# Patient Record
Sex: Male | Born: 1945 | Race: White | Hispanic: No | Marital: Married | Smoking: Never smoker
Health system: Southern US, Community
[De-identification: ages and names within clinical notes are randomized; demographics above are authoritative.]

## PROBLEM LIST (undated history)

## (undated) DIAGNOSIS — H409 Unspecified glaucoma: Secondary | ICD-10-CM

## (undated) DIAGNOSIS — C4490 Unspecified malignant neoplasm of skin, unspecified: Secondary | ICD-10-CM

## (undated) DIAGNOSIS — G4733 Obstructive sleep apnea (adult) (pediatric): Secondary | ICD-10-CM

## (undated) HISTORY — PX: VASECTOMY: SHX75

---

## 2013-12-07 ENCOUNTER — Emergency Department (HOSPITAL_COMMUNITY)
Admission: EM | Admit: 2013-12-07 | Discharge: 2013-12-07 | Disposition: A | Discharge status: Home / Self Care | Payer: Medicare HMO | Attending: Emergency Medicine | Admitting: Emergency Medicine

## 2013-12-07 ENCOUNTER — Encounter (HOSPITAL_COMMUNITY): Payer: Self-pay | Admitting: Emergency Medicine

## 2013-12-07 ENCOUNTER — Emergency Department (HOSPITAL_COMMUNITY): Payer: Medicare HMO

## 2013-12-07 ENCOUNTER — Emergency Department (HOSPITAL_COMMUNITY)
Admission: EM | Admit: 2013-12-07 | Discharge: 2013-12-07 | Disposition: A | Discharge status: Other Acute Inpatient Hospital | Payer: Medicare HMO | Source: Home / Self Care | Attending: Family Medicine | Admitting: Family Medicine

## 2013-12-07 DIAGNOSIS — S060X0A Concussion without loss of consciousness, initial encounter: Secondary | ICD-10-CM | POA: Insufficient documentation

## 2013-12-07 DIAGNOSIS — F29 Unspecified psychosis not due to a substance or known physiological condition: Secondary | ICD-10-CM | POA: Insufficient documentation

## 2013-12-07 DIAGNOSIS — S060X9A Concussion with loss of consciousness of unspecified duration, initial encounter: Secondary | ICD-10-CM

## 2013-12-07 DIAGNOSIS — Y929 Unspecified place or not applicable: Secondary | ICD-10-CM | POA: Insufficient documentation

## 2013-12-07 DIAGNOSIS — S0990XA Unspecified injury of head, initial encounter: Secondary | ICD-10-CM

## 2013-12-07 DIAGNOSIS — S060XAA Concussion with loss of consciousness status unknown, initial encounter: Secondary | ICD-10-CM

## 2013-12-07 DIAGNOSIS — Z7982 Long term (current) use of aspirin: Secondary | ICD-10-CM | POA: Insufficient documentation

## 2013-12-07 DIAGNOSIS — Z79899 Other long term (current) drug therapy: Secondary | ICD-10-CM | POA: Insufficient documentation

## 2013-12-07 DIAGNOSIS — Y939 Activity, unspecified: Secondary | ICD-10-CM | POA: Insufficient documentation

## 2013-12-07 DIAGNOSIS — W010XXA Fall on same level from slipping, tripping and stumbling without subsequent striking against object, initial encounter: Secondary | ICD-10-CM | POA: Insufficient documentation

## 2013-12-07 NOTE — ED Provider Notes (Signed)
CSN: [id number]     Arrival date & time 12/07/13  1132 History   First MD Initiated Contact with Patient 12/07/13 1154     Chief Complaint  Patient presents with  . Head Injury     (Consider location/radiation/quality/duration/timing/severity/associated sxs/prior Treatment) Patient is a 68 y.o. male presenting with head injury.  Head Injury Location:  Occipital Time since incident:  4 hours Mechanism of injury: fall   Mechanism of injury comment:  Slipped on ice Pain details:    Quality:  Dull   Severity:  Mild Chronicity:  New Relieved by:  Nothing Worsened by:  Nothing tried Associated symptoms comment:  Confusion, disorientation   History reviewed. No pertinent past medical history. History reviewed. No pertinent past surgical history. No family history on file. History  Substance Use Topics  . Smoking status: Never Smoker   . Smokeless tobacco: Not on file  . Alcohol Use: No    Review of Systems  All other systems reviewed and are negative.      Allergies  Review of patient's allergies indicates no known allergies.  Home Medications   Current Outpatient Rx  Name  Route  Sig  Dispense  Refill  . aspirin EC 81 MG tablet   Oral   Take 81 mg by mouth daily.         Marland Kitchen FIBER PO   Oral   Take 2 capsules by mouth daily.         . Glucosamine HCl (GLUCOSAMINE PO)   Oral   Take 2 capsules by mouth daily.         . Omega-3 Fatty Acids (FISH OIL PO)   Oral   Take 1 capsule by mouth daily.          BP 123/100  Pulse 62  Temp(Src) 98.3 F (36.8 C) (Oral)  Resp 18  Wt 277 lb 9.6 oz (125.919 kg)  SpO2 98% Physical Exam  Nursing note and vitals reviewed. Constitutional: He is oriented to person, place, and time. He appears well-developed and well-nourished. No distress.  HENT:  Head: Normocephalic and atraumatic. Head is without raccoon's eyes and without Battle's sign.  Nose: Nose normal.  Eyes: Conjunctivae and EOM are normal. Pupils are  equal, round, and reactive to light. No scleral icterus.  Neck: Normal range of motion and full passive range of motion without pain. No spinous process tenderness and no muscular tenderness present.  Cardiovascular: Normal rate, regular rhythm, normal heart sounds and intact distal pulses.   No murmur heard. Pulmonary/Chest: Effort normal and breath sounds normal. He has no rales. He exhibits no tenderness.  Abdominal: Soft. There is no tenderness. There is no rebound and no guarding.  Musculoskeletal: Normal range of motion. He exhibits no edema and no tenderness.       Thoracic back: He exhibits no tenderness and no bony tenderness.       Lumbar back: He exhibits no tenderness and no bony tenderness.  No evidence of trauma to extremities, except as noted.  2+ distal pulses.    Neurological: He is alert and oriented to person, place, and time. He has normal strength. GCS eye subscore is 4. GCS verbal subscore is 5. GCS motor subscore is 6.  Skin: Skin is warm and dry. No rash noted.  Psychiatric: He has a normal mood and affect.    ED Course  Procedures (including critical care time) Labs Review Labs Reviewed - No data to display Imaging Review Ct Cervical Spine Wo  Contrast  12/07/2013   CLINICAL DATA:  [place] and hit head  EXAM: CT HEAD WITHOUT CONTRAST  CT CERVICAL SPINE WITHOUT CONTRAST  TECHNIQUE: Multidetector CT imaging of the head and cervical spine was performed following the standard protocol without intravenous contrast. Multiplanar CT image reconstructions of the cervical spine were also generated.  COMPARISON:  None.  FINDINGS: CT HEAD FINDINGS  Mild atrophy. Negative for hydrocephalus. Negative for infarct, hemorrhage, or mass lesion.  Negative for skull fracture. Mucosal edema in the paranasal sinuses, Mild  CT CERVICAL SPINE FINDINGS  2 mm anterior slip C3-4 with disc and facet degeneration. Disc degeneration and spondylosis C5-6 and C6-7.  Negative for fracture.  IMPRESSION: No  acute intracranial abnormality.  Negative for cervical spine fracture.   Electronically Signed   By: Franchot Gallo M.D.   On: 12/07/2013 12:35  All radiology studies independently viewed by me.     EKG Interpretation  None  MDM   Final diagnoses:  Closed head injury  Concussion    68 yo male who slipped on ice and hit the back of his head.  Doesn't think he lost consciousness.  Was confused for a few hours after episode, but was able to walk around and drive home.  Back to normal normal.  Head and C spine CT negative.  Symptoms consistent with concussion.  Gave concussion precautions and return precautions.      Houston Siren III, MD 12/07/13 [phone]

## 2013-12-07 NOTE — ED Notes (Signed)
Reportedly slipped on ice and struck occipital area of head ~9:30. Arrived home tearful, asking wife "were do we live", could not remember her name (remembered ' pet' name for his wife), had been driving around for a while trying to find house. Abrasion to occipital area, no blood or fluid from ears or nose, EOM equal, grips equal, no soiling of garments

## 2013-12-07 NOTE — ED Provider Notes (Signed)
CSN: [id number]     Arrival date & time 12/07/13  1046 History   First MD Initiated Contact with Patient 12/07/13 1106     Chief Complaint  Patient presents with  . Head Injury   (Consider location/radiation/quality/duration/timing/severity/associated sxs/prior Treatment) HPI Comments: Patient brought to [facility] by his wife who provides some of the history as patient still mildly confused about details following his injury. Patient recalls going to their storage unit this morning and while he was there he slipped on patch of ice and "landed on my head." States he then remembers "driving around a while" because he could not find his house. States he was nauseated and upset. Wife reports he arrived home crying and could not recall her name or the name of his street, asking her "where do we live?" Has abrasion to occiput and some moderate neck discomfort. Last tetanus booster 2 years ago. PCP: Dr. Hulan Fess Denies any additional injury. Does take a daily aspirin, but no additional medications.  Patient is a 68 y.o. male presenting with head injury. The history is provided by the patient and the spouse.  Head Injury Location:  Occipital Associated symptoms: headache, nausea and neck pain     History reviewed. No pertinent past medical history. History reviewed. No pertinent past surgical history. History reviewed. No pertinent family history. History  Substance Use Topics  . Smoking status: Never Smoker   . Smokeless tobacco: Not on file  . Alcohol Use: No    Review of Systems  Constitutional: Negative.   Eyes: Negative for visual disturbance.  Respiratory: Negative.   Cardiovascular: Negative.   Gastrointestinal: Positive for nausea.  Endocrine: Negative.   Genitourinary: Negative.   Musculoskeletal: Positive for neck pain.  Skin:       +scalp abrasion  Neurological: Positive for headaches.  Psychiatric/Behavioral: Positive for confusion.    Allergies  Review of patient's  allergies indicates no known allergies.  Home Medications  No current outpatient prescriptions on file. BP 125/75  Pulse 56  Temp(Src) 97.4 F (36.3 C) (Oral)  Resp 20  SpO2 98% Physical Exam  Nursing note and vitals reviewed. Constitutional: He appears well-developed and well-nourished. No distress.  HENT:  Head: Normocephalic. Head is with abrasion.    Right Ear: External ear normal.  Left Ear: External ear normal.  Nose: Nose normal.  Mouth/Throat: Uvula is midline, oropharynx is clear and moist and mucous membranes are normal.  Eyes: Conjunctivae and EOM are normal. Pupils are equal, round, and reactive to light.  Neck: Trachea normal and phonation normal. Neck supple. Spinous process tenderness and muscular tenderness present.    Cardiovascular: Regular rhythm.  Bradycardia present.   Pulmonary/Chest: Effort normal and breath sounds normal.  Musculoskeletal: Normal range of motion.  Neurological: He is alert. He has normal strength. No cranial nerve deficit or sensory deficit. Coordination and gait normal. GCS eye subscore is 4. GCS verbal subscore is 5. GCS motor subscore is 6.  Skin: Skin is warm and dry.  Psychiatric: He has a normal mood and affect. His behavior is normal.    ED Course  Procedures (including critical care time) Labs Review Labs Reviewed - No data to display Imaging Review No results found.   MDM   1. Concussion   Transferred to [facility] ER in cervical collar for probable imaging of head and c-spine.    [place], [place] 12/07/13 1134

## 2013-12-07 NOTE — Discharge Instructions (Signed)
Concussion, Adult °A concussion, or closed-head injury, is a brain injury caused by a direct blow to the head or by a quick and sudden movement (jolt) of the head or neck. Concussions are usually not life-threatening. Even so, the effects of a concussion can be serious. If you have had a concussion before, you are more likely to experience concussion-like symptoms after a direct blow to the head.  °CAUSES  °· Direct blow to the head, such as from running into another player during a soccer game, being hit in a fight, or hitting your head on a hard surface. °· A jolt of the head or neck that causes the brain to move back and forth inside the skull, such as in a car crash. °SIGNS AND SYMPTOMS  °The signs of a concussion can be hard to notice. Early on, they may be missed by you, family members, and health care providers. You may look fine but act or feel differently. °Symptoms are usually temporary, but they may last for days, weeks, or even longer. Some symptoms may appear right away while others may not show up for hours or days. Every head injury is different. Symptoms include:  °· Mild to moderate headaches that will not go away. °· A feeling of pressure inside your head.  °· Having more trouble than usual:   °· Learning or remembering things you have heard. °· Answering questions.  °· Paying attention or concentrating.   °· Organizing daily tasks.   °· Making decisions and solving problems.   °· Slowness in thinking, acting or reacting, speaking, or reading.   °· Getting lost or being easily confused.   °· Feeling tired all the time or lacking energy (fatigued).   °· Feeling drowsy.   °· Sleep disturbances.   °· Sleeping more than usual.   °· Sleeping less than usual.   °· Trouble falling asleep.   °· Trouble sleeping (insomnia).   °· Loss of balance or feeling lightheaded or dizzy.   °· Nausea or vomiting.   °· Numbness or tingling.   °· Increased sensitivity to:   °· Sounds.   °· Lights.   °· Distractions.    °· Vision problems or eyes that tire easily.   °· Diminished sense of taste or smell.   °· Ringing in the ears.   °· Mood changes such as feeling sad or anxious.   °· Becoming easily irritated or angry for little or no reason.   °· Lack of motivation. °· Seeing or hearing things other people do not see or hear (hallucinations). °DIAGNOSIS  °Your health care provider can usually diagnose a concussion based on a description of your injury and symptoms. He or she will ask whether you passed out (lost consciousness) and whether you are having trouble remembering events that happened right before and during your injury.  °Your evaluation might include:  °· A brain scan to look for signs of injury to the brain. Even if the test shows no injury, you may still have a concussion.   °· Blood tests to be sure other problems are not present. °TREATMENT  °· Concussions are usually treated in an emergency department, in urgent care, or at a clinic. You may need to stay in the hospital overnight for further treatment.   °· Tell your health care provider if you are taking any medicines, including prescription medicines, over-the-counter medicines, and natural remedies. Some medicines, such as blood thinners (anticoagulants) and aspirin, may increase the chance of complications. Also tell your health care provider whether you have had alcohol or are taking illegal drugs. This information may affect treatment. °· Your health care provider will send you   home with important instructions to follow.  How fast you will recover from a concussion depends on many factors. These factors include how severe your concussion is, what part of your brain was injured, your age, and how healthy you were before the concussion.  Most people with mild injuries recover fully. Recovery can take time. In general, recovery is slower in older persons. Also, persons who have had a concussion in the past or have other medical problems may find that it  takes longer to recover from their current injury. HOME CARE INSTRUCTIONS  General Instructions  Carefully follow the directions your health care provider gave you.  Only take over-the-counter or prescription medicines for pain, discomfort, or fever as directed by your health care provider.  Take only those medicines that your health care provider has approved.  Do not drink alcohol until your health care provider says you are well enough to do so. Alcohol and certain other drugs may slow your recovery and can put you at risk of further injury.  If it is harder than usual to remember things, write them down.  If you are easily distracted, try to do one thing at a time. For example, do not try to watch TV while fixing dinner.  Talk with family members or close friends when making important decisions.  Keep all follow-up appointments. Repeated evaluation of your symptoms is recommended for your recovery.  Watch your symptoms and tell others to do the same. Complications sometimes occur after a concussion. Older adults with a brain injury may have a higher risk of serious complications such as of a blood clot on the brain.  Tell your teachers, school nurse, school counselor, coach, athletic trainer, or work Freight forwarder about your injury, symptoms, and restrictions. Tell them about what you can or cannot do. They should watch for:   Increased problems with attention or concentration.   Increased difficulty remembering or learning new information.   Increased time needed to complete tasks or assignments.   Increased irritability or decreased ability to cope with stress.   Increased symptoms.   Rest. Rest helps the brain to heal. Make sure you:  Get plenty of sleep at night. Avoid staying up late at night.  Keep the same bedtime hours on weekends and weekdays.  Rest during the day. Take daytime naps or rest breaks when you feel tired.  Limit activities that require a lot of  thought or concentration. These includes   Doing homework or job-related work.   Watching TV.   Working on the computer.  Avoid any situation where there is potential for another head injury (football, hockey, soccer, basketball, martial arts, downhill snow sports and horseback riding). Your condition will get worse every time you experience a concussion. You should avoid these activities until you are evaluated by the appropriate follow-up caregivers. Returning To Your Regular Activities You will need to return to your normal activities slowly, not all at once. You must give your body and brain enough time for recovery.  Do not return to sports or other athletic activities until your health care provider tells you it is safe to do so.  Ask your health care provider when you can drive, ride a bicycle, or operate heavy machinery. Your ability to react may be slower after a brain injury. Never do these activities if you are dizzy.  Ask your health care provider about when you can return to work or school. Preventing Another Concussion It is very important to avoid another  brain injury, especially before you have recovered. In rare cases, another injury can lead to permanent brain damage, brain swelling, or death. The risk of this is greatest during the first 7 10 days after a head injury. Avoid injuries by:   Wearing a seat belt when riding in a car.   Drinking alcohol only in moderation.   Wearing a helmet when biking, skiing, skateboarding, skating, or doing similar activities.  Avoiding activities that could lead to a second concussion, such as contact or recreational sports, until your health care provider says it is OK.  Taking safety measures in your home.   Remove clutter and tripping hazards from floors and stairways.   Use grab bars in bathrooms and handrails by stairs.   Place non-slip mats on floors and in bathtubs.   Improve lighting in dim areas. SEEK MEDICAL  CARE IF:   You have increased problems paying attention or concentrating.   You have increased difficulty remembering or learning new information.   You need more time to complete tasks or assignments than before.   You have increased irritability or decreased ability to cope with stress.  You have more symptoms than before. Seek medical care if you have any of the following symptoms for more than 2 weeks after your injury:   Lasting (chronic) headaches.   Dizziness or balance problems.   Nausea.  Vision problems.   Increased sensitivity to noise or light.   Depression or mood swings.   Anxiety or irritability.   Memory problems.   Difficulty concentrating or paying attention.   Sleep problems.   Feeling tired all the time. SEEK IMMEDIATE MEDICAL CARE IF:   You have severe or worsening headaches. These may be a sign of a blood clot in the brain.  You have weakness (even if only in one hand, leg, or part of the face).  You have numbness.  You have decreased coordination.   You vomit repeatedly.  You have increased sleepiness.  One pupil is larger than the other.   You have convulsions.   You have slurred speech.   You have increased confusion. This may be a sign of a blood clot in the brain.  You have increased restlessness, agitation, or irritability.   You are unable to recognize people or places.   You have neck pain.   It is difficult to wake you up.   You have unusual behavior changes.   You lose consciousness. MAKE SURE YOU:   Understand these instructions.  Will watch your condition.  Will get help right away if you are not doing well or get worse. Document Released: 12/18/2003 Document Revised: 05/30/2013 Document Reviewed: 04/19/2013 [facility] Patient Information 2014 [place], [place].  Head Injury, Adult You have received a head injury. It does not appear serious at this time. Headaches and vomiting are common  following head injury. It should be easy to awaken from sleeping. Sometimes it is necessary for you to stay in the emergency department for a while for observation. Sometimes admission to the hospital may be needed. After injuries such as yours, most problems occur within the first 24 hours, but side effects may occur up to 7 10 days after the injury. It is important for you to carefully monitor your condition and contact your health care provider or seek immediate medical care if there is a change in your condition. WHAT ARE THE TYPES OF HEAD INJURIES? Head injuries can be as minor as a bump. Some head injuries can  be more severe. More severe head injuries include:  A jarring injury to the brain (concussion).  A bruise of the brain (contusion). This mean there is bleeding in the brain that can cause swelling.  A cracked skull (skull fracture).  Bleeding in the brain that collects, clots, and forms a bump (hematoma). WHAT CAUSES A HEAD INJURY? A serious head injury is most likely to happen to someone who is in a car wreck and is not wearing a seat belt. Other causes of major head injuries include bicycle or motorcycle accidents, sports injuries, and falls. HOW ARE HEAD INJURIES DIAGNOSED? A complete history of the event leading to the injury and your current symptoms will be helpful in diagnosing head injuries. Many times, pictures of the brain, such as CT or MRI are needed to see the extent of the injury. Often, an overnight hospital stay is necessary for observation.  WHEN SHOULD I SEEK IMMEDIATE MEDICAL CARE?  You should get help right away if:  You have confusion or drowsiness.  You feel sick to your stomach (nauseous) or have continued, forceful vomiting.  You have dizziness or unsteadiness that is getting worse.  You have severe, continued headaches not relieved by medicine. Only take over-the-counter or prescription medicines for pain, fever, or discomfort as directed by your health  care provider.  You do not have normal function of the arms or legs or are unable to walk.  You notice changes in the black spots in the center of the colored part of your eye (pupil).  You have a clear or bloody fluid coming from your nose or ears.  You have a loss of vision. During the next 24 hours after the injury, you must stay with someone who can watch you for the warning signs. This person should contact local emergency services (911 in the [place] if you have seizures, you become unconscious, or you are unable to wake up. HOW CAN I PREVENT A HEAD INJURY IN THE FUTURE? The most important factor for preventing major head injuries is avoiding motor vehicle accidents. To minimize the potential for damage to your head, it is crucial to wear seat belts while riding in motor vehicles. Wearing helmets while bike riding and playing collision sports (like football) is also helpful. Also, avoiding dangerous activities around the house will further help reduce your risk of head injury.  WHEN CAN I RETURN TO NORMAL ACTIVITIES AND ATHLETICS? You should be reevaluated by your health care provider before returning to these activities. If you have any of the following symptoms, you should not return to activities or contact sports until 1 week after the symptoms have stopped:  Persistent headache.  Dizziness or vertigo.  Poor attention and concentration.  Confusion.  Memory problems.  Nausea or vomiting.  Fatigue or tire easily.  Irritability.  Intolerant of bright lights or loud noises.  Anxiety or depression.  Disturbed sleep. MAKE SURE YOU:   Understand these instructions.  Will watch your condition.  Will get help right away if you are not doing well or get worse. Document Released: 09/27/2005 Document Revised: 07/18/2013 Document Reviewed: 06/04/2013 [facility] Patient Information 2014 [place].

## 2013-12-07 NOTE — ED Notes (Signed)
Pt sent here for altered mental status after slipping on ice and hitting back of head.  Pt some how was able to drive back home even though he asked wife what street they lived on and could not remember wife's names.  PT now able to state wife's name correctly.  Pt c/o neck pain only at this time.

## 2013-12-07 NOTE — ED Notes (Signed)
Patient transported to CT 

## 2013-12-09 NOTE — ED Provider Notes (Signed)
Medical screening examination/treatment/procedure(s) were performed by a resident physician or non-physician practitioner and as the supervising physician I was immediately available for consultation/collaboration.  Tiannah Greenly, MD    Maliah Pyles S Crystle Carelli, MD 12/09/13 0838 

## 2016-02-09 DIAGNOSIS — M150 Primary generalized (osteo)arthritis: Secondary | ICD-10-CM | POA: Diagnosis not present

## 2016-02-09 DIAGNOSIS — Z0000 Encounter for general adult medical examination without abnormal findings: Secondary | ICD-10-CM | POA: Diagnosis not present

## 2016-02-09 DIAGNOSIS — Z23 Encounter for immunization: Secondary | ICD-10-CM | POA: Diagnosis not present

## 2016-02-09 DIAGNOSIS — E7800 Pure hypercholesterolemia, unspecified: Secondary | ICD-10-CM | POA: Diagnosis not present

## 2016-02-09 DIAGNOSIS — N529 Male erectile dysfunction, unspecified: Secondary | ICD-10-CM | POA: Diagnosis not present

## 2016-02-25 DIAGNOSIS — H903 Sensorineural hearing loss, bilateral: Secondary | ICD-10-CM | POA: Diagnosis not present

## 2016-03-29 DIAGNOSIS — H2513 Age-related nuclear cataract, bilateral: Secondary | ICD-10-CM | POA: Diagnosis not present

## 2016-03-29 DIAGNOSIS — H5213 Myopia, bilateral: Secondary | ICD-10-CM | POA: Diagnosis not present

## 2016-03-29 DIAGNOSIS — H524 Presbyopia: Secondary | ICD-10-CM | POA: Diagnosis not present

## 2016-03-29 DIAGNOSIS — H43813 Vitreous degeneration, bilateral: Secondary | ICD-10-CM | POA: Diagnosis not present

## 2016-04-07 DIAGNOSIS — H903 Sensorineural hearing loss, bilateral: Secondary | ICD-10-CM | POA: Diagnosis not present

## 2016-04-20 DIAGNOSIS — D1801 Hemangioma of skin and subcutaneous tissue: Secondary | ICD-10-CM | POA: Diagnosis not present

## 2016-04-20 DIAGNOSIS — L570 Actinic keratosis: Secondary | ICD-10-CM | POA: Diagnosis not present

## 2016-04-20 DIAGNOSIS — C44319 Basal cell carcinoma of skin of other parts of face: Secondary | ICD-10-CM | POA: Diagnosis not present

## 2016-04-20 DIAGNOSIS — D485 Neoplasm of uncertain behavior of skin: Secondary | ICD-10-CM | POA: Diagnosis not present

## 2016-04-20 DIAGNOSIS — L821 Other seborrheic keratosis: Secondary | ICD-10-CM | POA: Diagnosis not present

## 2016-06-17 DIAGNOSIS — M545 Low back pain: Secondary | ICD-10-CM | POA: Diagnosis not present

## 2016-06-17 DIAGNOSIS — M9903 Segmental and somatic dysfunction of lumbar region: Secondary | ICD-10-CM | POA: Diagnosis not present

## 2016-07-23 DIAGNOSIS — M545 Low back pain: Secondary | ICD-10-CM | POA: Diagnosis not present

## 2016-07-23 DIAGNOSIS — M9903 Segmental and somatic dysfunction of lumbar region: Secondary | ICD-10-CM | POA: Diagnosis not present

## 2016-08-30 DIAGNOSIS — M545 Low back pain: Secondary | ICD-10-CM | POA: Diagnosis not present

## 2016-08-30 DIAGNOSIS — M9903 Segmental and somatic dysfunction of lumbar region: Secondary | ICD-10-CM | POA: Diagnosis not present

## 2016-09-21 DIAGNOSIS — M545 Low back pain: Secondary | ICD-10-CM | POA: Diagnosis not present

## 2016-09-21 DIAGNOSIS — M9903 Segmental and somatic dysfunction of lumbar region: Secondary | ICD-10-CM | POA: Diagnosis not present

## 2016-10-18 DIAGNOSIS — M9903 Segmental and somatic dysfunction of lumbar region: Secondary | ICD-10-CM | POA: Diagnosis not present

## 2016-10-18 DIAGNOSIS — M545 Low back pain: Secondary | ICD-10-CM | POA: Diagnosis not present

## 2016-11-18 DIAGNOSIS — M160 Bilateral primary osteoarthritis of hip: Secondary | ICD-10-CM | POA: Diagnosis not present

## 2016-11-18 DIAGNOSIS — M7062 Trochanteric bursitis, left hip: Secondary | ICD-10-CM | POA: Diagnosis not present

## 2016-11-18 DIAGNOSIS — M25552 Pain in left hip: Secondary | ICD-10-CM | POA: Diagnosis not present

## 2016-12-17 DIAGNOSIS — M9903 Segmental and somatic dysfunction of lumbar region: Secondary | ICD-10-CM | POA: Diagnosis not present

## 2016-12-17 DIAGNOSIS — M545 Low back pain: Secondary | ICD-10-CM | POA: Diagnosis not present

## 2017-01-24 DIAGNOSIS — M9903 Segmental and somatic dysfunction of lumbar region: Secondary | ICD-10-CM | POA: Diagnosis not present

## 2017-01-24 DIAGNOSIS — M545 Low back pain: Secondary | ICD-10-CM | POA: Diagnosis not present

## 2017-02-14 DIAGNOSIS — E669 Obesity, unspecified: Secondary | ICD-10-CM | POA: Diagnosis not present

## 2017-02-14 DIAGNOSIS — Z1211 Encounter for screening for malignant neoplasm of colon: Secondary | ICD-10-CM | POA: Diagnosis not present

## 2017-02-14 DIAGNOSIS — Z0000 Encounter for general adult medical examination without abnormal findings: Secondary | ICD-10-CM | POA: Diagnosis not present

## 2017-02-14 DIAGNOSIS — Z23 Encounter for immunization: Secondary | ICD-10-CM | POA: Diagnosis not present

## 2017-02-14 DIAGNOSIS — E7800 Pure hypercholesterolemia, unspecified: Secondary | ICD-10-CM | POA: Diagnosis not present

## 2017-02-14 DIAGNOSIS — G4719 Other hypersomnia: Secondary | ICD-10-CM | POA: Diagnosis not present

## 2017-02-14 DIAGNOSIS — M25552 Pain in left hip: Secondary | ICD-10-CM | POA: Diagnosis not present

## 2017-02-14 DIAGNOSIS — Z125 Encounter for screening for malignant neoplasm of prostate: Secondary | ICD-10-CM | POA: Diagnosis not present

## 2017-02-28 DIAGNOSIS — M545 Low back pain: Secondary | ICD-10-CM | POA: Diagnosis not present

## 2017-02-28 DIAGNOSIS — M9903 Segmental and somatic dysfunction of lumbar region: Secondary | ICD-10-CM | POA: Diagnosis not present

## 2017-03-01 ENCOUNTER — Encounter (HOSPITAL_BASED_OUTPATIENT_CLINIC_OR_DEPARTMENT_OTHER): Payer: Self-pay

## 2017-03-01 DIAGNOSIS — R0683 Snoring: Secondary | ICD-10-CM

## 2017-03-01 DIAGNOSIS — G4710 Hypersomnia, unspecified: Secondary | ICD-10-CM

## 2017-04-01 DIAGNOSIS — H524 Presbyopia: Secondary | ICD-10-CM | POA: Diagnosis not present

## 2017-04-01 DIAGNOSIS — H40013 Open angle with borderline findings, low risk, bilateral: Secondary | ICD-10-CM | POA: Diagnosis not present

## 2017-04-01 DIAGNOSIS — H2513 Age-related nuclear cataract, bilateral: Secondary | ICD-10-CM | POA: Diagnosis not present

## 2017-04-01 DIAGNOSIS — H43813 Vitreous degeneration, bilateral: Secondary | ICD-10-CM | POA: Diagnosis not present

## 2017-04-06 ENCOUNTER — Ambulatory Visit (HOSPITAL_BASED_OUTPATIENT_CLINIC_OR_DEPARTMENT_OTHER): Payer: PPO | Attending: Family Medicine | Admitting: Internal Medicine

## 2017-04-06 VITALS — Ht 74.0 in | Wt 279.0 lb

## 2017-04-06 DIAGNOSIS — G4733 Obstructive sleep apnea (adult) (pediatric): Secondary | ICD-10-CM

## 2017-04-06 DIAGNOSIS — G4710 Hypersomnia, unspecified: Secondary | ICD-10-CM

## 2017-04-06 DIAGNOSIS — R0683 Snoring: Secondary | ICD-10-CM

## 2017-04-13 DIAGNOSIS — R0683 Snoring: Secondary | ICD-10-CM | POA: Diagnosis not present

## 2017-04-13 NOTE — Procedures (Signed)
   Patient Name: Javier Peterson, Javier Peterson Date: 04/06/2017 Gender: Male D.O.B: 1946-08-20 Age (years): 70 Referring Provider: Chesley Noon Height (inches): 74 Interpreting Physician: Baird Lyons MD, ABSM Weight (lbs): 279 RPSGT: Laren Everts BMI: 36 MRN: [id number] Neck Size: 19.00 CLINICAL INFORMATION Sleep Study Type: NPSG  Indication for sleep study: Excessive Daytime Sleepiness, Obesity, Snoring, Witnessed Apneas  Epworth Sleepiness Score: 13  SLEEP STUDY TECHNIQUE As per the AASM Manual for the Scoring of Sleep and Associated Events v2.3 (April 2016) with a hypopnea requiring 4% desaturations.  The channels recorded and monitored were frontal, central and occipital EEG, electrooculogram (EOG), submentalis EMG (chin), nasal and oral airflow, thoracic and abdominal wall motion, anterior tibialis EMG, snore microphone, electrocardiogram, and pulse oximetry.  MEDICATIONS Medications self-administered by patient taken the night of the study : GLUCOSAMINE, ASPIRIN, [place] The study was initiated at 10:03:39 PM and ended at 4:34:39 AM.  Sleep onset time was 55.1 minutes and the sleep efficiency was 72.3%. The total sleep time was 282.5 minutes.  Stage REM latency was 132.5 minutes.  The patient spent 22.83% of the night in stage N1 sleep, 58.23% in stage N2 sleep, 0.00% in stage N3 and 18.94% in REM.  Alpha intrusion was absent.  Supine sleep was 26.34%.  RESPIRATORY PARAMETERS The overall apnea/hypopnea index (AHI) was 25.5 per hour. There were 37 total apneas, including 16 obstructive, 14 central and 7 mixed apneas. There were 83 hypopneas and 56 RERAs.  The AHI during Stage REM sleep was 31.4 per hour.  AHI while supine was 62.9 per hour.  The mean oxygen saturation was 92.54%. The minimum SpO2 during sleep was 81.00%.  Loud snoring was noted during this study.  CARDIAC DATA The 2 lead EKG demonstrated sinus rhythm. The mean  heart rate was 51.69 beats per minute. Other EKG findings include: PVCs.  LEG MOVEMENT DATA The total PLMS were 0 with a resulting PLMS index of 0.00. Associated arousal with leg movement index was 0.0 .  IMPRESSIONS - Moderate obstructive sleep apnea occurred during this study (AHI = 25.5/h). - No significant central sleep apnea occurred during this study (CAI = 3.0/h). - Moderate oxygen desaturation was noted during this study (Min O2 = 81.00%). - The patient snored with Loud snoring volume. - EKG findings include PVCs. - Clinically significant periodic limb movements did not occur during sleep. No significant associated arousals.  DIAGNOSIS - Obstructive Sleep Apnea (327.23 [G47.33 ICD-10]  RECOMMENDATIONS - Therapeutic CPAP titration to determine optimal pressure required to alleviate sleep disordered breathing. Other options based on clinical judgment. - Positional therapy avoiding supine position during sleep. - Be careful with alcohol, sedatives and other CNS depressants that may worsen sleep apnea and disrupt normal sleep architecture. - Sleep hygiene should be reviewed to assess factors that may improve sleep quality. - Weight management and regular exercise should be initiated or continued if appropriate.  [Electronically signed] 04/13/2017 10:26 AM  Baird Lyons MD, ABSM Diplomate, American Board of Sleep Medicine   NPI: [id number] [place], American Board of Sleep Medicine  ELECTRONICALLY SIGNED ON:  04/13/2017, 10:24 AM [place] PH: [phone]   FX: [phone] [place]

## 2017-04-25 DIAGNOSIS — L821 Other seborrheic keratosis: Secondary | ICD-10-CM | POA: Diagnosis not present

## 2017-04-25 DIAGNOSIS — Z85828 Personal history of other malignant neoplasm of skin: Secondary | ICD-10-CM | POA: Diagnosis not present

## 2017-04-25 DIAGNOSIS — L570 Actinic keratosis: Secondary | ICD-10-CM | POA: Diagnosis not present

## 2017-05-04 DIAGNOSIS — H2513 Age-related nuclear cataract, bilateral: Secondary | ICD-10-CM | POA: Diagnosis not present

## 2017-05-04 DIAGNOSIS — H40013 Open angle with borderline findings, low risk, bilateral: Secondary | ICD-10-CM | POA: Diagnosis not present

## 2017-05-20 ENCOUNTER — Other Ambulatory Visit (HOSPITAL_BASED_OUTPATIENT_CLINIC_OR_DEPARTMENT_OTHER): Payer: Self-pay

## 2017-05-20 DIAGNOSIS — R0683 Snoring: Secondary | ICD-10-CM

## 2017-05-20 DIAGNOSIS — R5383 Other fatigue: Secondary | ICD-10-CM

## 2017-05-20 DIAGNOSIS — G4710 Hypersomnia, unspecified: Secondary | ICD-10-CM

## 2017-05-20 DIAGNOSIS — G4730 Sleep apnea, unspecified: Secondary | ICD-10-CM

## 2017-05-22 ENCOUNTER — Ambulatory Visit (HOSPITAL_BASED_OUTPATIENT_CLINIC_OR_DEPARTMENT_OTHER): Payer: PPO | Attending: Family Medicine | Admitting: Internal Medicine

## 2017-05-22 VITALS — Ht 74.0 in | Wt 274.0 lb

## 2017-05-22 DIAGNOSIS — R400 Somnolence: Secondary | ICD-10-CM | POA: Diagnosis not present

## 2017-05-22 DIAGNOSIS — G4730 Sleep apnea, unspecified: Secondary | ICD-10-CM

## 2017-05-22 DIAGNOSIS — G4733 Obstructive sleep apnea (adult) (pediatric): Secondary | ICD-10-CM

## 2017-05-22 DIAGNOSIS — R5383 Other fatigue: Secondary | ICD-10-CM | POA: Diagnosis not present

## 2017-05-22 DIAGNOSIS — R0683 Snoring: Secondary | ICD-10-CM | POA: Diagnosis not present

## 2017-05-22 DIAGNOSIS — G4710 Hypersomnia, unspecified: Secondary | ICD-10-CM

## 2017-05-29 DIAGNOSIS — R0683 Snoring: Secondary | ICD-10-CM

## 2017-05-29 NOTE — Procedures (Signed)
  Patient Name: Javier Peterson, Javier Peterson Date: 05/22/2017 Gender: Male D.O.B: October 14, 1945 Age (years): 69 Referring Provider: Chesley Noon Height (inches): 74 Interpreting Physician: Baird Lyons MD, ABSM Weight (lbs): 279 RPSGT: Madelon Lips BMI: 36 MRN: [id number] Neck Size: 19.00 CLINICAL INFORMATION The patient is referred for a CPAP titration to treat sleep apnea. Date of NPSG, Split Night or HST: Diagnostic NPSG 04/06/17- AHI 25.5/ hr, desaturation to  81%, body weight 279 lbs  SLEEP STUDY TECHNIQUE As per the AASM Manual for the Scoring of Sleep and Associated Events v2.3 (April 2016) with a hypopnea requiring 4% desaturations.  The channels recorded and monitored were frontal, central and occipital EEG, electrooculogram (EOG), submentalis EMG (chin), nasal and oral airflow, thoracic and abdominal wall motion, anterior tibialis EMG, snore microphone, electrocardiogram, and pulse oximetry. Continuous positive airway pressure (CPAP) was initiated at the beginning of the study and titrated to treat sleep-disordered breathing.  MEDICATIONS Medications self-administered by patient taken the night of the study : GLUCOSAMINE, ASPIRIN, BENADRYL, KLONOPIN  TECHNICIAN COMMENTS Comments added by technician: NO BATHROOM BREAKS TAKEN. PT TOLERATED CPAP WITHOUT DIFFICULTY  Comments added by scorer: N/A RESPIRATORY PARAMETERS Optimal PAP Pressure (cm): 11 AHI at Optimal Pressure (/hr): 2.3 Overall Minimal O2 (%): 86.00 Supine % at Optimal Pressure (%): 50 Minimal O2 at Optimal Pressure (%): 88.0    SLEEP ARCHITECTURE The study was initiated at 10:23:38 PM and ended at 4:56:38 AM.  Sleep onset time was 1.3 minutes and the sleep efficiency was 90.1%. The total sleep time was 354.0 minutes.  The patient spent 7.34% of the night in stage N1 sleep, 59.75% in stage N2 sleep, 0.00% in stage N3 and 32.91% in REM.Stage REM latency was 81.0 minutes  Wake after sleep onset was 37.7. Alpha  intrusion was absent. Supine sleep was 45.67%.  CARDIAC DATA The 2 lead EKG demonstrated sinus rhythm. The mean heart rate was 49.37 beats per minute. Other EKG findings include: None.  LEG MOVEMENT DATA The total Periodic Limb Movements of Sleep (PLMS) were 0. The PLMS index was 0.00. A PLMS index of <15 is considered normal in adults.  IMPRESSIONS - The optimal PAP pressure was 11 cm of water. - Central sleep apnea was not noted during this titration (CAI = 1.0/h). - Moderate oxygen desaturations were observed during this titration (min O2 = 86.00%, Mean 92%). - The patient snored with Loud snoring volume during this titration study. - Sinus bradycardia - mean HR 49/ minute. - Clinically significant periodic limb movements were not noted during this study. Arousals associated with PLMs were rare.  DIAGNOSIS - Obstructive Sleep Apnea (327.23 [G47.33 ICD-10])  RECOMMENDATIONS - Trial of CPAP therapy on 11 cm H2O with a Medium size Fisher&Paykel Full Face Mask Simplus mask and heated humidification. - Sleep hygiene should be reviewed to assess factors that may improve sleep quality. - Weight management and regular exercise should be initiated or continued.  [Electronically signed] 05/29/2017 10:25 AM  Baird Lyons MD, ABSM Diplomate, American Board of Sleep Medicine   NPI: [id number]  [place], American Board of Sleep Medicine  ELECTRONICALLY SIGNED ON:  05/29/2017, 10:19 AM [facility] PH: [phone]   FX: [phone] [place]

## 2017-06-16 DIAGNOSIS — H25811 Combined forms of age-related cataract, right eye: Secondary | ICD-10-CM | POA: Diagnosis not present

## 2017-06-16 DIAGNOSIS — H2511 Age-related nuclear cataract, right eye: Secondary | ICD-10-CM | POA: Diagnosis not present

## 2017-06-16 DIAGNOSIS — H25041 Posterior subcapsular polar age-related cataract, right eye: Secondary | ICD-10-CM | POA: Diagnosis not present

## 2017-06-21 ENCOUNTER — Telehealth: Payer: Self-pay | Admitting: Internal Medicine

## 2017-06-21 NOTE — Telephone Encounter (Signed)
Called Javier Peterson from [place] and advised her that the ordering provider should order CPAP and I explained that we only read the sleep studies. She understood and nothing further is needed.

## 2017-06-30 DIAGNOSIS — H2512 Age-related nuclear cataract, left eye: Secondary | ICD-10-CM | POA: Diagnosis not present

## 2017-06-30 DIAGNOSIS — H25012 Cortical age-related cataract, left eye: Secondary | ICD-10-CM | POA: Diagnosis not present

## 2017-06-30 DIAGNOSIS — H25812 Combined forms of age-related cataract, left eye: Secondary | ICD-10-CM | POA: Diagnosis not present

## 2017-07-20 DIAGNOSIS — G4733 Obstructive sleep apnea (adult) (pediatric): Secondary | ICD-10-CM | POA: Diagnosis not present

## 2017-07-20 DIAGNOSIS — H409 Unspecified glaucoma: Secondary | ICD-10-CM | POA: Diagnosis not present

## 2017-07-20 DIAGNOSIS — Z6835 Body mass index (BMI) 35.0-35.9, adult: Secondary | ICD-10-CM | POA: Diagnosis not present

## 2017-07-20 DIAGNOSIS — Z125 Encounter for screening for malignant neoplasm of prostate: Secondary | ICD-10-CM | POA: Diagnosis not present

## 2017-07-25 DIAGNOSIS — Z86010 Personal history of colonic polyps: Secondary | ICD-10-CM | POA: Diagnosis not present

## 2017-07-25 DIAGNOSIS — K5730 Diverticulosis of large intestine without perforation or abscess without bleeding: Secondary | ICD-10-CM | POA: Diagnosis not present

## 2017-07-25 DIAGNOSIS — K5900 Constipation, unspecified: Secondary | ICD-10-CM | POA: Diagnosis not present

## 2017-08-02 DIAGNOSIS — G4733 Obstructive sleep apnea (adult) (pediatric): Secondary | ICD-10-CM | POA: Diagnosis not present

## 2017-08-04 DIAGNOSIS — K635 Polyp of colon: Secondary | ICD-10-CM | POA: Diagnosis not present

## 2017-08-04 DIAGNOSIS — K5730 Diverticulosis of large intestine without perforation or abscess without bleeding: Secondary | ICD-10-CM | POA: Diagnosis not present

## 2017-08-04 DIAGNOSIS — Z86010 Personal history of colonic polyps: Secondary | ICD-10-CM | POA: Diagnosis not present

## 2017-08-04 DIAGNOSIS — D124 Benign neoplasm of descending colon: Secondary | ICD-10-CM | POA: Diagnosis not present

## 2017-08-04 DIAGNOSIS — D123 Benign neoplasm of transverse colon: Secondary | ICD-10-CM | POA: Diagnosis not present

## 2017-09-02 DIAGNOSIS — G4733 Obstructive sleep apnea (adult) (pediatric): Secondary | ICD-10-CM | POA: Diagnosis not present

## 2017-09-06 DIAGNOSIS — G4733 Obstructive sleep apnea (adult) (pediatric): Secondary | ICD-10-CM | POA: Diagnosis not present

## 2017-09-12 DIAGNOSIS — M545 Low back pain: Secondary | ICD-10-CM | POA: Diagnosis not present

## 2017-09-12 DIAGNOSIS — M9903 Segmental and somatic dysfunction of lumbar region: Secondary | ICD-10-CM | POA: Diagnosis not present

## 2017-09-22 DIAGNOSIS — N529 Male erectile dysfunction, unspecified: Secondary | ICD-10-CM | POA: Diagnosis not present

## 2017-09-22 DIAGNOSIS — Z6836 Body mass index (BMI) 36.0-36.9, adult: Secondary | ICD-10-CM | POA: Diagnosis not present

## 2017-09-22 DIAGNOSIS — G4733 Obstructive sleep apnea (adult) (pediatric): Secondary | ICD-10-CM | POA: Diagnosis not present

## 2017-09-22 DIAGNOSIS — H409 Unspecified glaucoma: Secondary | ICD-10-CM | POA: Diagnosis not present

## 2017-10-02 DIAGNOSIS — G4733 Obstructive sleep apnea (adult) (pediatric): Secondary | ICD-10-CM | POA: Diagnosis not present

## 2017-10-13 DIAGNOSIS — M9903 Segmental and somatic dysfunction of lumbar region: Secondary | ICD-10-CM | POA: Diagnosis not present

## 2017-10-13 DIAGNOSIS — M545 Low back pain: Secondary | ICD-10-CM | POA: Diagnosis not present

## 2017-11-02 DIAGNOSIS — G4733 Obstructive sleep apnea (adult) (pediatric): Secondary | ICD-10-CM | POA: Diagnosis not present

## 2017-11-17 DIAGNOSIS — M9903 Segmental and somatic dysfunction of lumbar region: Secondary | ICD-10-CM | POA: Diagnosis not present

## 2017-11-17 DIAGNOSIS — M545 Low back pain: Secondary | ICD-10-CM | POA: Diagnosis not present

## 2017-12-03 DIAGNOSIS — G4733 Obstructive sleep apnea (adult) (pediatric): Secondary | ICD-10-CM | POA: Diagnosis not present

## 2017-12-15 DIAGNOSIS — M545 Low back pain: Secondary | ICD-10-CM | POA: Diagnosis not present

## 2017-12-15 DIAGNOSIS — M9903 Segmental and somatic dysfunction of lumbar region: Secondary | ICD-10-CM | POA: Diagnosis not present

## 2017-12-31 DIAGNOSIS — G4733 Obstructive sleep apnea (adult) (pediatric): Secondary | ICD-10-CM | POA: Diagnosis not present

## 2018-01-05 DIAGNOSIS — G4733 Obstructive sleep apnea (adult) (pediatric): Secondary | ICD-10-CM | POA: Diagnosis not present

## 2018-01-09 DIAGNOSIS — M9903 Segmental and somatic dysfunction of lumbar region: Secondary | ICD-10-CM | POA: Diagnosis not present

## 2018-01-09 DIAGNOSIS — M545 Low back pain: Secondary | ICD-10-CM | POA: Diagnosis not present

## 2018-01-23 DIAGNOSIS — G4733 Obstructive sleep apnea (adult) (pediatric): Secondary | ICD-10-CM | POA: Diagnosis not present

## 2018-01-23 DIAGNOSIS — N529 Male erectile dysfunction, unspecified: Secondary | ICD-10-CM | POA: Diagnosis not present

## 2018-01-23 DIAGNOSIS — Z6836 Body mass index (BMI) 36.0-36.9, adult: Secondary | ICD-10-CM | POA: Diagnosis not present

## 2018-01-23 DIAGNOSIS — Z1389 Encounter for screening for other disorder: Secondary | ICD-10-CM | POA: Diagnosis not present

## 2018-01-23 DIAGNOSIS — D126 Benign neoplasm of colon, unspecified: Secondary | ICD-10-CM | POA: Diagnosis not present

## 2018-01-23 DIAGNOSIS — H409 Unspecified glaucoma: Secondary | ICD-10-CM | POA: Diagnosis not present

## 2018-01-23 DIAGNOSIS — Z0000 Encounter for general adult medical examination without abnormal findings: Secondary | ICD-10-CM | POA: Diagnosis not present

## 2018-01-23 DIAGNOSIS — E7800 Pure hypercholesterolemia, unspecified: Secondary | ICD-10-CM | POA: Diagnosis not present

## 2018-01-23 DIAGNOSIS — L309 Dermatitis, unspecified: Secondary | ICD-10-CM | POA: Diagnosis not present

## 2018-01-23 DIAGNOSIS — Z125 Encounter for screening for malignant neoplasm of prostate: Secondary | ICD-10-CM | POA: Diagnosis not present

## 2018-01-23 DIAGNOSIS — Z79899 Other long term (current) drug therapy: Secondary | ICD-10-CM | POA: Diagnosis not present

## 2018-01-31 DIAGNOSIS — G4733 Obstructive sleep apnea (adult) (pediatric): Secondary | ICD-10-CM | POA: Diagnosis not present

## 2018-02-07 DIAGNOSIS — M9903 Segmental and somatic dysfunction of lumbar region: Secondary | ICD-10-CM | POA: Diagnosis not present

## 2018-02-07 DIAGNOSIS — M545 Low back pain: Secondary | ICD-10-CM | POA: Diagnosis not present

## 2018-02-10 DIAGNOSIS — H40013 Open angle with borderline findings, low risk, bilateral: Secondary | ICD-10-CM | POA: Diagnosis not present

## 2018-03-02 DIAGNOSIS — G4733 Obstructive sleep apnea (adult) (pediatric): Secondary | ICD-10-CM | POA: Diagnosis not present

## 2018-03-17 DIAGNOSIS — M545 Low back pain: Secondary | ICD-10-CM | POA: Diagnosis not present

## 2018-03-17 DIAGNOSIS — M9903 Segmental and somatic dysfunction of lumbar region: Secondary | ICD-10-CM | POA: Diagnosis not present

## 2018-04-02 DIAGNOSIS — G4733 Obstructive sleep apnea (adult) (pediatric): Secondary | ICD-10-CM | POA: Diagnosis not present

## 2018-04-10 DIAGNOSIS — M545 Low back pain: Secondary | ICD-10-CM | POA: Diagnosis not present

## 2018-04-10 DIAGNOSIS — M9903 Segmental and somatic dysfunction of lumbar region: Secondary | ICD-10-CM | POA: Diagnosis not present

## 2018-04-24 DIAGNOSIS — M545 Low back pain: Secondary | ICD-10-CM | POA: Diagnosis not present

## 2018-04-24 DIAGNOSIS — M9903 Segmental and somatic dysfunction of lumbar region: Secondary | ICD-10-CM | POA: Diagnosis not present

## 2018-04-25 DIAGNOSIS — L281 Prurigo nodularis: Secondary | ICD-10-CM | POA: Diagnosis not present

## 2018-04-25 DIAGNOSIS — L821 Other seborrheic keratosis: Secondary | ICD-10-CM | POA: Diagnosis not present

## 2018-04-25 DIAGNOSIS — D1801 Hemangioma of skin and subcutaneous tissue: Secondary | ICD-10-CM | POA: Diagnosis not present

## 2018-04-25 DIAGNOSIS — M9903 Segmental and somatic dysfunction of lumbar region: Secondary | ICD-10-CM | POA: Diagnosis not present

## 2018-04-25 DIAGNOSIS — M545 Low back pain: Secondary | ICD-10-CM | POA: Diagnosis not present

## 2018-04-25 DIAGNOSIS — Z85828 Personal history of other malignant neoplasm of skin: Secondary | ICD-10-CM | POA: Diagnosis not present

## 2018-04-25 DIAGNOSIS — L814 Other melanin hyperpigmentation: Secondary | ICD-10-CM | POA: Diagnosis not present

## 2018-05-02 DIAGNOSIS — G4733 Obstructive sleep apnea (adult) (pediatric): Secondary | ICD-10-CM | POA: Diagnosis not present

## 2018-06-02 DIAGNOSIS — G4733 Obstructive sleep apnea (adult) (pediatric): Secondary | ICD-10-CM | POA: Diagnosis not present

## 2018-06-06 DIAGNOSIS — M545 Low back pain: Secondary | ICD-10-CM | POA: Diagnosis not present

## 2018-06-06 DIAGNOSIS — M9903 Segmental and somatic dysfunction of lumbar region: Secondary | ICD-10-CM | POA: Diagnosis not present

## 2018-07-03 DIAGNOSIS — G4733 Obstructive sleep apnea (adult) (pediatric): Secondary | ICD-10-CM | POA: Diagnosis not present

## 2018-08-02 DIAGNOSIS — G4733 Obstructive sleep apnea (adult) (pediatric): Secondary | ICD-10-CM | POA: Diagnosis not present

## 2018-08-11 DIAGNOSIS — M9903 Segmental and somatic dysfunction of lumbar region: Secondary | ICD-10-CM | POA: Diagnosis not present

## 2018-08-11 DIAGNOSIS — M545 Low back pain: Secondary | ICD-10-CM | POA: Diagnosis not present

## 2018-08-22 DIAGNOSIS — G4733 Obstructive sleep apnea (adult) (pediatric): Secondary | ICD-10-CM | POA: Diagnosis not present

## 2018-08-25 DIAGNOSIS — H26493 Other secondary cataract, bilateral: Secondary | ICD-10-CM | POA: Diagnosis not present

## 2018-08-25 DIAGNOSIS — H52203 Unspecified astigmatism, bilateral: Secondary | ICD-10-CM | POA: Diagnosis not present

## 2018-08-25 DIAGNOSIS — H40013 Open angle with borderline findings, low risk, bilateral: Secondary | ICD-10-CM | POA: Diagnosis not present

## 2018-08-25 DIAGNOSIS — H43813 Vitreous degeneration, bilateral: Secondary | ICD-10-CM | POA: Diagnosis not present

## 2018-09-11 DIAGNOSIS — M545 Low back pain: Secondary | ICD-10-CM | POA: Diagnosis not present

## 2018-09-11 DIAGNOSIS — M9903 Segmental and somatic dysfunction of lumbar region: Secondary | ICD-10-CM | POA: Diagnosis not present

## 2018-10-27 DIAGNOSIS — M9903 Segmental and somatic dysfunction of lumbar region: Secondary | ICD-10-CM | POA: Diagnosis not present

## 2018-10-27 DIAGNOSIS — M545 Low back pain: Secondary | ICD-10-CM | POA: Diagnosis not present

## 2018-11-13 DIAGNOSIS — M9903 Segmental and somatic dysfunction of lumbar region: Secondary | ICD-10-CM | POA: Diagnosis not present

## 2018-11-13 DIAGNOSIS — M545 Low back pain: Secondary | ICD-10-CM | POA: Diagnosis not present

## 2018-12-07 DIAGNOSIS — R69 Illness, unspecified: Secondary | ICD-10-CM | POA: Diagnosis not present

## 2019-01-16 DIAGNOSIS — G4733 Obstructive sleep apnea (adult) (pediatric): Secondary | ICD-10-CM | POA: Diagnosis not present

## 2019-03-09 DIAGNOSIS — H43813 Vitreous degeneration, bilateral: Secondary | ICD-10-CM | POA: Diagnosis not present

## 2019-03-09 DIAGNOSIS — H40013 Open angle with borderline findings, low risk, bilateral: Secondary | ICD-10-CM | POA: Diagnosis not present

## 2019-03-09 DIAGNOSIS — H26493 Other secondary cataract, bilateral: Secondary | ICD-10-CM | POA: Diagnosis not present

## 2019-03-22 DIAGNOSIS — H26492 Other secondary cataract, left eye: Secondary | ICD-10-CM | POA: Diagnosis not present

## 2019-05-03 DIAGNOSIS — H26491 Other secondary cataract, right eye: Secondary | ICD-10-CM | POA: Diagnosis not present

## 2019-05-08 DIAGNOSIS — D126 Benign neoplasm of colon, unspecified: Secondary | ICD-10-CM | POA: Diagnosis not present

## 2019-05-08 DIAGNOSIS — Z125 Encounter for screening for malignant neoplasm of prostate: Secondary | ICD-10-CM | POA: Diagnosis not present

## 2019-05-08 DIAGNOSIS — H409 Unspecified glaucoma: Secondary | ICD-10-CM | POA: Diagnosis not present

## 2019-05-08 DIAGNOSIS — G4733 Obstructive sleep apnea (adult) (pediatric): Secondary | ICD-10-CM | POA: Diagnosis not present

## 2019-05-08 DIAGNOSIS — L309 Dermatitis, unspecified: Secondary | ICD-10-CM | POA: Diagnosis not present

## 2019-05-08 DIAGNOSIS — Z1389 Encounter for screening for other disorder: Secondary | ICD-10-CM | POA: Diagnosis not present

## 2019-05-08 DIAGNOSIS — N529 Male erectile dysfunction, unspecified: Secondary | ICD-10-CM | POA: Diagnosis not present

## 2019-05-08 DIAGNOSIS — Z79899 Other long term (current) drug therapy: Secondary | ICD-10-CM | POA: Diagnosis not present

## 2019-05-08 DIAGNOSIS — E7800 Pure hypercholesterolemia, unspecified: Secondary | ICD-10-CM | POA: Diagnosis not present

## 2019-05-08 DIAGNOSIS — Z0001 Encounter for general adult medical examination with abnormal findings: Secondary | ICD-10-CM | POA: Diagnosis not present

## 2019-05-18 DIAGNOSIS — M9903 Segmental and somatic dysfunction of lumbar region: Secondary | ICD-10-CM | POA: Diagnosis not present

## 2019-05-18 DIAGNOSIS — M545 Low back pain: Secondary | ICD-10-CM | POA: Diagnosis not present

## 2019-05-21 DIAGNOSIS — Z0100 Encounter for examination of eyes and vision without abnormal findings: Secondary | ICD-10-CM | POA: Diagnosis not present

## 2019-06-13 DIAGNOSIS — L570 Actinic keratosis: Secondary | ICD-10-CM | POA: Diagnosis not present

## 2019-06-13 DIAGNOSIS — Z85828 Personal history of other malignant neoplasm of skin: Secondary | ICD-10-CM | POA: Diagnosis not present

## 2019-06-13 DIAGNOSIS — L814 Other melanin hyperpigmentation: Secondary | ICD-10-CM | POA: Diagnosis not present

## 2019-06-26 DIAGNOSIS — R69 Illness, unspecified: Secondary | ICD-10-CM | POA: Diagnosis not present

## 2019-07-02 DIAGNOSIS — M545 Low back pain: Secondary | ICD-10-CM | POA: Diagnosis not present

## 2019-07-02 DIAGNOSIS — M9903 Segmental and somatic dysfunction of lumbar region: Secondary | ICD-10-CM | POA: Diagnosis not present

## 2019-07-25 DIAGNOSIS — R69 Illness, unspecified: Secondary | ICD-10-CM | POA: Diagnosis not present

## 2019-09-10 DIAGNOSIS — G4733 Obstructive sleep apnea (adult) (pediatric): Secondary | ICD-10-CM | POA: Diagnosis not present

## 2019-10-10 DIAGNOSIS — G4733 Obstructive sleep apnea (adult) (pediatric): Secondary | ICD-10-CM | POA: Diagnosis not present

## 2019-11-10 DIAGNOSIS — G4733 Obstructive sleep apnea (adult) (pediatric): Secondary | ICD-10-CM | POA: Diagnosis not present

## 2019-11-14 DIAGNOSIS — Z961 Presence of intraocular lens: Secondary | ICD-10-CM | POA: Diagnosis not present

## 2019-11-14 DIAGNOSIS — H43813 Vitreous degeneration, bilateral: Secondary | ICD-10-CM | POA: Diagnosis not present

## 2019-11-14 DIAGNOSIS — H40013 Open angle with borderline findings, low risk, bilateral: Secondary | ICD-10-CM | POA: Diagnosis not present

## 2020-01-23 DIAGNOSIS — G4733 Obstructive sleep apnea (adult) (pediatric): Secondary | ICD-10-CM | POA: Diagnosis not present

## 2020-02-12 DIAGNOSIS — H9210 Otorrhea, unspecified ear: Secondary | ICD-10-CM | POA: Diagnosis not present

## 2020-04-21 DIAGNOSIS — M9903 Segmental and somatic dysfunction of lumbar region: Secondary | ICD-10-CM | POA: Diagnosis not present

## 2020-04-21 DIAGNOSIS — M545 Low back pain: Secondary | ICD-10-CM | POA: Diagnosis not present

## 2020-05-19 DIAGNOSIS — E7800 Pure hypercholesterolemia, unspecified: Secondary | ICD-10-CM | POA: Diagnosis not present

## 2020-05-19 DIAGNOSIS — E559 Vitamin D deficiency, unspecified: Secondary | ICD-10-CM | POA: Diagnosis not present

## 2020-05-19 DIAGNOSIS — G4733 Obstructive sleep apnea (adult) (pediatric): Secondary | ICD-10-CM | POA: Diagnosis not present

## 2020-05-19 DIAGNOSIS — H409 Unspecified glaucoma: Secondary | ICD-10-CM | POA: Diagnosis not present

## 2020-05-19 DIAGNOSIS — L309 Dermatitis, unspecified: Secondary | ICD-10-CM | POA: Diagnosis not present

## 2020-05-19 DIAGNOSIS — Z79899 Other long term (current) drug therapy: Secondary | ICD-10-CM | POA: Diagnosis not present

## 2020-05-19 DIAGNOSIS — N529 Male erectile dysfunction, unspecified: Secondary | ICD-10-CM | POA: Diagnosis not present

## 2020-05-19 DIAGNOSIS — Z0001 Encounter for general adult medical examination with abnormal findings: Secondary | ICD-10-CM | POA: Diagnosis not present

## 2020-05-19 DIAGNOSIS — D126 Benign neoplasm of colon, unspecified: Secondary | ICD-10-CM | POA: Diagnosis not present

## 2020-06-17 DIAGNOSIS — H40013 Open angle with borderline findings, low risk, bilateral: Secondary | ICD-10-CM | POA: Diagnosis not present

## 2020-06-25 DIAGNOSIS — L814 Other melanin hyperpigmentation: Secondary | ICD-10-CM | POA: Diagnosis not present

## 2020-06-25 DIAGNOSIS — L570 Actinic keratosis: Secondary | ICD-10-CM | POA: Diagnosis not present

## 2020-06-25 DIAGNOSIS — L820 Inflamed seborrheic keratosis: Secondary | ICD-10-CM | POA: Diagnosis not present

## 2020-06-25 DIAGNOSIS — D1801 Hemangioma of skin and subcutaneous tissue: Secondary | ICD-10-CM | POA: Diagnosis not present

## 2020-06-25 DIAGNOSIS — L821 Other seborrheic keratosis: Secondary | ICD-10-CM | POA: Diagnosis not present

## 2020-06-25 DIAGNOSIS — Z85828 Personal history of other malignant neoplasm of skin: Secondary | ICD-10-CM | POA: Diagnosis not present

## 2020-07-17 DIAGNOSIS — R69 Illness, unspecified: Secondary | ICD-10-CM | POA: Diagnosis not present

## 2020-07-18 DIAGNOSIS — G4733 Obstructive sleep apnea (adult) (pediatric): Secondary | ICD-10-CM | POA: Diagnosis not present

## 2020-08-11 DIAGNOSIS — Z86010 Personal history of colonic polyps: Secondary | ICD-10-CM | POA: Diagnosis not present

## 2020-08-11 DIAGNOSIS — K5900 Constipation, unspecified: Secondary | ICD-10-CM | POA: Diagnosis not present

## 2020-08-11 DIAGNOSIS — H409 Unspecified glaucoma: Secondary | ICD-10-CM | POA: Diagnosis not present

## 2020-08-18 DIAGNOSIS — G4733 Obstructive sleep apnea (adult) (pediatric): Secondary | ICD-10-CM | POA: Diagnosis not present

## 2020-09-12 DIAGNOSIS — K5900 Constipation, unspecified: Secondary | ICD-10-CM | POA: Diagnosis not present

## 2020-09-12 DIAGNOSIS — N419 Inflammatory disease of prostate, unspecified: Secondary | ICD-10-CM | POA: Diagnosis not present

## 2020-09-12 DIAGNOSIS — R34 Anuria and oliguria: Secondary | ICD-10-CM | POA: Diagnosis not present

## 2020-09-16 ENCOUNTER — Other Ambulatory Visit: Payer: Self-pay

## 2020-09-16 ENCOUNTER — Inpatient Hospital Stay (HOSPITAL_BASED_OUTPATIENT_CLINIC_OR_DEPARTMENT_OTHER)
Admission: EM | Admit: 2020-09-16 | Discharge: 2020-10-11 | DRG: 207 | Disposition: E | Payer: Medicare HMO | Attending: Emergency Medicine | Admitting: Emergency Medicine

## 2020-09-16 ENCOUNTER — Emergency Department (HOSPITAL_BASED_OUTPATIENT_CLINIC_OR_DEPARTMENT_OTHER): Payer: Medicare HMO

## 2020-09-16 ENCOUNTER — Encounter (HOSPITAL_BASED_OUTPATIENT_CLINIC_OR_DEPARTMENT_OTHER): Payer: Self-pay | Admitting: Emergency Medicine

## 2020-09-16 DIAGNOSIS — D689 Coagulation defect, unspecified: Secondary | ICD-10-CM

## 2020-09-16 DIAGNOSIS — A4189 Other specified sepsis: Secondary | ICD-10-CM | POA: Diagnosis not present

## 2020-09-16 DIAGNOSIS — Z6833 Body mass index (BMI) 33.0-33.9, adult: Secondary | ICD-10-CM

## 2020-09-16 DIAGNOSIS — Z85828 Personal history of other malignant neoplasm of skin: Secondary | ICD-10-CM

## 2020-09-16 DIAGNOSIS — Z66 Do not resuscitate: Secondary | ICD-10-CM | POA: Diagnosis not present

## 2020-09-16 DIAGNOSIS — R58 Hemorrhage, not elsewhere classified: Secondary | ICD-10-CM | POA: Diagnosis not present

## 2020-09-16 DIAGNOSIS — D696 Thrombocytopenia, unspecified: Secondary | ICD-10-CM | POA: Diagnosis present

## 2020-09-16 DIAGNOSIS — L89626 Pressure-induced deep tissue damage of left heel: Secondary | ICD-10-CM | POA: Diagnosis not present

## 2020-09-16 DIAGNOSIS — G4733 Obstructive sleep apnea (adult) (pediatric): Secondary | ICD-10-CM | POA: Diagnosis present

## 2020-09-16 DIAGNOSIS — H409 Unspecified glaucoma: Secondary | ICD-10-CM | POA: Diagnosis present

## 2020-09-16 DIAGNOSIS — U071 COVID-19: Principal | ICD-10-CM | POA: Diagnosis present

## 2020-09-16 DIAGNOSIS — L89812 Pressure ulcer of head, stage 2: Secondary | ICD-10-CM | POA: Diagnosis not present

## 2020-09-16 DIAGNOSIS — R0602 Shortness of breath: Secondary | ICD-10-CM

## 2020-09-16 DIAGNOSIS — L8990 Pressure ulcer of unspecified site, unspecified stage: Secondary | ICD-10-CM | POA: Diagnosis present

## 2020-09-16 DIAGNOSIS — T887XXA Unspecified adverse effect of drug or medicament, initial encounter: Secondary | ICD-10-CM | POA: Diagnosis not present

## 2020-09-16 DIAGNOSIS — J159 Unspecified bacterial pneumonia: Secondary | ICD-10-CM | POA: Diagnosis present

## 2020-09-16 DIAGNOSIS — R55 Syncope and collapse: Secondary | ICD-10-CM | POA: Diagnosis not present

## 2020-09-16 DIAGNOSIS — A419 Sepsis, unspecified organism: Secondary | ICD-10-CM | POA: Diagnosis present

## 2020-09-16 DIAGNOSIS — R0902 Hypoxemia: Secondary | ICD-10-CM

## 2020-09-16 DIAGNOSIS — L89616 Pressure-induced deep tissue damage of right heel: Secondary | ICD-10-CM | POA: Diagnosis not present

## 2020-09-16 DIAGNOSIS — J1282 Pneumonia due to coronavirus disease 2019: Secondary | ICD-10-CM | POA: Diagnosis present

## 2020-09-16 DIAGNOSIS — N410 Acute prostatitis: Secondary | ICD-10-CM | POA: Diagnosis not present

## 2020-09-16 DIAGNOSIS — I491 Atrial premature depolarization: Secondary | ICD-10-CM | POA: Diagnosis not present

## 2020-09-16 DIAGNOSIS — Z79899 Other long term (current) drug therapy: Secondary | ICD-10-CM

## 2020-09-16 DIAGNOSIS — Z0184 Encounter for antibody response examination: Secondary | ICD-10-CM

## 2020-09-16 DIAGNOSIS — Z9852 Vasectomy status: Secondary | ICD-10-CM

## 2020-09-16 DIAGNOSIS — J80 Acute respiratory distress syndrome: Secondary | ICD-10-CM | POA: Diagnosis present

## 2020-09-16 DIAGNOSIS — T82838A Hemorrhage of vascular prosthetic devices, implants and grafts, initial encounter: Secondary | ICD-10-CM | POA: Diagnosis not present

## 2020-09-16 DIAGNOSIS — Z7982 Long term (current) use of aspirin: Secondary | ICD-10-CM

## 2020-09-16 DIAGNOSIS — J189 Pneumonia, unspecified organism: Secondary | ICD-10-CM | POA: Diagnosis not present

## 2020-09-16 DIAGNOSIS — Z515 Encounter for palliative care: Secondary | ICD-10-CM

## 2020-09-16 DIAGNOSIS — N419 Inflammatory disease of prostate, unspecified: Secondary | ICD-10-CM | POA: Diagnosis present

## 2020-09-16 DIAGNOSIS — N170 Acute kidney failure with tubular necrosis: Secondary | ICD-10-CM | POA: Diagnosis not present

## 2020-09-16 DIAGNOSIS — I82451 Acute embolism and thrombosis of right peroneal vein: Secondary | ICD-10-CM | POA: Diagnosis not present

## 2020-09-16 DIAGNOSIS — J9621 Acute and chronic respiratory failure with hypoxia: Secondary | ICD-10-CM

## 2020-09-16 DIAGNOSIS — K7689 Other specified diseases of liver: Secondary | ICD-10-CM | POA: Diagnosis present

## 2020-09-16 DIAGNOSIS — I82441 Acute embolism and thrombosis of right tibial vein: Secondary | ICD-10-CM | POA: Diagnosis not present

## 2020-09-16 DIAGNOSIS — R6521 Severe sepsis with septic shock: Secondary | ICD-10-CM | POA: Diagnosis not present

## 2020-09-16 DIAGNOSIS — E870 Hyperosmolality and hypernatremia: Secondary | ICD-10-CM | POA: Diagnosis not present

## 2020-09-16 DIAGNOSIS — R509 Fever, unspecified: Secondary | ICD-10-CM | POA: Diagnosis not present

## 2020-09-16 DIAGNOSIS — E875 Hyperkalemia: Secondary | ICD-10-CM | POA: Diagnosis not present

## 2020-09-16 DIAGNOSIS — J9601 Acute respiratory failure with hypoxia: Secondary | ICD-10-CM | POA: Diagnosis present

## 2020-09-16 DIAGNOSIS — G9340 Encephalopathy, unspecified: Secondary | ICD-10-CM | POA: Diagnosis not present

## 2020-09-16 DIAGNOSIS — D65 Disseminated intravascular coagulation [defibrination syndrome]: Secondary | ICD-10-CM | POA: Diagnosis not present

## 2020-09-16 DIAGNOSIS — Z978 Presence of other specified devices: Secondary | ICD-10-CM

## 2020-09-16 DIAGNOSIS — Y838 Other surgical procedures as the cause of abnormal reaction of the patient, or of later complication, without mention of misadventure at the time of the procedure: Secondary | ICD-10-CM | POA: Diagnosis not present

## 2020-09-16 DIAGNOSIS — J9600 Acute respiratory failure, unspecified whether with hypoxia or hypercapnia: Secondary | ICD-10-CM | POA: Diagnosis present

## 2020-09-16 DIAGNOSIS — E669 Obesity, unspecified: Secondary | ICD-10-CM | POA: Diagnosis present

## 2020-09-16 DIAGNOSIS — N179 Acute kidney failure, unspecified: Secondary | ICD-10-CM | POA: Diagnosis not present

## 2020-09-16 DIAGNOSIS — R0603 Acute respiratory distress: Secondary | ICD-10-CM

## 2020-09-16 HISTORY — DX: Unspecified malignant neoplasm of skin, unspecified: C44.90

## 2020-09-16 HISTORY — DX: Obstructive sleep apnea (adult) (pediatric): G47.33

## 2020-09-16 HISTORY — DX: Unspecified glaucoma: H40.9

## 2020-09-16 LAB — LACTIC ACID, PLASMA
Lactic Acid, Venous: 1.3 mmol/L (ref 0.5–1.9)
Lactic Acid, Venous: 1.5 mmol/L (ref 0.5–1.9)

## 2020-09-16 LAB — COMPREHENSIVE METABOLIC PANEL
ALT: 34 U/L (ref 0–44)
AST: 49 U/L — ABNORMAL HIGH (ref 15–41)
Albumin: 3.7 g/dL (ref 3.5–5.0)
Alkaline Phosphatase: 51 U/L (ref 38–126)
Anion gap: 11 (ref 5–15)
BUN: 21 mg/dL (ref 8–23)
CO2: 22 mmol/L (ref 22–32)
Calcium: 8.3 mg/dL — ABNORMAL LOW (ref 8.9–10.3)
Chloride: 99 mmol/L (ref 98–111)
Creatinine, Ser: 0.98 mg/dL (ref 0.61–1.24)
GFR, Estimated: >60 mL/min (ref >60)
Glucose, Bld: 130 mg/dL — ABNORMAL HIGH (ref 70–99)
Potassium: 3.4 mmol/L — ABNORMAL LOW (ref 3.5–5.1)
Sodium: 132 mmol/L — ABNORMAL LOW (ref 135–145)
Total Bilirubin: 0.8 mg/dL (ref 0.3–1.2)
Total Protein: 7.6 g/dL (ref 6.5–8.1)

## 2020-09-16 LAB — CBC WITH DIFFERENTIAL/PLATELET
Abs Immature Granulocytes: 0.01 10*3/uL (ref 0.00–0.07)
Basophils Absolute: 0 10*3/uL (ref 0.0–0.1)
Basophils Relative: 0 %
Eosinophils Absolute: 0 10*3/uL (ref 0.0–0.5)
Eosinophils Relative: 0 %
HCT: 40.4 % (ref 39.0–52.0)
Hemoglobin: 13.9 g/dL (ref 13.0–17.0)
Immature Granulocytes: 0 %
Lymphocytes Relative: 7 %
Lymphs Abs: 0.4 10*3/uL — ABNORMAL LOW (ref 0.7–4.0)
MCH: 32.6 pg (ref 26.0–34.0)
MCHC: 34.4 g/dL (ref 30.0–36.0)
MCV: 94.8 fL (ref 80.0–100.0)
Monocytes Absolute: 0.2 10*3/uL (ref 0.1–1.0)
Monocytes Relative: 3 %
Neutro Abs: 5 10*3/uL (ref 1.7–7.7)
Neutrophils Relative %: 90 %
Platelets: 122 10*3/uL — ABNORMAL LOW (ref 150–400)
RBC: 4.26 MIL/uL (ref 4.22–5.81)
RDW: 13.3 % (ref 11.5–15.5)
WBC: 5.5 10*3/uL (ref 4.0–10.5)
nRBC: 0 % (ref 0.0–0.2)

## 2020-09-16 LAB — D-DIMER, QUANTITATIVE: D-Dimer, Quant: 1.06 ug/mL-FEU — ABNORMAL HIGH (ref 0.00–0.50)

## 2020-09-16 LAB — PROTIME-INR
INR: 1.1 (ref 0.8–1.2)
Prothrombin Time: 14 seconds (ref 11.4–15.2)

## 2020-09-16 LAB — RESP PANEL BY RT-PCR (FLU A&B, COVID) ARPGX2
Influenza A by PCR: NEGATIVE
Influenza B by PCR: NEGATIVE
SARS Coronavirus 2 by RT PCR: POSITIVE — AB

## 2020-09-16 LAB — TROPONIN I (HIGH SENSITIVITY)
Troponin I (High Sensitivity): 13 ng/L (ref <18)
Troponin I (High Sensitivity): 13 ng/L (ref <18)

## 2020-09-16 MED ORDER — DEXAMETHASONE SODIUM PHOSPHATE 10 MG/ML IJ SOLN
6.0000 mg | Freq: Once | INTRAMUSCULAR | Status: AC
  Administered 2020-09-16: 6 mg via INTRAVENOUS
  Filled 2020-09-16: qty 1

## 2020-09-16 MED ORDER — GUAIFENESIN-DM 100-10 MG/5ML PO SYRP
10.0000 mL | ORAL_SOLUTION | ORAL | Status: DC | PRN
  Administered 2020-09-24 – 2020-09-28 (×3): 10 mL via ORAL
  Filled 2020-09-16 (×4): qty 10

## 2020-09-16 MED ORDER — ACETAMINOPHEN 325 MG PO TABS
650.0000 mg | ORAL_TABLET | ORAL | Status: DC | PRN
  Administered 2020-09-17 – 2020-09-25 (×2): 650 mg via ORAL
  Filled 2020-09-16 (×3): qty 2

## 2020-09-16 MED ORDER — SODIUM CHLORIDE 0.9 % IV BOLUS
500.0000 mL | Freq: Once | INTRAVENOUS | Status: AC
  Administered 2020-09-16: 500 mL via INTRAVENOUS

## 2020-09-16 MED ORDER — ZINC SULFATE 220 (50 ZN) MG PO CAPS
220.0000 mg | ORAL_CAPSULE | Freq: Every day | ORAL | Status: DC
  Administered 2020-09-18 – 2020-09-29 (×12): 220 mg via ORAL
  Filled 2020-09-16 (×13): qty 1

## 2020-09-16 MED ORDER — SODIUM CHLORIDE 0.9 % IV SOLN
100.0000 mg | Freq: Every day | INTRAVENOUS | Status: DC
  Administered 2020-09-17: 100 mg via INTRAVENOUS
  Filled 2020-09-16 (×2): qty 20

## 2020-09-16 MED ORDER — SODIUM CHLORIDE 0.9 % IV SOLN
200.0000 mg | Freq: Once | INTRAVENOUS | Status: DC
  Filled 2020-09-16: qty 40

## 2020-09-16 MED ORDER — DEXAMETHASONE SODIUM PHOSPHATE 10 MG/ML IJ SOLN
6.0000 mg | INTRAMUSCULAR | Status: DC
  Administered 2020-09-17: 6 mg via INTRAVENOUS
  Filled 2020-09-16: qty 1

## 2020-09-16 MED ORDER — ALBUTEROL SULFATE HFA 108 (90 BASE) MCG/ACT IN AERS
2.0000 | INHALATION_SPRAY | Freq: Four times a day (QID) | RESPIRATORY_TRACT | Status: DC
  Administered 2020-09-17 (×3): 2 via RESPIRATORY_TRACT
  Filled 2020-09-16: qty 6.7

## 2020-09-16 MED ORDER — ASCORBIC ACID 500 MG PO TABS
500.0000 mg | ORAL_TABLET | Freq: Every day | ORAL | Status: DC
  Administered 2020-09-18 – 2020-09-29 (×12): 500 mg via ORAL
  Filled 2020-09-16 (×13): qty 1

## 2020-09-16 MED ORDER — LACTATED RINGERS IV SOLN: INTRAVENOUS | Status: AC

## 2020-09-16 MED ORDER — ONDANSETRON HCL 4 MG/2ML IJ SOLN
4.0000 mg | Freq: Four times a day (QID) | INTRAMUSCULAR | Status: DC | PRN
  Administered 2020-09-17: 4 mg via INTRAVENOUS
  Filled 2020-09-16: qty 2

## 2020-09-16 NOTE — ED Notes (Signed)
PT ambulated with somewhat steady gait with standby assist. PT minimally exerted self next to bed and dropped to 90% pt remained 94% otherwise.

## 2020-09-16 NOTE — ED Notes (Signed)
Wife informs me pt had a syncopal episode last night and was not responsive for approximately 2 min. Fire dept came and helped pt up.

## 2020-09-16 NOTE — ED Triage Notes (Signed)
Pt fever at home 102.6 and had tylenol 1830 at home.

## 2020-09-16 NOTE — ED Provider Notes (Signed)
[facility] EMERGENCY DEPARTMENT Provider Note   CSN: [id number] Arrival date & time: 09/27/2020  2016     History Chief Complaint  Patient presents with  . Fever    Javier Peterson is a 74 y.o. male.  The history is provided by the patient and medical records.  Fever  Javier Peterson is a 74 y.o. male who presents to the Emergency Department complaining of fever, malaise.   Scheduled for colonoscopy on Thursday - sent home b/c prep was not sufficient.   Feeling unwell since Thursday. Difficulty urinating.  Dx with prostatitis on Friday and started cipro Friday night, BID. He had bilateral flank pain with urinating and unable to get urine out - now resolved.  Since then he has been sleeping excessively, poor appetite.  Confused at times.  Tonight fever to 102.6 tonight.  Has a mild cough for 2-3 days. Had nausea/vomiting -earlier today, now gone.     Has no known medical problems, takes no medications.  He passed out this morning at 4am.  He was getting up in the night to take ibuprofen for kidney pain when he passed out and fell to the ground.  He was unresponsive and on the floor for a few minutes last night.  He had some chest pain this morning related to the syncopal event - now gone.    Denies leg swelling/pain, abdominal pain.  Has not been vaccinated for covid 19.      History reviewed. No pertinent past medical history.  Patient Active Problem List   Diagnosis Date Noted  . COVID-19 virus infection 09/11/2020    History reviewed. No pertinent surgical history.     No family history on file.  Social History   Tobacco Use  . Smoking status: Never Smoker  Substance Use Topics  . Alcohol use: No  . Drug use: No    Home Medications Prior to Admission medications   Medication Sig Start Date End Date Taking? Authorizing Provider  ciprofloxacin (CIPRO) 500 MG tablet Take 500 mg by mouth 2 (two) times daily.   Yes [provider]  aspirin  EC 81 MG tablet Take 81 mg by mouth daily.    [provider]  FIBER PO Take 2 capsules by mouth daily.    [provider]  Glucosamine HCl (GLUCOSAMINE PO) Take 2 capsules by mouth daily.    [provider]  Omega-3 Fatty Acids (FISH OIL PO) Take 1 capsule by mouth daily.    [provider]    Allergies    Patient has no known allergies.  Review of Systems   Review of Systems  Constitutional: Positive for fever.  All other systems reviewed and are negative.   Physical Exam Updated Vital Signs BP 134/70 (BP Location: Right Arm)   Pulse (!) 56   Temp 98.6 F (37 C)   Resp 20   Ht 6\' 2"  (1.88 m)   Wt 122.5 kg   SpO2 95%   BMI 34.67 kg/m   Physical Exam Vitals and nursing note reviewed.  Constitutional:      Appearance: He is well-developed.  HENT:     Head: Normocephalic and atraumatic.  Cardiovascular:     Rate and Rhythm: Normal rate and regular rhythm.     Heart sounds: No murmur heard.   Pulmonary:     Effort: Pulmonary effort is normal. No respiratory distress.     Comments: Fine crackles bilaterally Abdominal:     Palpations: Abdomen  is soft.     Tenderness: There is no abdominal tenderness. There is no guarding or rebound.  Musculoskeletal:        General: No swelling or tenderness.  Skin:    General: Skin is warm and dry.  Neurological:     Mental Status: He is alert and oriented to person, place, and time.     Comments: Generalized weakness  Psychiatric:        Behavior: Behavior normal.     ED Results / Procedures / Treatments   Labs (all labs ordered are listed, but only abnormal results are displayed) Labs Reviewed  RESP PANEL BY RT-PCR (FLU A&B, COVID) ARPGX2 - Abnormal; Notable for the following components:      Result Value   SARS Coronavirus 2 by RT PCR POSITIVE (*)    All other components within normal limits  COMPREHENSIVE METABOLIC PANEL - Abnormal; Notable for the following components:   Sodium 132  (*)    Potassium 3.4 (*)    Glucose, Bld 130 (*)    Calcium 8.3 (*)    AST 49 (*)    All other components within normal limits  CBC WITH DIFFERENTIAL/PLATELET - Abnormal; Notable for the following components:   Platelets 122 (*)    Lymphs Abs 0.4 (*)    All other components within normal limits  CULTURE, BLOOD (ROUTINE X 2)  CULTURE, BLOOD (ROUTINE X 2)  LACTIC ACID, PLASMA  PROTIME-INR  LACTIC ACID, PLASMA  URINALYSIS, ROUTINE W REFLEX MICROSCOPIC  D-DIMER, QUANTITATIVE (NOT AT [facility])  PROCALCITONIN  VITAMIN B12  TSH  CBC WITH DIFFERENTIAL/PLATELET  COMPREHENSIVE METABOLIC PANEL  C-REACTIVE PROTEIN  D-DIMER, QUANTITATIVE (NOT AT [facility])  FERRITIN  MAGNESIUM  PHOSPHORUS  TROPONIN I (HIGH SENSITIVITY)  TROPONIN I (HIGH SENSITIVITY)    EKG None  Radiology DG Chest Portable 1 View  Result Date: 09/15/2020 CLINICAL DATA:  Fever EXAM: PORTABLE CHEST 1 VIEW COMPARISON:  None. FINDINGS: There are hazy bilateral airspace opacities. The heart size is mildly enlarged. There is no pneumothorax or large pleural effusion. No acute osseous abnormality. IMPRESSION: Hazy bilateral airspace opacities concerning for multifocal pneumonia (viral or bacterial). Electronically Signed   By: Constance Holster M.D.   On: 10/10/2020 21:13    Procedures Procedures (including critical care time)  Medications Ordered in ED Medications  remdesivir 200 mg in sodium chloride 0.9% 250 mL IVPB (has no administration in time range)    Followed by  remdesivir 100 mg in sodium chloride 0.9 % 100 mL IVPB (has no administration in time range)  dexamethasone (DECADRON) injection 6 mg (has no administration in time range)  guaiFENesin-dextromethorphan (ROBITUSSIN DM) 100-10 MG/5ML syrup 10 mL (has no administration in time range)  ascorbic acid (VITAMIN C) tablet 500 mg (has no administration in time range)  zinc sulfate capsule 220 mg (has no administration in time range)  albuterol (VENTOLIN HFA) 108 (90  Base) MCG/ACT inhaler 2 puff (has no administration in time range)  lactated ringers infusion (has no administration in time range)  acetaminophen (TYLENOL) tablet 650 mg (has no administration in time range)  ondansetron (ZOFRAN) injection 4 mg (has no administration in time range)  sodium chloride 0.9 % bolus 500 mL (0 mLs Intravenous Stopped 09/21/2020 2303)  dexamethasone (DECADRON) injection 6 mg (6 mg Intravenous Given 09/25/2020 2320)    ED Course  I have reviewed the triage vital signs and the nursing notes.  Pertinent labs & imaging results that were available during my care of  the patient were reviewed by me and considered in my medical decision making (see chart for details).    MDM Rules/Calculators/A&P                          Patient here for evaluation of fever, confusion with syncopal events earlier today. He does have a new oxygen requirement on ED presentation, decides to mid 80s on minimal ambulation in the room. Chest x-ray with multifocal pneumonia. His COVID-19 test is positive today. Discussed with patient findings of studies. No evidence of overwhelming serious bacterial infection at this time. Given his COVID infection and hypoxia discussed with patient recommendation for admission and he is in agreement treatment plan. Hospitalist, Dr. Cyd Silence consulted for admission. Patient will be admitted to [facility] bed.  Final Clinical Impression(s) / ED Diagnoses Final diagnoses:  Pneumonia due to COVID-19 virus  Syncope, unspecified syncope type    Rx / DC Orders ED Discharge Orders    None       Quintella Reichert, MD 09/14/2020 2345

## 2020-09-16 NOTE — ED Triage Notes (Signed)
[facility] last [facility] due to inability to urinate. Pt started on Cipro for prostatitis. Pt has been increased sleeping and not eating. Pt started with fever today.

## 2020-09-16 NOTE — ED Notes (Signed)
Ambulated in room on room air, pt's oxygen down to 87% shortly after starting walking. EDP made aware, plan for admission.

## 2020-09-17 DIAGNOSIS — G4733 Obstructive sleep apnea (adult) (pediatric): Secondary | ICD-10-CM | POA: Diagnosis not present

## 2020-09-17 LAB — CBC WITH DIFFERENTIAL/PLATELET
Abs Immature Granulocytes: 0.02 10*3/uL (ref 0.00–0.07)
Basophils Absolute: 0 10*3/uL (ref 0.0–0.1)
Basophils Relative: 0 %
Eosinophils Absolute: 0 10*3/uL (ref 0.0–0.5)
Eosinophils Relative: 0 %
HCT: 39.9 % (ref 39.0–52.0)
Hemoglobin: 13.7 g/dL (ref 13.0–17.0)
Immature Granulocytes: 0 %
Lymphocytes Relative: 8 %
Lymphs Abs: 0.4 10*3/uL — ABNORMAL LOW (ref 0.7–4.0)
MCH: 32.8 pg (ref 26.0–34.0)
MCHC: 34.3 g/dL (ref 30.0–36.0)
MCV: 95.5 fL (ref 80.0–100.0)
Monocytes Absolute: 0.1 10*3/uL (ref 0.1–1.0)
Monocytes Relative: 3 %
Neutro Abs: 4.9 10*3/uL (ref 1.7–7.7)
Neutrophils Relative %: 89 %
Platelets: 115 10*3/uL — ABNORMAL LOW (ref 150–400)
RBC: 4.18 MIL/uL — ABNORMAL LOW (ref 4.22–5.81)
RDW: 13.2 % (ref 11.5–15.5)
WBC Morphology: ABNORMAL
WBC: 5.5 10*3/uL (ref 4.0–10.5)
nRBC: 0 % (ref 0.0–0.2)

## 2020-09-17 LAB — URINALYSIS, MICROSCOPIC (REFLEX): Squamous Epithelial / HPF: NONE SEEN (ref 0–5)

## 2020-09-17 LAB — COMPREHENSIVE METABOLIC PANEL
ALT: 32 U/L (ref 0–44)
AST: 50 U/L — ABNORMAL HIGH (ref 15–41)
Albumin: 3.3 g/dL — ABNORMAL LOW (ref 3.5–5.0)
Alkaline Phosphatase: 50 U/L (ref 38–126)
Anion gap: 9 (ref 5–15)
BUN: 19 mg/dL (ref 8–23)
CO2: 26 mmol/L (ref 22–32)
Calcium: 8.1 mg/dL — ABNORMAL LOW (ref 8.9–10.3)
Chloride: 98 mmol/L (ref 98–111)
Creatinine, Ser: 0.9 mg/dL (ref 0.61–1.24)
GFR, Estimated: >60 mL/min (ref >60)
Glucose, Bld: 135 mg/dL — ABNORMAL HIGH (ref 70–99)
Potassium: 3.8 mmol/L (ref 3.5–5.1)
Sodium: 133 mmol/L — ABNORMAL LOW (ref 135–145)
Total Bilirubin: 0.6 mg/dL (ref 0.3–1.2)
Total Protein: 6.9 g/dL (ref 6.5–8.1)

## 2020-09-17 LAB — PHOSPHORUS: Phosphorus: 2.4 mg/dL — ABNORMAL LOW (ref 2.5–4.6)

## 2020-09-17 LAB — C-REACTIVE PROTEIN: CRP: 5.8 mg/dL — ABNORMAL HIGH (ref <1.0)

## 2020-09-17 LAB — URINALYSIS, ROUTINE W REFLEX MICROSCOPIC
Bilirubin Urine: NEGATIVE
Glucose, UA: NEGATIVE mg/dL
Ketones, ur: NEGATIVE mg/dL
Leukocytes,Ua: NEGATIVE
Nitrite: NEGATIVE
Protein, ur: 100 mg/dL — AB
Specific Gravity, Urine: >1.03 — ABNORMAL HIGH (ref 1.005–1.030)
pH: 5.5 (ref 5.0–8.0)

## 2020-09-17 LAB — FERRITIN: Ferritin: 95 ng/mL (ref 24–336)

## 2020-09-17 LAB — D-DIMER, QUANTITATIVE: D-Dimer, Quant: 0.71 ug/mL-FEU — ABNORMAL HIGH (ref 0.00–0.50)

## 2020-09-17 LAB — PROCALCITONIN: Procalcitonin: <0.1 ng/mL

## 2020-09-17 LAB — MAGNESIUM: Magnesium: 2 mg/dL (ref 1.7–2.4)

## 2020-09-17 LAB — TSH: TSH: 0.791 u[IU]/mL (ref 0.350–4.500)

## 2020-09-17 LAB — VITAMIN B12: Vitamin B-12: 399 pg/mL (ref 180–914)

## 2020-09-17 MED ORDER — SODIUM CHLORIDE 0.9 % IV SOLN
100.0000 mg | Freq: Every day | INTRAVENOUS | Status: AC
  Administered 2020-09-18 – 2020-09-20 (×3): 100 mg via INTRAVENOUS
  Filled 2020-09-17 (×4): qty 20

## 2020-09-17 MED ORDER — SODIUM CHLORIDE 0.9 % IV SOLN
100.0000 mg | INTRAVENOUS | Status: AC
  Administered 2020-09-17: 100 mg via INTRAVENOUS

## 2020-09-17 MED ORDER — SODIUM CHLORIDE 0.9 % IV SOLN: 100.0000 mg | Freq: Every day | INTRAVENOUS | Status: DC

## 2020-09-17 MED ORDER — ALBUTEROL SULFATE HFA 108 (90 BASE) MCG/ACT IN AERS
2.0000 | INHALATION_SPRAY | RESPIRATORY_TRACT | Status: DC | PRN
  Administered 2020-09-21: 2 via RESPIRATORY_TRACT
  Filled 2020-09-17: qty 6.7

## 2020-09-17 MED ORDER — ACETAMINOPHEN 325 MG PO TABS
650.0000 mg | ORAL_TABLET | Freq: Once | ORAL | Status: AC
  Administered 2020-09-17: 650 mg via ORAL

## 2020-09-17 MED ORDER — ALBUTEROL SULFATE HFA 108 (90 BASE) MCG/ACT IN AERS
2.0000 | INHALATION_SPRAY | Freq: Three times a day (TID) | RESPIRATORY_TRACT | Status: DC
  Administered 2020-09-17 – 2020-09-25 (×25): 2 via RESPIRATORY_TRACT
  Filled 2020-09-17 (×2): qty 6.7

## 2020-09-17 NOTE — ED Notes (Signed)
Pt is concerned about taking remdesivir and asking about antibodies. MD aware and is going to go talk to pt.

## 2020-09-17 NOTE — ED Notes (Signed)
Vit c and zinc is not stocked at [facility]  Per [place] these 2 meds can be given at 2200

## 2020-09-17 NOTE — ED Notes (Signed)
Pt proned at this time 

## 2020-09-17 NOTE — ED Notes (Signed)
Patient offered an Ensure chocolate drink. Patient was able to consume all of drink without any difficulty. Patient tolerated well.

## 2020-09-17 NOTE — ED Notes (Signed)
Placed patient on [place] due to increasing decrease in oxygen saturation. Patient was trending 86-87% with increase in RR. Patient oxygen saturations are 90-93% on [place]. Will continue to monitor and make changes as necessary. Patient tolerating well.

## 2020-09-17 NOTE — ED Notes (Signed)
Increased nasal cannula to 4L per decrease in oxygen saturation. Oxygen saturation increased to 92%. Will continue to monitor and make changes as necessary. Patient tolerated well.

## 2020-09-17 NOTE — ED Notes (Signed)
Patient using IS and flutter on his own. Stated he is getting 1743ml each time. RT to monitor

## 2020-09-18 ENCOUNTER — Encounter (HOSPITAL_COMMUNITY): Payer: Self-pay | Admitting: Internal Medicine

## 2020-09-18 DIAGNOSIS — L89812 Pressure ulcer of head, stage 2: Secondary | ICD-10-CM | POA: Diagnosis not present

## 2020-09-18 DIAGNOSIS — T82838A Hemorrhage of vascular prosthetic devices, implants and grafts, initial encounter: Secondary | ICD-10-CM | POA: Diagnosis not present

## 2020-09-18 DIAGNOSIS — R579 Shock, unspecified: Secondary | ICD-10-CM | POA: Diagnosis not present

## 2020-09-18 DIAGNOSIS — I82451 Acute embolism and thrombosis of right peroneal vein: Secondary | ICD-10-CM | POA: Diagnosis not present

## 2020-09-18 DIAGNOSIS — Z4682 Encounter for fitting and adjustment of non-vascular catheter: Secondary | ICD-10-CM | POA: Diagnosis not present

## 2020-09-18 DIAGNOSIS — J8489 Other specified interstitial pulmonary diseases: Secondary | ICD-10-CM | POA: Diagnosis not present

## 2020-09-18 DIAGNOSIS — R58 Hemorrhage, not elsewhere classified: Secondary | ICD-10-CM | POA: Diagnosis not present

## 2020-09-18 DIAGNOSIS — J9690 Respiratory failure, unspecified, unspecified whether with hypoxia or hypercapnia: Secondary | ICD-10-CM | POA: Diagnosis not present

## 2020-09-18 DIAGNOSIS — I82441 Acute embolism and thrombosis of right tibial vein: Secondary | ICD-10-CM | POA: Diagnosis not present

## 2020-09-18 DIAGNOSIS — K7689 Other specified diseases of liver: Secondary | ICD-10-CM | POA: Diagnosis present

## 2020-09-18 DIAGNOSIS — L89626 Pressure-induced deep tissue damage of left heel: Secondary | ICD-10-CM | POA: Diagnosis not present

## 2020-09-18 DIAGNOSIS — Z452 Encounter for adjustment and management of vascular access device: Secondary | ICD-10-CM | POA: Diagnosis not present

## 2020-09-18 DIAGNOSIS — J1282 Pneumonia due to coronavirus disease 2019: Secondary | ICD-10-CM | POA: Diagnosis not present

## 2020-09-18 DIAGNOSIS — J189 Pneumonia, unspecified organism: Secondary | ICD-10-CM | POA: Diagnosis not present

## 2020-09-18 DIAGNOSIS — Y838 Other surgical procedures as the cause of abnormal reaction of the patient, or of later complication, without mention of misadventure at the time of the procedure: Secondary | ICD-10-CM | POA: Diagnosis not present

## 2020-09-18 DIAGNOSIS — D689 Coagulation defect, unspecified: Secondary | ICD-10-CM | POA: Diagnosis not present

## 2020-09-18 DIAGNOSIS — U071 COVID-19: Secondary | ICD-10-CM | POA: Diagnosis not present

## 2020-09-18 DIAGNOSIS — E870 Hyperosmolality and hypernatremia: Secondary | ICD-10-CM | POA: Diagnosis not present

## 2020-09-18 DIAGNOSIS — G9340 Encephalopathy, unspecified: Secondary | ICD-10-CM | POA: Diagnosis not present

## 2020-09-18 DIAGNOSIS — R918 Other nonspecific abnormal finding of lung field: Secondary | ICD-10-CM | POA: Diagnosis not present

## 2020-09-18 DIAGNOSIS — J159 Unspecified bacterial pneumonia: Secondary | ICD-10-CM | POA: Diagnosis present

## 2020-09-18 DIAGNOSIS — R7989 Other specified abnormal findings of blood chemistry: Secondary | ICD-10-CM | POA: Diagnosis not present

## 2020-09-18 DIAGNOSIS — G4733 Obstructive sleep apnea (adult) (pediatric): Secondary | ICD-10-CM | POA: Diagnosis present

## 2020-09-18 DIAGNOSIS — J9601 Acute respiratory failure with hypoxia: Secondary | ICD-10-CM | POA: Diagnosis not present

## 2020-09-18 DIAGNOSIS — E875 Hyperkalemia: Secondary | ICD-10-CM | POA: Diagnosis not present

## 2020-09-18 DIAGNOSIS — R55 Syncope and collapse: Secondary | ICD-10-CM | POA: Diagnosis not present

## 2020-09-18 DIAGNOSIS — J90 Pleural effusion, not elsewhere classified: Secondary | ICD-10-CM | POA: Diagnosis not present

## 2020-09-18 DIAGNOSIS — R0902 Hypoxemia: Secondary | ICD-10-CM | POA: Diagnosis not present

## 2020-09-18 DIAGNOSIS — Z515 Encounter for palliative care: Secondary | ICD-10-CM | POA: Diagnosis not present

## 2020-09-18 DIAGNOSIS — R0602 Shortness of breath: Secondary | ICD-10-CM | POA: Diagnosis not present

## 2020-09-18 DIAGNOSIS — J984 Other disorders of lung: Secondary | ICD-10-CM | POA: Diagnosis not present

## 2020-09-18 DIAGNOSIS — Z0184 Encounter for antibody response examination: Secondary | ICD-10-CM | POA: Diagnosis not present

## 2020-09-18 DIAGNOSIS — J9600 Acute respiratory failure, unspecified whether with hypoxia or hypercapnia: Secondary | ICD-10-CM | POA: Diagnosis not present

## 2020-09-18 DIAGNOSIS — I517 Cardiomegaly: Secondary | ICD-10-CM | POA: Diagnosis not present

## 2020-09-18 DIAGNOSIS — R0603 Acute respiratory distress: Secondary | ICD-10-CM | POA: Diagnosis not present

## 2020-09-18 DIAGNOSIS — N170 Acute kidney failure with tubular necrosis: Secondary | ICD-10-CM | POA: Diagnosis not present

## 2020-09-18 DIAGNOSIS — Z66 Do not resuscitate: Secondary | ICD-10-CM | POA: Diagnosis not present

## 2020-09-18 DIAGNOSIS — J9811 Atelectasis: Secondary | ICD-10-CM | POA: Diagnosis not present

## 2020-09-18 DIAGNOSIS — J80 Acute respiratory distress syndrome: Secondary | ICD-10-CM | POA: Diagnosis not present

## 2020-09-18 DIAGNOSIS — I491 Atrial premature depolarization: Secondary | ICD-10-CM | POA: Diagnosis not present

## 2020-09-18 DIAGNOSIS — A4189 Other specified sepsis: Secondary | ICD-10-CM | POA: Diagnosis not present

## 2020-09-18 DIAGNOSIS — D65 Disseminated intravascular coagulation [defibrination syndrome]: Secondary | ICD-10-CM | POA: Diagnosis not present

## 2020-09-18 DIAGNOSIS — L89616 Pressure-induced deep tissue damage of right heel: Secondary | ICD-10-CM | POA: Diagnosis not present

## 2020-09-18 DIAGNOSIS — R6521 Severe sepsis with septic shock: Secondary | ICD-10-CM | POA: Diagnosis not present

## 2020-09-18 LAB — BRAIN NATRIURETIC PEPTIDE: B Natriuretic Peptide: 103.1 pg/mL — ABNORMAL HIGH (ref 0.0–100.0)

## 2020-09-18 LAB — MRSA PCR SCREENING: MRSA by PCR: NEGATIVE

## 2020-09-18 MED ORDER — METHYLPREDNISOLONE SODIUM SUCC 125 MG IJ SOLR
60.0000 mg | Freq: Two times a day (BID) | INTRAMUSCULAR | Status: DC
  Administered 2020-09-18 – 2020-09-25 (×15): 60 mg via INTRAVENOUS
  Filled 2020-09-18 (×15): qty 2

## 2020-09-18 MED ORDER — LATANOPROST 0.005 % OP SOLN
1.0000 [drp] | Freq: Every day | OPHTHALMIC | Status: DC
  Administered 2020-09-18 – 2020-10-08 (×21): 1 [drp] via OPHTHALMIC
  Filled 2020-09-18 (×2): qty 2.5

## 2020-09-18 MED ORDER — ENOXAPARIN SODIUM 60 MG/0.6ML ~~LOC~~ SOLN
60.0000 mg | Freq: Every day | SUBCUTANEOUS | Status: DC
  Administered 2020-09-18 – 2020-09-20 (×3): 60 mg via SUBCUTANEOUS
  Filled 2020-09-18 (×3): qty 0.6

## 2020-09-18 MED ORDER — BARICITINIB 2 MG PO TABS: 4.0000 mg | ORAL_TABLET | Freq: Every day | ORAL | Status: DC

## 2020-09-18 MED ORDER — DM-GUAIFENESIN ER 30-600 MG PO TB12
1.0000 | ORAL_TABLET | Freq: Two times a day (BID) | ORAL | Status: DC | PRN
  Administered 2020-09-24: 1 via ORAL
  Filled 2020-09-18 (×2): qty 1

## 2020-09-18 MED ORDER — TOCILIZUMAB 400 MG/20ML IV SOLN
800.0000 mg | Freq: Once | INTRAVENOUS | Status: AC
  Administered 2020-09-18: 800 mg via INTRAVENOUS
  Filled 2020-09-18: qty 40

## 2020-09-18 NOTE — Evaluation (Signed)
Occupational Therapy Evaluation Patient Details Name: Javier Peterson MRN: [id number] DOB: 18-Mar-1946 Today's Date: 09/18/2020    History of Present Illness 74 y.o. male with medical history significant of glaucoma.  Pt presents to ED [facility]/25/2020 at [facility] with c/o fever, SOB; diagnosed with prostatitis 12/3 and started cipro with dysuria resolved but continues to have fever up to 102.6, cough, N/V, and syncope episode at home. +COVID (12/7); not vaccinated   Clinical Impression   PTA pt living with wife in senior living community and functioning at independent community level. At time of eval, pt up in chair on 15L HFNC and able to complete sit <> stand transfers with supervision assist. He then completed 2 laps in room with cues and education for pursed lip breathing. Pt with increasing O2 demands this session (see below for details). Noted decreased awareness of safety/deficits and slow processing throughout session. Pt is somewhat aware of this stating he feels he has "COVID brain". Pt will continue to beenfit from skilled OT to progress knowledge of energy conservation with ADL progression. Anticipate he will not need post acute follow up. Will follow per POC listed below.  Vitals: Pt started session on 15L HFNC with SpO2 ~92%. With initial mobility, pt O2 sats as low as 85%. Additional 15L NRB added with sats 89-91%. After first lap, SpO2 up to 94%. 15L NRB removed and pt maintained remainder of mobility on 15L HFNC, which he was left on at end of session.     Follow Up Recommendations  No OT follow up;Supervision - Intermittent    Equipment Recommendations  Tub/shower seat    Recommendations for Other Services       Precautions / Restrictions Precautions Precautions: Fall Restrictions Weight Bearing Restrictions: No      Mobility Bed Mobility               General bed mobility comments: up in chair, returned to chair    Transfers Overall transfer level: Needs  assistance Equipment used: None Transfers: Sit to/from Stand Sit to Stand: Supervision         General transfer comment: supervision for line management    Balance Overall balance assessment: Mild deficits observed, not formally tested                                         ADL either performed or assessed with clinical judgement   ADL Overall ADL's : Modified independent                                       General ADL Comments: Pt demonstrates ability to complete ADL at mod I level (increased time, no device) but requires consistent education and cueing for energy conservation skill implementation. Pt has poor deficit awareness     Vision Baseline Vision/History: Wears glasses Wears Glasses: At all times Patient Visual Report: No change from baseline       Perception     Praxis      Pertinent Vitals/Pain Pain Assessment: No/denies pain     Hand Dominance     Extremity/Trunk Assessment Upper Extremity Assessment Upper Extremity Assessment: Overall WFL for tasks assessed   Lower Extremity Assessment Lower Extremity Assessment: Defer to PT evaluation       Communication Communication Communication: No difficulties   Cognition  Arousal/Alertness: Awake/alert Behavior During Therapy: WFL for tasks assessed/performed Overall Cognitive Status: Impaired/Different from baseline Area of Impairment: Safety/judgement;Problem solving                         Safety/Judgement: Decreased awareness of safety;Decreased awareness of deficits   Problem Solving: Slow processing General Comments: noted slow processing and decreased awareness of safety throughout sessions. ASking why he cannot keep walking further after OT/PT had just explained energy conservation and COVID symptoms. Was speaking of his friends that are MDs that are coming to "visit his room". At first not able to orient to date (one day off), but later able to  recall   General Comments       Exercises Other Exercises Other Exercises: IS and flutter training with min cues for good technique   Shoulder Instructions      Home Living Family/patient expects to be discharged to:: Private residence Living Arrangements: Spouse/significant other Available Help at Discharge: Family;Available 24 hours/day Type of Home: House Home Access: Level entry     Home Layout: One level     Bathroom Shower/Tub: Occupational psychologist: Handicapped height     Home Equipment: None   Additional Comments: lives in senior living community with wife- both are independent      Prior Functioning/Environment Level of Independence: Independent                 OT Problem List:        OT Treatment/Interventions: Self-care/ADL training;Therapeutic exercise;Patient/family education;Balance training;Energy conservation;Therapeutic activities;DME and/or AE instruction    OT Goals(Current goals can be found in the care plan section) Acute Rehab OT Goals Patient Stated Goal: return back to ministry OT Goal Formulation: With patient Time For Goal Achievement: 10/02/20 Potential to Achieve Goals: Good  OT Frequency: Min 3X/week   Barriers to D/C:            Co-evaluation              AM-PAC OT "6 Clicks" Daily Activity     Outcome Measure Help from another person eating meals?: None Help from another person taking care of personal grooming?: None Help from another person toileting, which includes using toliet, bedpan, or urinal?: None Help from another person bathing (including washing, rinsing, drying)?: None Help from another person to put on and taking off regular upper body clothing?: None Help from another person to put on and taking off regular lower body clothing?: None 6 Click Score: 24   End of Session Equipment Utilized During Treatment: Oxygen Nurse Communication: Mobility status  Activity Tolerance: Patient  tolerated treatment well Patient left: in chair;with call bell/phone within reach  OT Visit Diagnosis: Other abnormalities of gait and mobility (R26.89)                Time: 2025-4270 OT Time Calculation (min): 29 min Charges:  OT General Charges $OT Visit: 1 Visit OT Evaluation $OT Eval Moderate Complexity: [place], MSOT, OTR/L Acute Rehabilitation Services [facility] Office Number: [phone] Pager: [phone]  Zenovia Jarred 09/18/2020, 5:51 PM

## 2020-09-18 NOTE — Progress Notes (Signed)
PT Cancellation Note  Patient Details Name: GERAL COKER MRN: [id number] DOB: [birth year]   Cancelled Treatment:    Reason Eval/Treat Not Completed: Patient at procedure or test/unavailable  Patient doing meds with RN. Will return to see today   Arby Barrette, PT Pager [phone]   Rexanne Mano 09/18/2020, 9:39 AM

## 2020-09-18 NOTE — Evaluation (Addendum)
Physical Therapy Evaluation Patient Details Name: Javier Peterson MRN: [id number] DOB: 04-13-1946 Today's Date: 09/18/2020   History of Present Illness  74 y.o. male with medical history significant of glaucoma.  Pt presents to ED 09/27/2020 at [facility] with c/o fever, SOB; diagnosed with prostatitis 12/3 and started cipro with dysuria resolved but continues to have fever up to 102.6, cough, N/V, and syncope episode at home. +COVID (12/7); not vaccinated  Clinical Impression   Pt admitted with above diagnosis. Patient was completely independent and working in [employer] prior to admission. Currently he required mod cues for maintaining appropriate pace for walking and use of pursed lip breathing (while on 15L HFNC+NRB (15L). Patient with poor awareness of the severity of his illness and wanting to push himself to physically do more and required repeated education re: oxygen saturation and amount of oxygen he required to walk 50 ft.  Pt currently with functional limitations due to the deficits listed below (see PT Problem List). Pt will benefit from skilled PT to increase their independence and safety with mobility to allow discharge to the venue listed below.   Vitals: Pt started session on 15L HFNC with SpO2 ~92%. With initial mobility, pt O2 sats as low as 85%. Additional 15L NRB added with sats 89-91%. After first lap, SpO2 up to 94%. 15L NRB removed and pt maintained remainder of mobility on 15L HFNC, which he was left on at end of session.      Follow Up Recommendations Home health PT;Supervision/Assistance - 24 hour (24/7 supervision due to decr cognition)    Equipment Recommendations  None recommended by PT    Recommendations for Other Services       Precautions / Restrictions Precautions Precautions: Fall Restrictions Weight Bearing Restrictions: No      Mobility  Bed Mobility               General bed mobility comments: up in chair, returned to chair     Transfers Overall transfer level: Needs assistance Equipment used: None Transfers: Sit to/from Stand Sit to Stand: Supervision         General transfer comment: supervision for line management  Ambulation/Gait Ambulation/Gait assistance: Min guard Gait Distance (Feet): 50 Feet (seated rest; 50) Assistive device: None Gait Pattern/deviations: Step-through pattern;Decreased stride length;Wide base of support     General Gait Details: vc for slower gait to maintain sats  Stairs            Wheelchair Mobility    Modified Rankin (Stroke Patients Only)       Balance Overall balance assessment: Mild deficits observed, not formally tested                                           Pertinent Vitals/Pain Pain Assessment: No/denies pain    Home Living Family/patient expects to be discharged to:: Private residence Living Arrangements: Spouse/significant other Available Help at Discharge: Family;Available 24 hours/day Type of Home: House Home Access: Level entry     Home Layout: One level Home Equipment: None Additional Comments: lives in senior living community with wife- both are independent    Prior Function Level of Independence: Independent               Hand Dominance        Extremity/Trunk Assessment   Upper Extremity Assessment Upper Extremity Assessment: Defer to OT evaluation  Lower Extremity Assessment Lower Extremity Assessment: Generalized weakness    Cervical / Trunk Assessment Cervical / Trunk Assessment: Normal  Communication   Communication: No difficulties  Cognition Arousal/Alertness: Awake/alert Behavior During Therapy: WFL for tasks assessed/performed Overall Cognitive Status: Impaired/Different from baseline Area of Impairment: Safety/judgement;Problem solving                         Safety/Judgement: Decreased awareness of safety;Decreased awareness of deficits   Problem Solving: Slow  processing General Comments: noted slow processing and decreased awareness of safety throughout sessions. ASking why he cannot keep walking further after OT/PT had just explained energy conservation and COVID symptoms. Was speaking of his friends that are MDs that are coming to "visit his room". At first not able to orient to date (one day off), but later able to recall      General Comments      Exercises Other Exercises Other Exercises: IS and flutter training with min cues for good technique   Assessment/Plan    PT Assessment Patient needs continued PT services  PT Problem List Decreased strength;Decreased activity tolerance;Decreased balance;Decreased mobility;Decreased cognition;Decreased knowledge of use of DME;Decreased safety awareness;Cardiopulmonary status limiting activity       PT Treatment Interventions DME instruction;Gait training;Functional mobility training;Therapeutic activities;Therapeutic exercise;Balance training;Cognitive remediation;Patient/family education    PT Goals (Current goals can be found in the Care Plan section)  Acute Rehab PT Goals Patient Stated Goal: return back to ministry PT Goal Formulation: With patient Time For Goal Achievement: 10/02/20 Potential to Achieve Goals: Good Additional Goals Additional Goal #1: Demonstrate proper use of IS and flutter valve. States correct frequency of use.    Frequency Min 3X/week   Barriers to discharge        Co-evaluation PT/OT/SLP Co-Evaluation/Treatment: Yes Reason for Co-Treatment: Complexity of the patient's impairments (multi-system involvement);For patient/therapist safety;To address functional/ADL transfers PT goals addressed during session: Mobility/safety with mobility;Balance         AM-PAC PT "6 Clicks" Mobility  Outcome Measure Help needed turning from your back to your side while in a flat bed without using bedrails?: None Help needed moving from lying on your back to sitting on the  side of a flat bed without using bedrails?: A Little Help needed moving to and from a bed to a chair (including a wheelchair)?: A Little Help needed standing up from a chair using your arms (e.g., wheelchair or bedside chair)?: A Little Help needed to walk in hospital room?: A Little Help needed climbing 3-5 steps with a railing? : Total 6 Click Score: 17    End of Session Equipment Utilized During Treatment: Gait belt;Oxygen Activity Tolerance: Treatment limited secondary to medical complications (Comment) Patient left: in chair;with call bell/phone within reach Nurse Communication: Mobility status;Other (comment) (issued partial NRB for ambulation) PT Visit Diagnosis: Difficulty in walking, not elsewhere classified (R26.2);Muscle weakness (generalized) (M62.81)    Time: 1937-9024 PT Time Calculation (min) (ACUTE ONLY): 29 min   Charges:   PT Evaluation $PT Eval Moderate Complexity: 1 Mod           Arby Barrette, PT Pager [phone]   Rexanne Mano 09/18/2020, 7:10 PM

## 2020-09-18 NOTE — Progress Notes (Signed)
PROGRESS NOTE                                                                                                                                                                                                             Patient Demographics:    Javier Peterson, is a 74 y.o. male, DOB - 1946/05/18, RKY:[id number]  Outpatient Primary MD for the patient is Patient, No Pcp Per    LOS - 0  Admit date - 10/01/2020    Chief Complaint  Patient presents with  . Fever       Brief Narrative (HPI from H&P)  - Javier SHINAULT is a 74 y.o. male with medical history significant of Glaucoma.  Pt presents to ED at [facility] with c/o fever, SOB.  Feeling unwell since Thursday, diagnosed with prostatitis on Friday and started cipro on Friday.  Dysuria has since resolved but continues to have fever up to 102.6, cough for past 2-3 days, N/V, syncope episode at home.  No leg swelling/pain, no abd pain, not vaccinated to COVID, was admitted with Acute Hypoxic Resp. failure due to Covid Pneumonia.   Subjective:    Javier Peterson today has, No headache, No chest pain, No abdominal pain - No Nausea, No new weakness tingling or numbness, +ve SOB.   Assessment  & Plan :     1. Acute Hypoxic Resp. Failure due to Acute Covid 19 Viral Pneumonitis during the ongoing 2020 Covid 19 Pandemic - he is unfortunately not vaccinated and he has incurred severe parenchymal lung injury due to COVID-19 pneumonia.  He has been placed on IV steroids, Remdesivir and Actemra.  He has consented for Actemra use. Overall extremely tenuous.  Encouraged the patient to sit up in chair in the daytime use I-S and flutter valve for pulmonary toiletry and then prone in bed when at night.  Will advance activity and titrate down oxygen as possible.  Actemra/Baricitinib  off label use - patient was told that if COVID-19 pneumonitis gets worse we might potentially use Actemra off  label, patient denies any known history of active diverticulitis, tuberculosis or hepatitis, understands the risks and benefits and wants to proceed with Actemra treatment if required.   SpO2: (!) 86 % O2 Flow Rate (L/min): 12 L/min  Recent Labs  Lab 09/15/2020 2028 09/10/2020 2042 09/12/2020 2320 09/17/20 0510 09/18/20  0732  WBC  --  5.5  --  5.5  --   HGB  --  13.9  --  13.7  --   HCT  --  40.4  --  39.9  --   PLT  --  122*  --  115*  --   CRP  --   --   --  5.8*  --   BNP  --   --   --   --  103.1*  DDIMER  --  1.06*  --  0.71*  --   PROCALCITON  --   --   --  <0.10  --   AST  --  49*  --  50*  --   ALT  --  34  --  32  --   ALKPHOS  --  51  --  50  --   BILITOT  --  0.8  --  0.6  --   ALBUMIN  --  3.7  --  3.3*  --   INR  --  1.1  --   --   --   LATICACIDVEN  --  1.3 1.5  --   --   SARSCOV2NAA POSITIVE*  --   --   --   --            Condition - Extremely Guarded  Family Communication  :  Wife Judeen Hammans [phone] on 09/18/20  Code Status :  Full  Consults  :  None  Procedures  :  None  PUD Prophylaxis : PPI  Disposition Plan  :    Status is: Inpatient  Remains inpatient appropriate because:IV treatments appropriate due to intensity of illness or inability to take PO   Dispo: The patient is from: Home              Anticipated d/c is to: Home              Anticipated d/c date is: > 3 days              Patient currently is not medically stable to d/c.  DVT Prophylaxis  :  Lovenox   Lab Results  Component Value Date   PLT 115 (L) 09/17/2020    Diet :  Diet Order            Diet Heart Room service appropriate? Yes; Fluid consistency: Thin  Diet effective now                  Inpatient Medications  Scheduled Meds: . albuterol  2 puff Inhalation TID  . vitamin C  500 mg Oral Daily  . enoxaparin (LOVENOX) injection  60 mg Subcutaneous Q0600  . latanoprost  1 drop Both Eyes QHS  . methylPREDNISolone (SOLU-MEDROL) injection  60 mg Intravenous Q12H   . zinc sulfate  220 mg Oral Daily   Continuous Infusions: . remdesivir 100 mg in NS 100 mL    . tocilizumab (ACTEMRA) - non-COVID treatment 800 mg (09/18/20 0938)   PRN Meds:.acetaminophen, albuterol, dextromethorphan-guaiFENesin, guaiFENesin-dextromethorphan, ondansetron (ZOFRAN) IV  Antibiotics  :    Anti-infectives (From admission, onward)   Start     Dose/Rate Route Frequency Ordered Stop   09/18/20 1000  remdesivir 100 mg in sodium chloride 0.9 % 100 mL IVPB  Status:  Discontinued        100 mg 200 mL/hr over 30 Minutes Intravenous Daily 09/17/20 0010 09/17/20 0908   09/17/20 1000  remdesivir 100 mg in sodium  chloride 0.9 % 100 mL IVPB  Status:  Discontinued       "Followed by" Linked Group Details   100 mg 200 mL/hr over 30 Minutes Intravenous Daily 09/29/2020 2340 09/17/20 0009   09/17/20 0908  remdesivir 100 mg in sodium chloride 0.9 % 100 mL IVPB        100 mg 200 mL/hr over 30 Minutes Intravenous Daily 09/17/20 0908 09/21/20 0959   09/17/20 0015  remdesivir 100 mg in sodium chloride 0.9 % 100 mL IVPB        100 mg 200 mL/hr over 30 Minutes Intravenous Every 30 min 09/17/20 0010 09/17/20 0114   09/29/2020 2345  remdesivir 200 mg in sodium chloride 0.9% 250 mL IVPB  Status:  Discontinued       "Followed by" Linked Group Details   200 mg 580 mL/hr over 30 Minutes Intravenous Once 09/15/2020 2340 09/17/20 0009       Time Spent in minutes  30   Lala Lund M.D on 09/18/2020 at 10:34 AM  To page go to [email or web address]   [facility] -  Office  [phone]   See all Orders from today for further details    Objective:   Vitals:   09/18/20 0234 09/18/20 0434 09/18/20 0606 09/18/20 0720  BP:   120/73 109/65  Pulse:  73 62 62  Resp:  19 20 17   Temp:  98.3 F (36.8 C) 98.3 F (36.8 C) 98.3 F (36.8 C)  TempSrc:  Oral  Oral  SpO2:  91% 91% (!) 86%  Weight: 119.5 kg     Height: 6\' 2"  (1.88 m)       Wt Readings from Last 3 Encounters:  09/18/20 119.5 kg   05/22/17 124.3 kg  04/06/17 126.6 kg     Intake/Output Summary (Last 24 hours) at 09/18/2020 1034 Last data filed at 09/17/2020 2133 Gross per 24 hour  Intake 2195.29 ml  Output --  Net 2195.29 ml     Physical Exam  Awake Alert, No new F.N deficits, Normal affect [place].AT,PERRAL Supple Neck,No JVD, No cervical lymphadenopathy appriciated.  Symmetrical Chest wall movement, Good air movement bilaterally, CTAB RRR,No Gallops,Rubs or new Murmurs, No Parasternal Heave +ve B.Sounds, Abd Soft, No tenderness, No organomegaly appriciated, No rebound - guarding or rigidity. No Cyanosis, Clubbing or edema, No new Rash or bruise      Data Review:    CBC Recent Labs  Lab 10/08/2020 2042 09/17/20 0510  WBC 5.5 5.5  HGB 13.9 13.7  HCT 40.4 39.9  PLT 122* 115*  MCV 94.8 95.5  MCH 32.6 32.8  MCHC 34.4 34.3  RDW 13.3 13.2  LYMPHSABS 0.4* 0.4*  MONOABS 0.2 0.1  EOSABS 0.0 0.0  BASOSABS 0.0 0.0    Recent Labs  Lab 09/24/2020 2042 09/12/2020 2320 09/17/20 0510 09/18/20 0732  NA 132*  --  133*  --   K 3.4*  --  3.8  --   CL 99  --  98  --   CO2 22  --  26  --   GLUCOSE 130*  --  135*  --   BUN 21  --  19  --   CREATININE 0.98  --  0.90  --   CALCIUM 8.3*  --  8.1*  --   AST 49*  --  50*  --   ALT 34  --  32  --   ALKPHOS 51  --  50  --   BILITOT 0.8  --  0.6  --   ALBUMIN 3.7  --  3.3*  --   MG  --   --  2.0  --   CRP  --   --  5.8*  --   DDIMER 1.06*  --  0.71*  --   PROCALCITON  --   --  <0.10  --   LATICACIDVEN 1.3 1.5  --   --   INR 1.1  --   --   --   TSH  --   --  0.791  --   BNP  --   --   --  103.1*    ------------------------------------------------------------------------------------------------------------------ No results for input(s): CHOL, HDL, LDLCALC, TRIG, CHOLHDL, LDLDIRECT in the last 72 hours.  No results found for: HGBA1C ------------------------------------------------------------------------------------------------------------------ Recent Labs     09/17/20 0510  TSH 0.791    Cardiac Enzymes No results for input(s): CKMB, TROPONINI, MYOGLOBIN in the last 168 hours.  Invalid input(s): CK ------------------------------------------------------------------------------------------------------------------    Component Value Date/Time   BNP 103.1 (H) 09/18/2020 0732    Micro Results Recent Results (from the past 240 hour(s))  Resp Panel by RT-PCR (Flu A&B, Covid) Nasopharyngeal Swab     Status: Abnormal   Collection Time: 09/24/2020  8:28 PM   Specimen: Nasopharyngeal Swab; Nasopharyngeal(NP) swabs in vial transport medium  Result Value Ref Range Status   SARS Coronavirus 2 by RT PCR POSITIVE (A) NEGATIVE Final    Comment: RESULT CALLED TO, READ BACK BY AND VERIFIED WITH: ADKINS,L AT 2116 ON [id number] BY CHERESNOWSKY,T (NOTE) SARS-CoV-2 target nucleic acids are DETECTED.  The SARS-CoV-2 RNA is generally detectable in upper respiratory specimens during the acute phase of infection. Positive results are indicative of the presence of the identified virus, but do not rule out bacterial infection or co-infection with other pathogens not detected by the test. Clinical correlation with patient history and other diagnostic information is necessary to determine patient infection status. The expected result is Negative.  Fact Sheet for Patients: EntrepreneurPulse.com.au  Fact Sheet for Healthcare Providers: IncredibleEmployment.be  This test is not yet approved or cleared by the [place] FDA and  has been authorized for detection and/or diagnosis of SARS-CoV-2 by FDA under an Emergency Use Authorization (EUA).  This EUA will remain in effect (meaning this  test can be used) for the duration of  the COVID-19 declaration under Section 564(b)(1) of the Act, 21 U.S.C. section 360bbb-3(b)(1), unless the authorization is terminated or revoked sooner.     Influenza A by PCR NEGATIVE NEGATIVE  Final   Influenza B by PCR NEGATIVE NEGATIVE Final    Comment: (NOTE) The Xpert Xpress SARS-CoV-2/FLU/RSV plus assay is intended as an aid in the diagnosis of influenza from Nasopharyngeal swab specimens and should not be used as a sole basis for treatment. Nasal washings and aspirates are unacceptable for Xpert Xpress SARS-CoV-2/FLU/RSV testing.  Fact Sheet for Patients: EntrepreneurPulse.com.au  Fact Sheet for Healthcare Providers: IncredibleEmployment.be  This test is not yet approved or cleared by the [place] FDA and has been authorized for detection and/or diagnosis of SARS-CoV-2 by FDA under an Emergency Use Authorization (EUA). This EUA will remain in effect (meaning this test can be used) for the duration of the COVID-19 declaration under Section 564(b)(1) of the Act, 21 U.S.C. section 360bbb-3(b)(1), unless the authorization is terminated or revoked.  Performed at [facility], [address] [place], [place] [address]   Culture, blood (Routine x 2)     Status: None (Preliminary  result)   Collection Time: 09/28/2020  8:43 PM   Specimen: BLOOD  Result Value Ref Range Status   Specimen Description   Final    BLOOD BLOOD RIGHT FOREARM Performed at [facility], [address] [place], [place] [address]    Special Requests   Final    BOTTLES DRAWN AEROBIC AND ANAEROBIC Blood Culture adequate volume Performed at [facility], [address] [place], [place] [address]    Culture   Final    NO GROWTH 2 DAYS Performed at [facility], [place] [address], [place], [place] [address]    Report Status PENDING  Incomplete  Culture, blood (Routine x 2)     Status: None (Preliminary result)   Collection Time: 09/14/2020  9:09 PM   Specimen: BLOOD  Result Value Ref Range Status   Specimen Description   Final    BLOOD BLOOD LEFT HAND Performed at [facility], [address] [place], [place] [address]    Special Requests   Final    BOTTLES DRAWN AEROBIC AND ANAEROBIC Blood Culture adequate volume Performed at [facility], [address] [place], [place] [address]    Culture   Final    NO GROWTH 2 DAYS Performed at [facility], [place] [address], [place], [place] [address]    Report Status PENDING  Incomplete  MRSA PCR Screening     Status: None   Collection Time: 09/18/20  7:51 AM   Specimen: Nasopharyngeal  Result Value Ref Range Status   MRSA by PCR NEGATIVE NEGATIVE Final    Comment:        The GeneXpert MRSA Assay (FDA approved for NASAL specimens only), is one component of a comprehensive MRSA colonization surveillance program. It is not intended to diagnose MRSA infection nor to guide or monitor treatment for MRSA infections. Performed at [facility], [place] [address] [place], [place] [address]     Radiology Reports DG Chest Portable 1 View  Result Date: 09/11/2020 CLINICAL DATA:  Fever EXAM: PORTABLE CHEST 1 VIEW COMPARISON:  None. FINDINGS: There are hazy bilateral airspace opacities. The heart size is mildly enlarged. There is no pneumothorax or large pleural effusion. No acute osseous abnormality. IMPRESSION: Hazy bilateral airspace opacities concerning for multifocal pneumonia (viral or bacterial). Electronically Signed   By: Constance Holster M.D.   On: 09/19/2020 21:13

## 2020-09-18 NOTE — H&P (Addendum)
History and Physical    Javier Peterson LGX:[id number] DOB: Sep 24, 1946 DOA: 09/21/2020  PCP: Patient, No Pcp Per  Patient coming from: Home  I have personally briefly reviewed patient's old medical records in [place]  Chief Complaint: SOB  HPI: Javier Peterson is a 74 y.o. male with medical history significant of Glaucoma.  Pt presents to ED at [facility] with c/o fever, SOB.  Feeling unwell since Thursday, diagnosed with prostatitis on Friday and started cipro on Friday.  Dysuria has since resolved but continues to have fever up to 102.6, cough for past 2-3 days, N/V, syncope episode at home.  No leg swelling/pain, no abd pain, not vaccinated to COVID.   ED Course: Pt with COVID-19 PNA.  New O2 requirement of 8-9L to maintain sat of 90 currently.  WBC nl, procalcitonin negative, UA neg.  Got 200mg  load of remdesivir in ED and decadron.  Refused 2nd dose of remdesivir because he "heard bad things about it".   Review of Systems: As per HPI, otherwise all review of systems negative.  Past Medical History:  Diagnosis Date  . Glaucoma   . OSA (obstructive sleep apnea)   . Skin cancer     Past Surgical History:  Procedure Laterality Date  . VASECTOMY       reports that he has never smoked. He has never used smokeless tobacco. He reports that he does not drink alcohol and does not use drugs.  No Known Allergies  Family History  Problem Relation Age of Onset  . Diabetes Father    No reported sick contacts in family.  Prior to Admission medications   Medication Sig Start Date End Date Taking? Authorizing Provider  Ascorbic Acid (SUPER C COMPLEX PO) Take 1 tablet by mouth daily.   Yes [provider]  ciprofloxacin (CIPRO) 500 MG tablet Take 500 mg by mouth 2 (two) times daily.   Yes [provider]  dextromethorphan-guaiFENesin (MUCINEX DM) 30-600 MG 12hr tablet Take 1 tablet by mouth 2 (two) times daily as needed for cough.   Yes [provider]  Glucosamine HCl (GLUCOSAMINE PO) Take 1 tablet by mouth daily.   Yes [provider]  latanoprost (XALATAN) 0.005 % ophthalmic solution Place 1 drop into both eyes at bedtime. 08/26/20  Yes [provider]  Multiple Vitamins-Minerals (ZINC PO) Take 1 tablet by mouth daily.   Yes [provider]  VITAMIN D PO Take 1 capsule by mouth daily.   Yes [provider]    Physical Exam: Vitals:   09/17/20 2239 09/17/20 2300 09/18/20 0055 09/18/20 0234  BP:  119/60 (!) 149/77   Pulse: 77 74 80   Resp:  (!) 24 20   Temp: (!) 101.9 F (38.8 C)  98.2 F (36.8 C)   TempSrc: Oral  Oral   SpO2:  (!) 89% (!) 89%   Weight:    119.5 kg  Height:    6\' 2"  (1.88 m)    Constitutional: NAD, calm, comfortable Eyes: PERRL, lids and conjunctivae normal ENMT: Mucous membranes are moist. Posterior pharynx clear of any exudate or lesions.Normal dentition.  Neck: normal, supple, no masses, no thyromegaly Respiratory: clear to auscultation bilaterally, no wheezing, no crackles. Normal respiratory effort. No accessory muscle use.  Cardiovascular: Regular rate and rhythm, no murmurs / rubs / gallops. No extremity edema. 2+ pedal pulses. No carotid bruits.  Abdomen: no tenderness, no masses palpated. No hepatosplenomegaly. Bowel sounds positive.  Musculoskeletal: no clubbing / cyanosis.  No joint deformity upper and lower extremities. Good ROM, no contractures. Normal muscle tone.  Skin: no rashes, lesions, ulcers. No induration Neurologic: CN 2-12 grossly intact. Sensation intact, DTR normal. Strength 5/5 in all 4.  Psychiatric: Normal judgment and insight. Alert and oriented x 3. Normal mood.    Labs on Admission: I have personally reviewed following labs and imaging studies  CBC: Recent Labs  Lab 09/18/2020 2042 09/17/20 0510  WBC 5.5 5.5  NEUTROABS 5.0 4.9  HGB 13.9 13.7  HCT 40.4 39.9  MCV 94.8 95.5  PLT 122* 520*   Basic Metabolic Panel: Recent  Labs  Lab 09/26/2020 2042 09/17/20 0510  NA 132* 133*  K 3.4* 3.8  CL 99 98  CO2 22 26  GLUCOSE 130* 135*  BUN 21 19  CREATININE 0.98 0.90  CALCIUM 8.3* 8.1*  MG  --  2.0  PHOS  --  2.4*   GFR: Estimated Creatinine Clearance: 100.4 mL/min (by C-G formula based on SCr of 0.9 mg/dL). Liver Function Tests: Recent Labs  Lab 09/17/2020 2042 09/17/20 0510  AST 49* 50*  ALT 34 32  ALKPHOS 51 50  BILITOT 0.8 0.6  PROT 7.6 6.9  ALBUMIN 3.7 3.3*   No results for input(s): LIPASE, AMYLASE in the last 168 hours. No results for input(s): AMMONIA in the last 168 hours. Coagulation Profile: Recent Labs  Lab 10/08/2020 2042  INR 1.1   Cardiac Enzymes: No results for input(s): CKTOTAL, CKMB, CKMBINDEX, TROPONINI in the last 168 hours. BNP (last 3 results) No results for input(s): PROBNP in the last 8760 hours. HbA1C: No results for input(s): HGBA1C in the last 72 hours. CBG: No results for input(s): GLUCAP in the last 168 hours. Lipid Profile: No results for input(s): CHOL, HDL, LDLCALC, TRIG, CHOLHDL, LDLDIRECT in the last 72 hours. Thyroid Function Tests: Recent Labs    09/17/20 0510  TSH 0.791   Anemia Panel: Recent Labs    09/17/20 0510  VITAMINB12 399  FERRITIN 95   Urine analysis:    Component Value Date/Time   COLORURINE YELLOW 09/17/2020 0011   APPEARANCEUR CLEAR 09/17/2020 0011   LABSPEC >1.030 (H) 09/17/2020 0011   PHURINE 5.5 09/17/2020 0011   GLUCOSEU NEGATIVE 09/17/2020 0011   HGBUR TRACE (A) 09/17/2020 0011   BILIRUBINUR NEGATIVE 09/17/2020 0011   KETONESUR NEGATIVE 09/17/2020 0011   PROTEINUR 100 (A) 09/17/2020 0011   NITRITE NEGATIVE 09/17/2020 0011   LEUKOCYTESUR NEGATIVE 09/17/2020 0011    Radiological Exams on Admission: DG Chest Portable 1 View  Result Date: 09/11/2020 CLINICAL DATA:  Fever EXAM: PORTABLE CHEST 1 VIEW COMPARISON:  None. FINDINGS: There are hazy bilateral airspace opacities. The heart size is mildly enlarged. There is no  pneumothorax or large pleural effusion. No acute osseous abnormality. IMPRESSION: Hazy bilateral airspace opacities concerning for multifocal pneumonia (viral or bacterial). Electronically Signed   By: Constance Holster M.D.   On: 09/21/2020 21:13    EKG: Independently reviewed.  Assessment/Plan Principal Problem:   Acute hypoxemic respiratory failure due to COVID-19 ([place])    1. Acute hypoxic resp failure due to COVID-19 - 1. COVID pathway 2. Convinced pt to resume remdesivir 3. Cont Decadron 4. Starting barcitinib 5. Daily labs 6. Tele monitor 7. Cont pulse ox  DVT prophylaxis: Lovenox Code Status: Full Family Communication: No family in room Disposition Plan: Home after O2 requirement improved Consults called: None Admission status: Admit to inpatient  Severity of Illness: The appropriate patient status for this patient is INPATIENT. Inpatient  status is judged to be reasonable and necessary in order to provide the required intensity of service to ensure the patient's safety. The patient's presenting symptoms, physical exam findings, and initial radiographic and laboratory data in the context of their chronic comorbidities is felt to place them at high risk for further clinical deterioration. Furthermore, it is not anticipated that the patient will be medically stable for discharge from the hospital within 2 midnights of admission. The following factors support the patient status of inpatient.   IP status due to 9L O2 requirement from COVID-19 PNA.   * I certify that at the point of admission it is my clinical judgment that the patient will require inpatient hospital care spanning beyond 2 midnights from the point of admission due to high intensity of service, high risk for further deterioration and high frequency of surveillance required.*    Brett Soza M. DO [facility]  How to contact the [facility] Attending or Consulting provider [place] or covering provider during  after hours [place], for this patient?  1. Check the care team in [facility] and look for a) attending/consulting [facility] provider listed and b) the [facility] team listed 2. Log into [email or web address]  Amion Physician Scheduling and messaging for groups and whole hospitals  On call and physician scheduling software for group practices, residents, hospitalists and other medical providers for call, clinic, rotation and shift schedules. OnCall Enterprise is a hospital-wide system for scheduling doctors and paging doctors on call. EasyPlot is for scientific plotting and data analysis.  [email or web address]  and use [place]'s universal password to access. If you do not have the password, please contact the hospital operator.  3. Locate the [facility] provider you are looking for under [facility] and page to a number that you can be directly reached. 4. If you still have difficulty reaching the provider, please page the [facility] (Director on Call) for the Hospitalists listed on amion for assistance.  09/18/2020, 3:11 AM

## 2020-09-19 ENCOUNTER — Inpatient Hospital Stay (HOSPITAL_COMMUNITY): Payer: Medicare HMO

## 2020-09-19 LAB — CBC WITH DIFFERENTIAL/PLATELET
Abs Immature Granulocytes: 0.03 10*3/uL (ref 0.00–0.07)
Basophils Absolute: 0 10*3/uL (ref 0.0–0.1)
Basophils Relative: 0 %
Eosinophils Absolute: 0 10*3/uL (ref 0.0–0.5)
Eosinophils Relative: 0 %
HCT: 41.4 % (ref 39.0–52.0)
Hemoglobin: 14.2 g/dL (ref 13.0–17.0)
Immature Granulocytes: 0 %
Lymphocytes Relative: 5 %
Lymphs Abs: 0.4 10*3/uL — ABNORMAL LOW (ref 0.7–4.0)
MCH: 32.4 pg (ref 26.0–34.0)
MCHC: 34.3 g/dL (ref 30.0–36.0)
MCV: 94.5 fL (ref 80.0–100.0)
Monocytes Absolute: 0.3 10*3/uL (ref 0.1–1.0)
Monocytes Relative: 4 %
Neutro Abs: 6.7 10*3/uL (ref 1.7–7.7)
Neutrophils Relative %: 91 %
Platelets: 124 10*3/uL — ABNORMAL LOW (ref 150–400)
RBC: 4.38 MIL/uL (ref 4.22–5.81)
RDW: 13.2 % (ref 11.5–15.5)
WBC: 7.4 10*3/uL (ref 4.0–10.5)
nRBC: 0 % (ref 0.0–0.2)

## 2020-09-19 LAB — COMPREHENSIVE METABOLIC PANEL
ALT: 32 U/L (ref 0–44)
AST: 62 U/L — ABNORMAL HIGH (ref 15–41)
Albumin: 2.8 g/dL — ABNORMAL LOW (ref 3.5–5.0)
Alkaline Phosphatase: 48 U/L (ref 38–126)
Anion gap: 9 (ref 5–15)
BUN: 21 mg/dL (ref 8–23)
CO2: 26 mmol/L (ref 22–32)
Calcium: 8.3 mg/dL — ABNORMAL LOW (ref 8.9–10.3)
Chloride: 101 mmol/L (ref 98–111)
Creatinine, Ser: 0.81 mg/dL (ref 0.61–1.24)
GFR, Estimated: >60 mL/min (ref >60)
Glucose, Bld: 134 mg/dL — ABNORMAL HIGH (ref 70–99)
Potassium: 3.8 mmol/L (ref 3.5–5.1)
Sodium: 136 mmol/L (ref 135–145)
Total Bilirubin: 0.8 mg/dL (ref 0.3–1.2)
Total Protein: 6.5 g/dL (ref 6.5–8.1)

## 2020-09-19 LAB — C-REACTIVE PROTEIN: CRP: 4.5 mg/dL — ABNORMAL HIGH (ref <1.0)

## 2020-09-19 LAB — MAGNESIUM: Magnesium: 2.2 mg/dL (ref 1.7–2.4)

## 2020-09-19 LAB — PROCALCITONIN: Procalcitonin: <0.1 ng/mL

## 2020-09-19 LAB — BRAIN NATRIURETIC PEPTIDE: B Natriuretic Peptide: 236.6 pg/mL — ABNORMAL HIGH (ref 0.0–100.0)

## 2020-09-19 LAB — D-DIMER, QUANTITATIVE: D-Dimer, Quant: 0.84 ug/mL-FEU — ABNORMAL HIGH (ref 0.00–0.50)

## 2020-09-19 MED ORDER — TOCILIZUMAB 400 MG/20ML IV SOLN
800.0000 mg | Freq: Once | INTRAVENOUS | Status: AC
  Administered 2020-09-19: 800 mg via INTRAVENOUS
  Filled 2020-09-19: qty 40

## 2020-09-19 MED ORDER — FUROSEMIDE 10 MG/ML IJ SOLN
40.0000 mg | Freq: Once | INTRAMUSCULAR | Status: AC
  Administered 2020-09-19: 40 mg via INTRAVENOUS
  Filled 2020-09-19: qty 4

## 2020-09-19 NOTE — Progress Notes (Signed)
Pt is on 14L [place] and cannot be placed on the cpap (Dream station) due to increased O2 requirements. Pt is doing well at this time, resting comfortably.

## 2020-09-19 NOTE — Progress Notes (Signed)
PROGRESS NOTE                                                                                                                                                                                                             Patient Demographics:    Javier Peterson, is a 74 y.o. male, DOB - [birth year], QIO:[id number]  Outpatient Primary MD for the patient is Patient, No Pcp Per    LOS - 1  Admit date - 10/03/2020    Chief Complaint  Patient presents with  . Fever       Brief Narrative (HPI from H&P)  - Javier Peterson is a 74 y.o. male with medical history significant of Glaucoma.  Pt presents to ED at [facility] with c/o fever, SOB.  Feeling unwell since Thursday, diagnosed with prostatitis on Friday and started cipro on Friday.  Dysuria has since resolved but continues to have fever up to 102.6, cough for past 2-3 days, N/V, syncope episode at home.  No leg swelling/pain, no abd pain, not vaccinated to COVID, was admitted with Acute Hypoxic Resp. failure due to Covid Pneumonia.   Subjective:   Patient in bed, appears comfortable, denies any headache, no fever, no chest pain or pressure, improved shortness of breath , no abdominal pain. No focal weakness.   Assessment  & Plan :     1. Acute Hypoxic Resp. Failure due to Acute Covid 19 Viral Pneumonitis during the ongoing 2020 Covid 19 Pandemic - he is unfortunately not vaccinated and he has incurred severe parenchymal lung injury due to COVID-19 pneumonia.  He has been placed on IV steroids, Remdesivir and Actemra x 1 on 09/18/20.  His clinical condition seems to have stabilized although overall still quite tenuous, will continue to monitor closely.  Encouraged the patient to sit up in chair in the daytime use I-S and flutter valve for pulmonary toiletry and then prone in bed when at night.  Will advance activity and titrate down oxygen as possible.      SpO2: 90 % O2 Flow Rate  (L/min): 15 L/min  Recent Labs  Lab 09/11/2020 2028 10/06/2020 2042 09/11/2020 2320 09/17/20 0510 09/18/20 0732 09/19/20 0114  WBC  --  5.5  --  5.5  --  7.4  HGB  --  13.9  --  13.7  --  14.2  HCT  --  40.4  --  39.9  --  41.4  PLT  --  122*  --  115*  --  124*  CRP  --   --   --  5.8*  --  4.5*  BNP  --   --   --   --  103.1* 236.6*  DDIMER  --  1.06*  --  0.71*  --  0.84*  PROCALCITON  --   --   --  <0.10  --  <0.10  AST  --  49*  --  50*  --  62*  ALT  --  34  --  32  --  32  ALKPHOS  --  51  --  50  --  48  BILITOT  --  0.8  --  0.6  --  0.8  ALBUMIN  --  3.7  --  3.3*  --  2.8*  INR  --  1.1  --   --   --   --   LATICACIDVEN  --  1.3 1.5  --   --   --   SARSCOV2NAA POSITIVE*  --   --   --   --   --          Condition - Extremely Guarded  Family Communication  :  Wife Judeen Hammans [phone] on 09/18/20  Code Status :  Full  Consults  :  None  Procedures  :  None  PUD Prophylaxis : PPI  Disposition Plan  :    Status is: Inpatient  Remains inpatient appropriate because:IV treatments appropriate due to intensity of illness or inability to take PO   Dispo: The patient is from: Home              Anticipated d/c is to: Home              Anticipated d/c date is: > 3 days              Patient currently is not medically stable to d/c.  DVT Prophylaxis  :  Lovenox   Lab Results  Component Value Date   PLT 124 (L) 09/19/2020    Diet :  Diet Order            Diet Heart Room service appropriate? Yes; Fluid consistency: Thin  Diet effective now                  Inpatient Medications  Scheduled Meds: . albuterol  2 puff Inhalation TID  . vitamin C  500 mg Oral Daily  . enoxaparin (LOVENOX) injection  60 mg Subcutaneous Q0600  . latanoprost  1 drop Both Eyes QHS  . methylPREDNISolone (SOLU-MEDROL) injection  60 mg Intravenous Q12H  . zinc sulfate  220 mg Oral Daily   Continuous Infusions: . remdesivir 100 mg in NS 100 mL 100 mg (09/19/20 0911)   PRN  Meds:.acetaminophen, albuterol, dextromethorphan-guaiFENesin, guaiFENesin-dextromethorphan, ondansetron (ZOFRAN) IV  Antibiotics  :    Anti-infectives (From admission, onward)   Start     Dose/Rate Route Frequency Ordered Stop   09/18/20 1000  remdesivir 100 mg in sodium chloride 0.9 % 100 mL IVPB  Status:  Discontinued        100 mg 200 mL/hr over 30 Minutes Intravenous Daily 09/17/20 0010 09/17/20 0908   09/17/20 1000  remdesivir 100 mg in sodium chloride 0.9 % 100 mL IVPB  Status:  Discontinued       "Followed by" Linked  Group Details   100 mg 200 mL/hr over 30 Minutes Intravenous Daily 10/08/2020 2340 09/17/20 0009   09/17/20 0908  remdesivir 100 mg in sodium chloride 0.9 % 100 mL IVPB        100 mg 200 mL/hr over 30 Minutes Intravenous Daily 09/17/20 0908 09/21/20 0959   09/17/20 0015  remdesivir 100 mg in sodium chloride 0.9 % 100 mL IVPB        100 mg 200 mL/hr over 30 Minutes Intravenous Every 30 min 09/17/20 0010 09/17/20 0114   10/02/2020 2345  remdesivir 200 mg in sodium chloride 0.9% 250 mL IVPB  Status:  Discontinued       "Followed by" Linked Group Details   200 mg 580 mL/hr over 30 Minutes Intravenous Once 10/10/2020 2340 09/17/20 0009       Time Spent in minutes  30   Lala Lund M.D on 09/19/2020 at 9:53 AM  To page go to [email or web address]   [facility] -  Office  [phone]   See all Orders from today for further details    Objective:   Vitals:   09/18/20 2247 09/19/20 0006 09/19/20 0028 09/19/20 0422  BP: (!) 141/74  129/73 116/66  Pulse: 64  62 66  Resp: 20 18 20  (!) 24  Temp: 97.9 F (36.6 C)  (!) 97.4 F (36.3 C) 97.8 F (36.6 C)  TempSrc: Oral  Axillary Oral  SpO2: 93% 90% 93% 90%  Weight:      Height:        Wt Readings from Last 3 Encounters:  09/18/20 119.5 kg  05/22/17 124.3 kg  04/06/17 126.6 kg     Intake/Output Summary (Last 24 hours) at 09/19/2020 0953 Last data filed at 09/19/2020 4403 Gross per 24 hour  Intake 700 ml   Output 425 ml  Net 275 ml     Physical Exam  Awake Alert, No new F.N deficits, Normal affect [place].AT,PERRAL Supple Neck,No JVD, No cervical lymphadenopathy appriciated.  Symmetrical Chest wall movement, Good air movement bilaterally, few rales RRR,No Gallops, Rubs or new Murmurs, No Parasternal Heave +ve B.Sounds, Abd Soft, No tenderness, No organomegaly appriciated, No rebound - guarding or rigidity. No Cyanosis, Clubbing or edema, No new Rash or bruise     Data Review:    CBC Recent Labs  Lab 10/06/2020 2042 09/17/20 0510 09/19/20 0114  WBC 5.5 5.5 7.4  HGB 13.9 13.7 14.2  HCT 40.4 39.9 41.4  PLT 122* 115* 124*  MCV 94.8 95.5 94.5  MCH 32.6 32.8 32.4  MCHC 34.4 34.3 34.3  RDW 13.3 13.2 13.2  LYMPHSABS 0.4* 0.4* 0.4*  MONOABS 0.2 0.1 0.3  EOSABS 0.0 0.0 0.0  BASOSABS 0.0 0.0 0.0    Recent Labs  Lab 10/04/2020 2042 09/28/2020 2320 09/17/20 0510 09/18/20 0732 09/19/20 0114  NA 132*  --  133*  --  136  K 3.4*  --  3.8  --  3.8  CL 99  --  98  --  101  CO2 22  --  26  --  26  GLUCOSE 130*  --  135*  --  134*  BUN 21  --  19  --  21  CREATININE 0.98  --  0.90  --  0.81  CALCIUM 8.3*  --  8.1*  --  8.3*  AST 49*  --  50*  --  62*  ALT 34  --  32  --  32  ALKPHOS 51  --  50  --  48  BILITOT 0.8  --  0.6  --  0.8  ALBUMIN 3.7  --  3.3*  --  2.8*  MG  --   --  2.0  --  2.2  CRP  --   --  5.8*  --  4.5*  DDIMER 1.06*  --  0.71*  --  0.84*  PROCALCITON  --   --  <0.10  --  <0.10  LATICACIDVEN 1.3 1.5  --   --   --   INR 1.1  --   --   --   --   TSH  --   --  0.791  --   --   BNP  --   --   --  103.1* 236.6*    ------------------------------------------------------------------------------------------------------------------ No results for input(s): CHOL, HDL, LDLCALC, TRIG, CHOLHDL, LDLDIRECT in the last 72 hours.  No results found for:  HGBA1C ------------------------------------------------------------------------------------------------------------------ Recent Labs    09/17/20 0510  TSH 0.791    Cardiac Enzymes No results for input(s): CKMB, TROPONINI, MYOGLOBIN in the last 168 hours.  Invalid input(s): CK ------------------------------------------------------------------------------------------------------------------    Component Value Date/Time   BNP 236.6 (H) 09/19/2020 0114    Micro Results Recent Results (from the past 240 hour(s))  Resp Panel by RT-PCR (Flu A&B, Covid) Nasopharyngeal Swab     Status: Abnormal   Collection Time: 09/23/2020  8:28 PM   Specimen: Nasopharyngeal Swab; Nasopharyngeal(NP) swabs in vial transport medium  Result Value Ref Range Status   SARS Coronavirus 2 by RT PCR POSITIVE (A) NEGATIVE Final    Comment: RESULT CALLED TO, READ BACK BY AND VERIFIED WITH: ADKINS,L AT 2116 ON [id number] BY CHERESNOWSKY,T (NOTE) SARS-CoV-2 target nucleic acids are DETECTED.  The SARS-CoV-2 RNA is generally detectable in upper respiratory specimens during the acute phase of infection. Positive results are indicative of the presence of the identified virus, but do not rule out bacterial infection or co-infection with other pathogens not detected by the test. Clinical correlation with patient history and other diagnostic information is necessary to determine patient infection status. The expected result is Negative.  Fact Sheet for Patients: EntrepreneurPulse.com.au  Fact Sheet for Healthcare Providers: IncredibleEmployment.be  This test is not yet approved or cleared by the [place] FDA and  has been authorized for detection and/or diagnosis of SARS-CoV-2 by FDA under an Emergency Use Authorization (EUA).  This EUA will remain in effect (meaning this  test can be used) for the duration of  the COVID-19 declaration under Section 564(b)(1) of the Act,  21 U.S.C. section 360bbb-3(b)(1), unless the authorization is terminated or revoked sooner.     Influenza A by PCR NEGATIVE NEGATIVE Final   Influenza B by PCR NEGATIVE NEGATIVE Final    Comment: (NOTE) The Xpert Xpress SARS-CoV-2/FLU/RSV plus assay is intended as an aid in the diagnosis of influenza from Nasopharyngeal swab specimens and should not be used as a sole basis for treatment. Nasal washings and aspirates are unacceptable for Xpert Xpress SARS-CoV-2/FLU/RSV testing.  Fact Sheet for Patients: EntrepreneurPulse.com.au  Fact Sheet for Healthcare Providers: IncredibleEmployment.be  This test is not yet approved or cleared by the [place] FDA and has been authorized for detection and/or diagnosis of SARS-CoV-2 by FDA under an Emergency Use Authorization (EUA). This EUA will remain in effect (meaning this test can be used) for the duration of the COVID-19 declaration under Section 564(b)(1) of the Act, 21 U.S.C. section 360bbb-3(b)(1), unless the authorization is terminated or revoked.  Performed at [facility]  [address], [place] [place], [place] [address]   Culture, blood (Routine x 2)     Status: None (Preliminary result)   Collection Time: 09/28/2020  8:43 PM   Specimen: BLOOD  Result Value Ref Range Status   Specimen Description   Final    BLOOD BLOOD RIGHT FOREARM Performed at [facility], [address] [place], [place] [address]    Special Requests   Final    BOTTLES DRAWN AEROBIC AND ANAEROBIC Blood Culture adequate volume Performed at [facility], [address] [place], [place] [address]    Culture   Final    NO GROWTH 3 DAYS Performed at [facility], [place] [address] [place], [place] [address]    Report Status PENDING  Incomplete  Culture, blood (Routine x 2)     Status: None (Preliminary result)   Collection Time: 10/08/2020  9:09 PM   Specimen: BLOOD  Result Value Ref  Range Status   Specimen Description   Final    BLOOD BLOOD LEFT HAND Performed at [facility], [address] [place], [place] [address]    Special Requests   Final    BOTTLES DRAWN AEROBIC AND ANAEROBIC Blood Culture adequate volume Performed at [facility], [address] [place], [place] [address]    Culture   Final    NO GROWTH 3 DAYS Performed at [facility], [place] [address] [place],  [address]    Report Status PENDING  Incomplete  MRSA PCR Screening     Status: None   Collection Time: 09/18/20  7:51 AM   Specimen: Nasopharyngeal  Result Value Ref Range Status   MRSA by PCR NEGATIVE NEGATIVE Final    Comment:        The GeneXpert MRSA Assay (FDA approved for NASAL specimens only), is one component of a comprehensive MRSA colonization surveillance program. It is not intended to diagnose MRSA infection nor to guide or monitor treatment for MRSA infections. Performed at [facility], [place] [address], [place],  [address]     Radiology Reports DG Chest [place] 1 View  Result Date: 09/19/2020 CLINICAL DATA:  Shortness of breath.  COVID positive. EXAM: PORTABLE CHEST 1 VIEW COMPARISON:  September 16, 2020. FINDINGS: Increased conspicuity of hazy and interstitial opacities involving the left greater than right lung bases. No visible pleural effusions or pneumothorax. Biapical pleuroparenchymal scarring. Mild cardiac enlargement. IMPRESSION: Increased conspicuity of hazy and interstitial opacities involving the left greater than right lung bases, concerning for multifocal pneumonia in the setting of reported COVID positivity. Electronically Signed   By: Margaretha Sheffield MD   On: 09/19/2020 08:56   DG Chest Portable 1 View  Result Date: 09/13/2020 CLINICAL DATA:  Fever EXAM: PORTABLE CHEST 1 VIEW COMPARISON:  None. FINDINGS: There are hazy bilateral airspace opacities. The heart size is mildly enlarged. There is no pneumothorax or  large pleural effusion. No acute osseous abnormality. IMPRESSION: Hazy bilateral airspace opacities concerning for multifocal pneumonia (viral or bacterial). Electronically Signed   By: Constance Holster M.D.   On: 09/20/2020 21:13

## 2020-09-20 LAB — CBC WITH DIFFERENTIAL/PLATELET
Abs Immature Granulocytes: 0.07 10*3/uL (ref 0.00–0.07)
Basophils Absolute: 0 10*3/uL (ref 0.0–0.1)
Basophils Relative: 0 %
Eosinophils Absolute: 0 10*3/uL (ref 0.0–0.5)
Eosinophils Relative: 0 %
HCT: 39.4 % (ref 39.0–52.0)
Hemoglobin: 13.7 g/dL (ref 13.0–17.0)
Immature Granulocytes: 1 %
Lymphocytes Relative: 3 %
Lymphs Abs: 0.3 10*3/uL — ABNORMAL LOW (ref 0.7–4.0)
MCH: 32.8 pg (ref 26.0–34.0)
MCHC: 34.8 g/dL (ref 30.0–36.0)
MCV: 94.3 fL (ref 80.0–100.0)
Monocytes Absolute: 0.4 10*3/uL (ref 0.1–1.0)
Monocytes Relative: 3 %
Neutro Abs: 10.1 10*3/uL — ABNORMAL HIGH (ref 1.7–7.7)
Neutrophils Relative %: 93 %
Platelets: 151 10*3/uL (ref 150–400)
RBC: 4.18 MIL/uL — ABNORMAL LOW (ref 4.22–5.81)
RDW: 13.2 % (ref 11.5–15.5)
WBC: 10.9 10*3/uL — ABNORMAL HIGH (ref 4.0–10.5)
nRBC: 0 % (ref 0.0–0.2)

## 2020-09-20 LAB — COMPREHENSIVE METABOLIC PANEL
ALT: 127 U/L — ABNORMAL HIGH (ref 0–44)
AST: 175 U/L — ABNORMAL HIGH (ref 15–41)
Albumin: 2.9 g/dL — ABNORMAL LOW (ref 3.5–5.0)
Alkaline Phosphatase: 61 U/L (ref 38–126)
Anion gap: 10 (ref 5–15)
BUN: 28 mg/dL — ABNORMAL HIGH (ref 8–23)
CO2: 28 mmol/L (ref 22–32)
Calcium: 8.2 mg/dL — ABNORMAL LOW (ref 8.9–10.3)
Chloride: 99 mmol/L (ref 98–111)
Creatinine, Ser: 0.97 mg/dL (ref 0.61–1.24)
GFR, Estimated: >60 mL/min (ref >60)
Glucose, Bld: 141 mg/dL — ABNORMAL HIGH (ref 70–99)
Potassium: 3.8 mmol/L (ref 3.5–5.1)
Sodium: 137 mmol/L (ref 135–145)
Total Bilirubin: 0.8 mg/dL (ref 0.3–1.2)
Total Protein: 6.5 g/dL (ref 6.5–8.1)

## 2020-09-20 LAB — D-DIMER, QUANTITATIVE: D-Dimer, Quant: 1.05 ug/mL-FEU — ABNORMAL HIGH (ref 0.00–0.50)

## 2020-09-20 LAB — PROCALCITONIN: Procalcitonin: <0.1 ng/mL

## 2020-09-20 LAB — MAGNESIUM: Magnesium: 2.4 mg/dL (ref 1.7–2.4)

## 2020-09-20 LAB — C-REACTIVE PROTEIN: CRP: 1.5 mg/dL — ABNORMAL HIGH (ref <1.0)

## 2020-09-20 LAB — BRAIN NATRIURETIC PEPTIDE: B Natriuretic Peptide: 67.8 pg/mL (ref 0.0–100.0)

## 2020-09-20 MED ORDER — ENOXAPARIN SODIUM 60 MG/0.6ML ~~LOC~~ SOLN
60.0000 mg | Freq: Two times a day (BID) | SUBCUTANEOUS | Status: DC
  Administered 2020-09-20 – 2020-09-23 (×6): 60 mg via SUBCUTANEOUS
  Filled 2020-09-20 (×6): qty 0.6

## 2020-09-20 NOTE — Progress Notes (Signed)
PROGRESS NOTE                                                                                                                                                                                                             Patient Demographics:    Javier Peterson, is a 74 y.o. male, DOB - 05/29/1946, OTL:[id number]  Outpatient Primary MD for the patient is Patient, No Pcp Per    LOS - 2  Admit date - 09/18/2020    Chief Complaint  Patient presents with  . Fever       Brief Narrative (HPI from H&P)  - Javier Peterson is a 74 y.o. male with medical history significant of Glaucoma.  Pt presents to ED at [facility] with c/o fever, SOB.  Feeling unwell since Thursday, diagnosed with prostatitis on Friday and started cipro on Friday.  Dysuria has since resolved but continues to have fever up to 102.6, cough for past 2-3 days, N/V, syncope episode at home.  No leg swelling/pain, no abd pain, not vaccinated to COVID, was admitted with Acute Hypoxic Resp. failure due to Covid Pneumonia.   Subjective:   Patient in bed, appears comfortable, denies any headache, no fever, no chest pain or pressure, mild shortness of breath , no abdominal pain. No focal weakness.   Assessment  & Plan :     1. Acute Hypoxic Resp. Failure due to Acute Covid 19 Viral Pneumonitis during the ongoing 2020 Covid 19 Pandemic - he is unfortunately not vaccinated and he has incurred severe parenchymal lung injury due to COVID-19 pneumonia.  He has been placed on IV steroids, Remdesivir and Actemra x 1 on 09/18/20, 2nd dose 09/19/20.  Unfortunately he seems to have sustained severe parenchymal lung injury and still quite hypoxic, may need escalation to heated high flow, will continue to monitor closely.  Encouraged the patient to sit up in chair in the daytime use I-S and flutter valve for pulmonary toiletry and then prone in bed when at night.  Will advance activity and  titrate down oxygen as possible.      SpO2: 92 % O2 Flow Rate (L/min): 15 L/min  Recent Labs  Lab 09/30/2020 2028 09/13/2020 2042 10/06/2020 2320 09/17/20 0510 09/18/20 0732 09/19/20 0114 09/20/20 0256  WBC  --  5.5  --  5.5  --  7.4 10.9*  HGB  --  13.9  --  13.7  --  14.2 13.7  HCT  --  40.4  --  39.9  --  41.4 39.4  PLT  --  122*  --  115*  --  124* 151  CRP  --   --   --  5.8*  --  4.5* 1.5*  BNP  --   --   --   --  103.1* 236.6* 67.8  DDIMER  --  1.06*  --  0.71*  --  0.84* 1.05*  PROCALCITON  --   --   --  <0.10  --  <0.10 <0.10  AST  --  49*  --  50*  --  62* 175*  ALT  --  34  --  32  --  32 127*  ALKPHOS  --  51  --  50  --  48 61  BILITOT  --  0.8  --  0.6  --  0.8 0.8  ALBUMIN  --  3.7  --  3.3*  --  2.8* 2.9*  INR  --  1.1  --   --   --   --   --   LATICACIDVEN  --  1.3 1.5  --   --   --   --   SARSCOV2NAA POSITIVE*  --   --   --   --   --   --          Condition - Extremely Guarded  Family Communication  :  Wife Judeen Hammans [phone] on 09/18/20, 09/20/20  Code Status :  Full  Consults  :  None  Procedures  :  None  PUD Prophylaxis : PPI  Disposition Plan  :    Status is: Inpatient  Remains inpatient appropriate because:IV treatments appropriate due to intensity of illness or inability to take PO  Dispo: The patient is from: Home              Anticipated d/c is to: Home              Anticipated d/c date is: > 3 days              Patient currently is not medically stable to d/c.  DVT Prophylaxis  :  Lovenox   Lab Results  Component Value Date   PLT 151 09/20/2020    Diet :  Diet Order            Diet Heart Room service appropriate? Yes; Fluid consistency: Thin  Diet effective now                  Inpatient Medications  Scheduled Meds: . albuterol  2 puff Inhalation TID  . vitamin C  500 mg Oral Daily  . enoxaparin (LOVENOX) injection  60 mg Subcutaneous Q12H  . latanoprost  1 drop Both Eyes QHS  . methylPREDNISolone (SOLU-MEDROL)  injection  60 mg Intravenous Q12H  . zinc sulfate  220 mg Oral Daily   Continuous Infusions: . remdesivir 100 mg in NS 100 mL 100 mg (09/20/20 0929)   PRN Meds:.acetaminophen, albuterol, dextromethorphan-guaiFENesin, guaiFENesin-dextromethorphan, ondansetron (ZOFRAN) IV  Antibiotics  :    Anti-infectives (From admission, onward)   Start     Dose/Rate Route Frequency Ordered Stop   09/18/20 1000  remdesivir 100 mg in sodium chloride 0.9 % 100 mL IVPB  Status:  Discontinued        100 mg 200 mL/hr over 30 Minutes Intravenous  Daily 09/17/20 0010 09/17/20 0908   09/17/20 1000  remdesivir 100 mg in sodium chloride 0.9 % 100 mL IVPB  Status:  Discontinued       "Followed by" Linked Group Details   100 mg 200 mL/hr over 30 Minutes Intravenous Daily 09/28/2020 2340 09/17/20 0009   09/17/20 0908  remdesivir 100 mg in sodium chloride 0.9 % 100 mL IVPB        100 mg 200 mL/hr over 30 Minutes Intravenous Daily 09/17/20 0908 09/21/20 0959   09/17/20 0015  remdesivir 100 mg in sodium chloride 0.9 % 100 mL IVPB        100 mg 200 mL/hr over 30 Minutes Intravenous Every 30 min 09/17/20 0010 09/17/20 0114   09/18/2020 2345  remdesivir 200 mg in sodium chloride 0.9% 250 mL IVPB  Status:  Discontinued       "Followed by" Linked Group Details   200 mg 580 mL/hr over 30 Minutes Intravenous Once 09/21/2020 2340 09/17/20 0009       Time Spent in minutes  30   Lala Lund M.D on 09/20/2020 at 9:56 AM  To page go to [email or web address]   [facility] -  Office  [phone]   See all Orders from today for further details    Objective:   Vitals:   09/19/20 1430 09/19/20 1433 09/19/20 2122 09/20/20 0505  BP:  120/61 116/72 (!) 138/91  Pulse: 69  72 63  Resp: 20  20 16   Temp: (!) 97.1 F (36.2 C)  98.5 F (36.9 C) 97.7 F (36.5 C)  TempSrc: Oral  Oral Axillary  SpO2: 90%  92% 92%  Weight:      Height:        Wt Readings from Last 3 Encounters:  09/18/20 119.5 kg  05/22/17 124.3 kg   04/06/17 126.6 kg     Intake/Output Summary (Last 24 hours) at 09/20/2020 0956 Last data filed at 09/20/2020 0611 Gross per 24 hour  Intake 800 ml  Output 950 ml  Net -150 ml     Physical Exam  Awake Alert, No new F.N deficits, Normal affect [place].AT,PERRAL Supple Neck,No JVD, No cervical lymphadenopathy appriciated.  Symmetrical Chest wall movement, Good air movement bilaterally, CTAB RRR,No Gallops, Rubs or new Murmurs, No Parasternal Heave +ve B.Sounds, Abd Soft, No tenderness, No organomegaly appriciated, No rebound - guarding or rigidity. No Cyanosis, Clubbing or edema, No new Rash or bruise   Data Review:    CBC Recent Labs  Lab 09/11/2020 2042 09/17/20 0510 09/19/20 0114 09/20/20 0256  WBC 5.5 5.5 7.4 10.9*  HGB 13.9 13.7 14.2 13.7  HCT 40.4 39.9 41.4 39.4  PLT 122* 115* 124* 151  MCV 94.8 95.5 94.5 94.3  MCH 32.6 32.8 32.4 32.8  MCHC 34.4 34.3 34.3 34.8  RDW 13.3 13.2 13.2 13.2  LYMPHSABS 0.4* 0.4* 0.4* 0.3*  MONOABS 0.2 0.1 0.3 0.4  EOSABS 0.0 0.0 0.0 0.0  BASOSABS 0.0 0.0 0.0 0.0    Recent Labs  Lab 09/23/2020 2042 09/10/2020 2320 09/17/20 0510 09/18/20 0732 09/19/20 0114 09/20/20 0256  NA 132*  --  133*  --  136 137  K 3.4*  --  3.8  --  3.8 3.8  CL 99  --  98  --  101 99  CO2 22  --  26  --  26 28  GLUCOSE 130*  --  135*  --  134* 141*  BUN 21  --  19  --  21 28*  CREATININE 0.98  --  0.90  --  0.81 0.97  CALCIUM 8.3*  --  8.1*  --  8.3* 8.2*  AST 49*  --  50*  --  62* 175*  ALT 34  --  32  --  32 127*  ALKPHOS 51  --  50  --  48 61  BILITOT 0.8  --  0.6  --  0.8 0.8  ALBUMIN 3.7  --  3.3*  --  2.8* 2.9*  MG  --   --  2.0  --  2.2 2.4  CRP  --   --  5.8*  --  4.5* 1.5*  DDIMER 1.06*  --  0.71*  --  0.84* 1.05*  PROCALCITON  --   --  <0.10  --  <0.10 <0.10  LATICACIDVEN 1.3 1.5  --   --   --   --   INR 1.1  --   --   --   --   --   TSH  --   --  0.791  --   --   --   BNP  --   --   --  103.1* 236.6* 67.8     ------------------------------------------------------------------------------------------------------------------ No results for input(s): CHOL, HDL, LDLCALC, TRIG, CHOLHDL, LDLDIRECT in the last 72 hours.  No results found for: HGBA1C ------------------------------------------------------------------------------------------------------------------ No results for input(s): TSH, T4TOTAL, T3FREE, THYROIDAB in the last 72 hours.  Invalid input(s): FREET3  Cardiac Enzymes No results for input(s): CKMB, TROPONINI, MYOGLOBIN in the last 168 hours.  Invalid input(s): CK ------------------------------------------------------------------------------------------------------------------    Component Value Date/Time   BNP 67.8 09/20/2020 0256    Micro Results Recent Results (from the past 240 hour(s))  Resp Panel by RT-PCR (Flu A&B, Covid) Nasopharyngeal Swab     Status: Abnormal   Collection Time: 10/08/2020  8:28 PM   Specimen: Nasopharyngeal Swab; Nasopharyngeal(NP) swabs in vial transport medium  Result Value Ref Range Status   SARS Coronavirus 2 by RT PCR POSITIVE (A) NEGATIVE Final    Comment: RESULT CALLED TO, READ BACK BY AND VERIFIED WITH: ADKINS,L AT 2116 ON [id number] BY CHERESNOWSKY,T (NOTE) SARS-CoV-2 target nucleic acids are DETECTED.  The SARS-CoV-2 RNA is generally detectable in upper respiratory specimens during the acute phase of infection. Positive results are indicative of the presence of the identified virus, but do not rule out bacterial infection or co-infection with other pathogens not detected by the test. Clinical correlation with patient history and other diagnostic information is necessary to determine patient infection status. The expected result is Negative.  Fact Sheet for Patients: EntrepreneurPulse.com.au  Fact Sheet for Healthcare Providers: IncredibleEmployment.be  This test is not yet approved or cleared by the  [place] FDA and  has been authorized for detection and/or diagnosis of SARS-CoV-2 by FDA under an Emergency Use Authorization (EUA).  This EUA will remain in effect (meaning this  test can be used) for the duration of  the COVID-19 declaration under Section 564(b)(1) of the Act, 21 U.S.C. section 360bbb-3(b)(1), unless the authorization is terminated or revoked sooner.     Influenza A by PCR NEGATIVE NEGATIVE Final   Influenza B by PCR NEGATIVE NEGATIVE Final    Comment: (NOTE) The Xpert Xpress SARS-CoV-2/FLU/RSV plus assay is intended as an aid in the diagnosis of influenza from Nasopharyngeal swab specimens and should not be used as a sole basis for treatment. Nasal washings and aspirates are unacceptable for Xpert Xpress SARS-CoV-2/FLU/RSV testing.  Fact Sheet for Patients: EntrepreneurPulse.com.au  Fact Sheet  for Healthcare Providers: IncredibleEmployment.be  This test is not yet approved or cleared by the [place] and has been authorized for detection and/or diagnosis of SARS-CoV-2 by FDA under an Emergency Use Authorization (EUA). This EUA will remain in effect (meaning this test can be used) for the duration of the COVID-19 declaration under Section 564(b)(1) of the Act, 21 U.S.C. section 360bbb-3(b)(1), unless the authorization is terminated or revoked.  Performed at [facility], [address] [place], [place] [address]   Culture, blood (Routine x 2)     Status: None (Preliminary result)   Collection Time: 09/14/2020  8:43 PM   Specimen: BLOOD  Result Value Ref Range Status   Specimen Description   Final    BLOOD BLOOD RIGHT FOREARM Performed at [facility], [address] [place], [place] [address]    Special Requests   Final    BOTTLES DRAWN AEROBIC AND ANAEROBIC Blood Culture adequate volume Performed at [facility], [address] [place], [place] [address]    Culture    Final    NO GROWTH 3 DAYS Performed at [facility], [place] [address] [place], [place] [address]    Report Status PENDING  Incomplete  Culture, blood (Routine x 2)     Status: None (Preliminary result)   Collection Time: 10/08/2020  9:09 PM   Specimen: BLOOD  Result Value Ref Range Status   Specimen Description   Final    BLOOD BLOOD LEFT HAND Performed at [facility], [address] [place], [place] [address]    Special Requests   Final    BOTTLES DRAWN AEROBIC AND ANAEROBIC Blood Culture adequate volume Performed at [facility], [address] [place], [place] [address]    Culture   Final    NO GROWTH 3 DAYS Performed at [facility], [place] [address] [place], [place] [address]    Report Status PENDING  Incomplete  MRSA PCR Screening     Status: None   Collection Time: 09/18/20  7:51 AM   Specimen: Nasopharyngeal  Result Value Ref Range Status   MRSA by PCR NEGATIVE NEGATIVE Final    Comment:        The GeneXpert MRSA Assay (FDA approved for NASAL specimens only), is one component of a comprehensive MRSA colonization surveillance program. It is not intended to diagnose MRSA infection nor to guide or monitor treatment for MRSA infections. Performed at [facility], [place] [address], [place],  [address]     Radiology Reports DG Chest [place] 1 View  Result Date: 09/19/2020 CLINICAL DATA:  Shortness of breath.  COVID positive. EXAM: PORTABLE CHEST 1 VIEW COMPARISON:  September 16, 2020. FINDINGS: Increased conspicuity of hazy and interstitial opacities involving the left greater than right lung bases. No visible pleural effusions or pneumothorax. Biapical pleuroparenchymal scarring. Mild cardiac enlargement. IMPRESSION: Increased conspicuity of hazy and interstitial opacities involving the left greater than right lung bases, concerning for multifocal pneumonia in the setting of reported COVID positivity. Electronically Signed   By:  Margaretha Sheffield MD   On: 09/19/2020 08:56   DG Chest Portable 1 View  Result Date: 09/15/2020 CLINICAL DATA:  Fever EXAM: PORTABLE CHEST 1 VIEW COMPARISON:  None. FINDINGS: There are hazy bilateral airspace opacities. The heart size is mildly enlarged. There is no pneumothorax or large pleural effusion. No acute osseous abnormality. IMPRESSION: Hazy bilateral airspace  opacities concerning for multifocal pneumonia (viral or bacterial). Electronically Signed   By: Constance Holster M.D.   On: 09/24/2020 21:13

## 2020-09-21 ENCOUNTER — Inpatient Hospital Stay (HOSPITAL_COMMUNITY): Payer: Medicare HMO

## 2020-09-21 LAB — CBC WITH DIFFERENTIAL/PLATELET
Abs Immature Granulocytes: 0.08 10*3/uL — ABNORMAL HIGH (ref 0.00–0.07)
Basophils Absolute: 0 10*3/uL (ref 0.0–0.1)
Basophils Relative: 0 %
Eosinophils Absolute: 0 10*3/uL (ref 0.0–0.5)
Eosinophils Relative: 0 %
HCT: 41 % (ref 39.0–52.0)
Hemoglobin: 13.8 g/dL (ref 13.0–17.0)
Immature Granulocytes: 1 %
Lymphocytes Relative: 3 %
Lymphs Abs: 0.4 10*3/uL — ABNORMAL LOW (ref 0.7–4.0)
MCH: 32 pg (ref 26.0–34.0)
MCHC: 33.7 g/dL (ref 30.0–36.0)
MCV: 95.1 fL (ref 80.0–100.0)
Monocytes Absolute: 0.5 10*3/uL (ref 0.1–1.0)
Monocytes Relative: 4 %
Neutro Abs: 10.9 10*3/uL — ABNORMAL HIGH (ref 1.7–7.7)
Neutrophils Relative %: 92 %
Platelets: 144 10*3/uL — ABNORMAL LOW (ref 150–400)
RBC: 4.31 MIL/uL (ref 4.22–5.81)
RDW: 13.1 % (ref 11.5–15.5)
WBC: 11.9 10*3/uL — ABNORMAL HIGH (ref 4.0–10.5)
nRBC: 0 % (ref 0.0–0.2)

## 2020-09-21 LAB — BRAIN NATRIURETIC PEPTIDE: B Natriuretic Peptide: 76.4 pg/mL (ref 0.0–100.0)

## 2020-09-21 LAB — CULTURE, BLOOD (ROUTINE X 2)
Culture: NO GROWTH
Culture: NO GROWTH
Special Requests: ADEQUATE
Special Requests: ADEQUATE

## 2020-09-21 LAB — COMPREHENSIVE METABOLIC PANEL
ALT: 285 U/L — ABNORMAL HIGH (ref 0–44)
AST: 247 U/L — ABNORMAL HIGH (ref 15–41)
Albumin: 2.9 g/dL — ABNORMAL LOW (ref 3.5–5.0)
Alkaline Phosphatase: 77 U/L (ref 38–126)
Anion gap: 11 (ref 5–15)
BUN: 26 mg/dL — ABNORMAL HIGH (ref 8–23)
CO2: 27 mmol/L (ref 22–32)
Calcium: 8.2 mg/dL — ABNORMAL LOW (ref 8.9–10.3)
Chloride: 102 mmol/L (ref 98–111)
Creatinine, Ser: 0.81 mg/dL (ref 0.61–1.24)
GFR, Estimated: >60 mL/min (ref >60)
Glucose, Bld: 139 mg/dL — ABNORMAL HIGH (ref 70–99)
Potassium: 3.8 mmol/L (ref 3.5–5.1)
Sodium: 140 mmol/L (ref 135–145)
Total Bilirubin: 0.9 mg/dL (ref 0.3–1.2)
Total Protein: 6.5 g/dL (ref 6.5–8.1)

## 2020-09-21 LAB — D-DIMER, QUANTITATIVE: D-Dimer, Quant: 1.41 ug/mL-FEU — ABNORMAL HIGH (ref 0.00–0.50)

## 2020-09-21 LAB — MAGNESIUM: Magnesium: 2.4 mg/dL (ref 1.7–2.4)

## 2020-09-21 LAB — PROTIME-INR
INR: 1.2 (ref 0.8–1.2)
Prothrombin Time: 14.6 seconds (ref 11.4–15.2)

## 2020-09-21 LAB — PROCALCITONIN: Procalcitonin: 0.1 ng/mL

## 2020-09-21 LAB — C-REACTIVE PROTEIN: CRP: 0.8 mg/dL (ref <1.0)

## 2020-09-21 MED ORDER — SODIUM CHLORIDE 0.9 % IV SOLN
100.0000 mg | Freq: Once | INTRAVENOUS | Status: AC
  Administered 2020-09-21: 100 mg via INTRAVENOUS
  Filled 2020-09-21: qty 20

## 2020-09-21 NOTE — Progress Notes (Signed)
Oxygen saturation 86-88%, encouraged patient to raise head of bed or prone due to sleep apnea. NRB in place at 15 L.  PRN inhaler given.  Oxygen saturation 92%, patient on right side.

## 2020-09-21 NOTE — Progress Notes (Signed)
PROGRESS NOTE                                                                                                                                                                                                             Patient Demographics:    Javier Peterson, is a 74 y.o. male, DOB - [birth year], GUR:[id number]  Outpatient Primary MD for the patient is Patient, No Pcp Per    LOS - 3  Admit date - 10/01/2020    Chief Complaint  Patient presents with  . Fever       Brief Narrative (HPI from H&P)  - Javier Peterson is a 74 y.o. male with medical history significant of Glaucoma.  Pt presents to ED at [facility] with c/o fever, SOB.  Feeling unwell since Thursday, diagnosed with prostatitis on Friday and started cipro on Friday.  Dysuria has since resolved but continues to have fever up to 102.6, cough for past 2-3 days, N/V, syncope episode at home.  No leg swelling/pain, no abd pain, not vaccinated to COVID, was admitted with Acute Hypoxic Resp. failure due to Covid Pneumonia.   Subjective:   Patient sitting in chair, denies any headache chest or abdominal pain.  Mild shortness of breath at rest.   Assessment  & Plan :     1. Acute Hypoxic Resp. Failure due to Acute Covid 19 Viral Pneumonitis during the ongoing 2020 Covid 19 Pandemic - he is unfortunately not vaccinated and he has incurred severe parenchymal lung injury due to COVID-19 pneumonia and he delayed seeking medical attention for a few days and remained hypoxic with lung injury at home.    He has been placed on IV steroids, Remdesivir and Actemra x 1 on 09/18/20, 2nd dose 09/19/20.  Despite aggressive treatment it seems he has already sustained severe lung injury and continues to be tenuous on 35 L of heated high flow plus nonrebreather mask intermittently.  Will require weeks of medical care, if continues to get worse may require intubation.  Encouraged the patient to  sit up in chair in the daytime use I-S and flutter valve for pulmonary toiletry and then prone in bed when at night.  Will advance activity and titrate down oxygen as possible.      SpO2: (!) 85 % O2 Flow Rate (L/min): 30 L/min FiO2 (%): 100 %  Recent Labs  Lab 09/11/2020 2028 10/06/2020 2042 10/06/2020 2320 09/17/20 0510 09/18/20 0732 09/19/20 0114 09/20/20 0256 09/21/20 0454  WBC  --  5.5  --  5.5  --  7.4 10.9* 11.9*  HGB  --  13.9  --  13.7  --  14.2 13.7 13.8  HCT  --  40.4  --  39.9  --  41.4 39.4 41.0  PLT  --  122*  --  115*  --  124* 151 144*  CRP  --   --   --  5.8*  --  4.5* 1.5* 0.8  BNP  --   --   --   --  103.1* 236.6* 67.8 76.4  DDIMER  --  1.06*  --  0.71*  --  0.84* 1.05* 1.41*  PROCALCITON  --   --   --  <0.10  --  <0.10 <0.10 0.10  AST  --  49*  --  50*  --  62* 175* 247*  ALT  --  34  --  32  --  32 127* 285*  ALKPHOS  --  51  --  50  --  48 61 77  BILITOT  --  0.8  --  0.6  --  0.8 0.8 0.9  ALBUMIN  --  3.7  --  3.3*  --  2.8* 2.9* 2.9*  INR  --  1.1  --   --   --   --   --   --   LATICACIDVEN  --  1.3 1.5  --   --   --   --   --   SARSCOV2NAA POSITIVE*  --   --   --   --   --   --   --      2.  Mild worsening transaminitis.  Likely due to COVID-19 inflammation and possibly recent use of remdesivir.  Monitor trend and INR.   Condition - Extremely Guarded  Family Communication  :  Wife Judeen Hammans [phone] on 09/18/20, 09/20/20  Code Status :  Full  Consults  :  None  Procedures  :  None  PUD Prophylaxis : PPI  Disposition Plan  :    Status is: Inpatient  Remains inpatient appropriate because:IV treatments appropriate due to intensity of illness or inability to take PO  Dispo: The patient is from: Home              Anticipated d/c is to: Home              Anticipated d/c date is: > 3 days              Patient currently is not medically stable to d/c.  DVT Prophylaxis  :  Lovenox   Lab Results  Component Value Date   PLT 144 (L) 09/21/2020     Diet :  Diet Order            Diet Heart Room service appropriate? Yes; Fluid consistency: Thin  Diet effective now                  Inpatient Medications  Scheduled Meds: . albuterol  2 puff Inhalation TID  . vitamin C  500 mg Oral Daily  . enoxaparin (LOVENOX) injection  60 mg Subcutaneous Q12H  . latanoprost  1 drop Both Eyes QHS  . methylPREDNISolone (SOLU-MEDROL) injection  60 mg Intravenous Q12H  . zinc sulfate  220 mg Oral Daily   Continuous Infusions: .  remdesivir 100 mg in NS 100 mL 100 mg (09/20/20 0929)   PRN Meds:.acetaminophen, albuterol, dextromethorphan-guaiFENesin, guaiFENesin-dextromethorphan, ondansetron (ZOFRAN) IV  Antibiotics  :    Anti-infectives (From admission, onward)   Start     Dose/Rate Route Frequency Ordered Stop   09/18/20 1000  remdesivir 100 mg in sodium chloride 0.9 % 100 mL IVPB  Status:  Discontinued        100 mg 200 mL/hr over 30 Minutes Intravenous Daily 09/17/20 0010 09/17/20 0908   09/17/20 1000  remdesivir 100 mg in sodium chloride 0.9 % 100 mL IVPB  Status:  Discontinued       "Followed by" Linked Group Details   100 mg 200 mL/hr over 30 Minutes Intravenous Daily 09/21/2020 2340 09/17/20 0009   09/17/20 0908  remdesivir 100 mg in sodium chloride 0.9 % 100 mL IVPB        100 mg 200 mL/hr over 30 Minutes Intravenous Daily 09/17/20 0908 09/21/20 0959   09/17/20 0015  remdesivir 100 mg in sodium chloride 0.9 % 100 mL IVPB        100 mg 200 mL/hr over 30 Minutes Intravenous Every 30 min 09/17/20 0010 09/17/20 0114   09/15/2020 2345  remdesivir 200 mg in sodium chloride 0.9% 250 mL IVPB  Status:  Discontinued       "Followed by" Linked Group Details   200 mg 580 mL/hr over 30 Minutes Intravenous Once 09/17/2020 2340 09/17/20 0009       Time Spent in minutes  30   Lala Lund M.D on 09/21/2020 at 9:58 AM  To page go to [email or web address]   [facility] -  Office  [phone]   See all Orders from today for further  details    Objective:   Vitals:   09/20/20 2143 09/21/20 0151 09/21/20 0520 09/21/20 0845  BP: 138/75  115/65   Pulse: 71 65 79 73  Resp: (!) 24 (!) 24 (!) 25 18  Temp: 98.5 F (36.9 C)  98.3 F (36.8 C)   TempSrc: Axillary  Axillary   SpO2: 95% 94% 90% (!) 85%  Weight:      Height:        Wt Readings from Last 3 Encounters:  09/18/20 119.5 kg  05/22/17 124.3 kg  04/06/17 126.6 kg     Intake/Output Summary (Last 24 hours) at 09/21/2020 0958 Last data filed at 09/21/2020 0910 Gross per 24 hour  Intake 480 ml  Output 375 ml  Net 105 ml     Physical Exam  Awake Alert, No new F.N deficits, Normal affect [place].AT,PERRAL Supple Neck,No JVD, No cervical lymphadenopathy appriciated.  Symmetrical Chest wall movement, Good air movement bilaterally, fine rales RRR,No Gallops, Rubs or new Murmurs, No Parasternal Heave +ve B.Sounds, Abd Soft, No tenderness, No organomegaly appriciated, No rebound - guarding or rigidity. No Cyanosis, Clubbing or edema, No new Rash or bruise    Data Review:    CBC Recent Labs  Lab 09/25/2020 2042 09/17/20 0510 09/19/20 0114 09/20/20 0256 09/21/20 0454  WBC 5.5 5.5 7.4 10.9* 11.9*  HGB 13.9 13.7 14.2 13.7 13.8  HCT 40.4 39.9 41.4 39.4 41.0  PLT 122* 115* 124* 151 144*  MCV 94.8 95.5 94.5 94.3 95.1  MCH 32.6 32.8 32.4 32.8 32.0  MCHC 34.4 34.3 34.3 34.8 33.7  RDW 13.3 13.2 13.2 13.2 13.1  LYMPHSABS 0.4* 0.4* 0.4* 0.3* 0.4*  MONOABS 0.2 0.1 0.3 0.4 0.5  EOSABS 0.0 0.0 0.0 0.0 0.0  BASOSABS 0.0 0.0 0.0  0.0 0.0    Recent Labs  Lab 10/02/2020 2042 09/29/2020 2320 09/17/20 0510 09/18/20 0732 09/19/20 0114 09/20/20 0256 09/21/20 0454  NA 132*  --  133*  --  136 137 140  K 3.4*  --  3.8  --  3.8 3.8 3.8  CL 99  --  98  --  101 99 102  CO2 22  --  26  --  26 28 27   GLUCOSE 130*  --  135*  --  134* 141* 139*  BUN 21  --  19  --  21 28* 26*  CREATININE 0.98  --  0.90  --  0.81 0.97 0.81  CALCIUM 8.3*  --  8.1*  --  8.3* 8.2* 8.2*   AST 49*  --  50*  --  62* 175* 247*  ALT 34  --  32  --  32 127* 285*  ALKPHOS 51  --  50  --  48 61 77  BILITOT 0.8  --  0.6  --  0.8 0.8 0.9  ALBUMIN 3.7  --  3.3*  --  2.8* 2.9* 2.9*  MG  --   --  2.0  --  2.2 2.4 2.4  CRP  --   --  5.8*  --  4.5* 1.5* 0.8  DDIMER 1.06*  --  0.71*  --  0.84* 1.05* 1.41*  PROCALCITON  --   --  <0.10  --  <0.10 <0.10 0.10  LATICACIDVEN 1.3 1.5  --   --   --   --   --   INR 1.1  --   --   --   --   --   --   TSH  --   --  0.791  --   --   --   --   BNP  --   --   --  103.1* 236.6* 67.8 76.4    ------------------------------------------------------------------------------------------------------------------ No results for input(s): CHOL, HDL, LDLCALC, TRIG, CHOLHDL, LDLDIRECT in the last 72 hours.  No results found for: HGBA1C ------------------------------------------------------------------------------------------------------------------ No results for input(s): TSH, T4TOTAL, T3FREE, THYROIDAB in the last 72 hours.  Invalid input(s): FREET3  Cardiac Enzymes No results for input(s): CKMB, TROPONINI, MYOGLOBIN in the last 168 hours.  Invalid input(s): CK ------------------------------------------------------------------------------------------------------------------    Component Value Date/Time   BNP 76.4 09/21/2020 0454    Micro Results Recent Results (from the past 240 hour(s))  Resp Panel by RT-PCR (Flu A&B, Covid) Nasopharyngeal Swab     Status: Abnormal   Collection Time: 10/10/2020  8:28 PM   Specimen: Nasopharyngeal Swab; Nasopharyngeal(NP) swabs in vial transport medium  Result Value Ref Range Status   SARS Coronavirus 2 by RT PCR POSITIVE (A) NEGATIVE Final    Comment: RESULT CALLED TO, READ BACK BY AND VERIFIED WITH: ADKINS,L AT 2116 ON [id number] BY CHERESNOWSKY,T (NOTE) SARS-CoV-2 target nucleic acids are DETECTED.  The SARS-CoV-2 RNA is generally detectable in upper respiratory specimens during the acute phase of infection.  Positive results are indicative of the presence of the identified virus, but do not rule out bacterial infection or co-infection with other pathogens not detected by the test. Clinical correlation with patient history and other diagnostic information is necessary to determine patient infection status. The expected result is Negative.  Fact Sheet for Patients: EntrepreneurPulse.com.au  Fact Sheet for Healthcare Providers: IncredibleEmployment.be  This test is not yet approved or cleared by the [place] FDA and  has been authorized for detection and/or diagnosis of SARS-CoV-2 by  FDA under an Emergency Use Authorization (EUA).  This EUA will remain in effect (meaning this  test can be used) for the duration of  the COVID-19 declaration under Section 564(b)(1) of the Act, 21 U.S.C. section 360bbb-3(b)(1), unless the authorization is terminated or revoked sooner.     Influenza A by PCR NEGATIVE NEGATIVE Final   Influenza B by PCR NEGATIVE NEGATIVE Final    Comment: (NOTE) The Xpert Xpress SARS-CoV-2/FLU/RSV plus assay is intended as an aid in the diagnosis of influenza from Nasopharyngeal swab specimens and should not be used as a sole basis for treatment. Nasal washings and aspirates are unacceptable for Xpert Xpress SARS-CoV-2/FLU/RSV testing.  Fact Sheet for Patients: EntrepreneurPulse.com.au  Fact Sheet for Healthcare Providers: IncredibleEmployment.be  This test is not yet approved or cleared by the [place] FDA and has been authorized for detection and/or diagnosis of SARS-CoV-2 by FDA under an Emergency Use Authorization (EUA). This EUA will remain in effect (meaning this test can be used) for the duration of the COVID-19 declaration under Section 564(b)(1) of the Act, 21 U.S.C. section 360bbb-3(b)(1), unless the authorization is terminated or revoked.  Performed at [facility],  [address] [place], [place] [address]   Culture, blood (Routine x 2)     Status: None (Preliminary result)   Collection Time: 10/01/2020  8:43 PM   Specimen: BLOOD  Result Value Ref Range Status   Specimen Description   Final    BLOOD BLOOD RIGHT FOREARM Performed at [facility], [address] [place], [place] [address]    Special Requests   Final    BOTTLES DRAWN AEROBIC AND ANAEROBIC Blood Culture adequate volume Performed at [facility], [address] [place], [place] [address]    Culture   Final    NO GROWTH 4 DAYS Performed at [facility], [place] [address] [place], [place] [address]    Report Status PENDING  Incomplete  Culture, blood (Routine x 2)     Status: None (Preliminary result)   Collection Time: 09/27/2020  9:09 PM   Specimen: BLOOD  Result Value Ref Range Status   Specimen Description   Final    BLOOD BLOOD LEFT HAND Performed at [facility], [address] [place], [place] [address]    Special Requests   Final    BOTTLES DRAWN AEROBIC AND ANAEROBIC Blood Culture adequate volume Performed at [facility], [address] [place], [place] [address]    Culture   Final    NO GROWTH 4 DAYS Performed at [facility], [place] [address], [place], [place] [address]    Report Status PENDING  Incomplete  MRSA PCR Screening     Status: None   Collection Time: 09/18/20  7:51 AM   Specimen: Nasopharyngeal  Result Value Ref Range Status   MRSA by PCR NEGATIVE NEGATIVE Final    Comment:        The GeneXpert MRSA Assay (FDA approved for NASAL specimens only), is one component of a comprehensive MRSA colonization surveillance program. It is not intended to diagnose MRSA infection nor to guide or monitor treatment for MRSA infections. Performed at [facility], [place] [address] [place], [place] [address]     Radiology Reports DG Chest [place] 1 View  Result Date: 09/21/2020 CLINICAL DATA:   Shortness of breath. EXAM: PORTABLE CHEST 1 VIEW COMPARISON:  September 19, 2020. FINDINGS:  Stable cardiomegaly. No pneumothorax or pleural effusion is noted. Mild bibasilar opacities are noted concerning for infiltrates and multifocal pneumonia. Bony thorax is unremarkable. IMPRESSION: Mild bibasilar opacities are noted concerning for multifocal pneumonia. Electronically Signed   By: Marijo Conception M.D.   On: 09/21/2020 09:53   DG Chest [place] 1 View  Result Date: 09/19/2020 CLINICAL DATA:  Shortness of breath.  COVID positive. EXAM: PORTABLE CHEST 1 VIEW COMPARISON:  September 16, 2020. FINDINGS: Increased conspicuity of hazy and interstitial opacities involving the left greater than right lung bases. No visible pleural effusions or pneumothorax. Biapical pleuroparenchymal scarring. Mild cardiac enlargement. IMPRESSION: Increased conspicuity of hazy and interstitial opacities involving the left greater than right lung bases, concerning for multifocal pneumonia in the setting of reported COVID positivity. Electronically Signed   By: Margaretha Sheffield MD   On: 09/19/2020 08:56   DG Chest Portable 1 View  Result Date: 09/29/2020 CLINICAL DATA:  Fever EXAM: PORTABLE CHEST 1 VIEW COMPARISON:  None. FINDINGS: There are hazy bilateral airspace opacities. The heart size is mildly enlarged. There is no pneumothorax or large pleural effusion. No acute osseous abnormality. IMPRESSION: Hazy bilateral airspace opacities concerning for multifocal pneumonia (viral or bacterial). Electronically Signed   By: Constance Holster M.D.   On: 09/18/2020 21:13

## 2020-09-22 ENCOUNTER — Inpatient Hospital Stay (HOSPITAL_COMMUNITY): Payer: Medicare HMO

## 2020-09-22 DIAGNOSIS — R7989 Other specified abnormal findings of blood chemistry: Secondary | ICD-10-CM

## 2020-09-22 DIAGNOSIS — U071 COVID-19: Secondary | ICD-10-CM

## 2020-09-22 LAB — CBC WITH DIFFERENTIAL/PLATELET
Abs Immature Granulocytes: 0.13 10*3/uL — ABNORMAL HIGH (ref 0.00–0.07)
Basophils Absolute: 0 10*3/uL (ref 0.0–0.1)
Basophils Relative: 0 %
Eosinophils Absolute: 0 10*3/uL (ref 0.0–0.5)
Eosinophils Relative: 0 %
HCT: 41 % (ref 39.0–52.0)
Hemoglobin: 14.5 g/dL (ref 13.0–17.0)
Immature Granulocytes: 1 %
Lymphocytes Relative: 4 %
Lymphs Abs: 0.5 10*3/uL — ABNORMAL LOW (ref 0.7–4.0)
MCH: 33.3 pg (ref 26.0–34.0)
MCHC: 35.4 g/dL (ref 30.0–36.0)
MCV: 94.3 fL (ref 80.0–100.0)
Monocytes Absolute: 0.3 10*3/uL (ref 0.1–1.0)
Monocytes Relative: 3 %
Neutro Abs: 11.9 10*3/uL — ABNORMAL HIGH (ref 1.7–7.7)
Neutrophils Relative %: 92 %
Platelets: 146 10*3/uL — ABNORMAL LOW (ref 150–400)
RBC: 4.35 MIL/uL (ref 4.22–5.81)
RDW: 13.1 % (ref 11.5–15.5)
WBC: 12.8 10*3/uL — ABNORMAL HIGH (ref 4.0–10.5)
nRBC: 0 % (ref 0.0–0.2)

## 2020-09-22 LAB — COMPREHENSIVE METABOLIC PANEL
ALT: 279 U/L — ABNORMAL HIGH (ref 0–44)
AST: 151 U/L — ABNORMAL HIGH (ref 15–41)
Albumin: 3 g/dL — ABNORMAL LOW (ref 3.5–5.0)
Alkaline Phosphatase: 90 U/L (ref 38–126)
Anion gap: 9 (ref 5–15)
BUN: 26 mg/dL — ABNORMAL HIGH (ref 8–23)
CO2: 26 mmol/L (ref 22–32)
Calcium: 8.3 mg/dL — ABNORMAL LOW (ref 8.9–10.3)
Chloride: 102 mmol/L (ref 98–111)
Creatinine, Ser: 0.83 mg/dL (ref 0.61–1.24)
GFR, Estimated: >60 mL/min (ref >60)
Glucose, Bld: 129 mg/dL — ABNORMAL HIGH (ref 70–99)
Potassium: 3.9 mmol/L (ref 3.5–5.1)
Sodium: 137 mmol/L (ref 135–145)
Total Bilirubin: 1.1 mg/dL (ref 0.3–1.2)
Total Protein: 6.6 g/dL (ref 6.5–8.1)

## 2020-09-22 LAB — PROTIME-INR
INR: 1.2 (ref 0.8–1.2)
Prothrombin Time: 14.2 seconds (ref 11.4–15.2)

## 2020-09-22 LAB — MAGNESIUM: Magnesium: 2.5 mg/dL — ABNORMAL HIGH (ref 1.7–2.4)

## 2020-09-22 LAB — D-DIMER, QUANTITATIVE: D-Dimer, Quant: 1.87 ug/mL-FEU — ABNORMAL HIGH (ref 0.00–0.50)

## 2020-09-22 LAB — PROCALCITONIN: Procalcitonin: <0.1 ng/mL

## 2020-09-22 LAB — C-REACTIVE PROTEIN: CRP: 0.6 mg/dL (ref <1.0)

## 2020-09-22 LAB — BRAIN NATRIURETIC PEPTIDE: B Natriuretic Peptide: 156.3 pg/mL — ABNORMAL HIGH (ref 0.0–100.0)

## 2020-09-22 NOTE — Progress Notes (Signed)
Physical Therapy Treatment Patient Details Name: Javier Peterson MRN: [id number] DOB: 01/29/1946 Today's Date: 09/22/2020    History of Present Illness 74 y.o. male with medical history significant of glaucoma.  Pt presents to ED 09/20/2020 at [facility] with c/o fever, SOB; diagnosed with prostatitis 12/3 and started cipro with dysuria resolved but continues to have fever up to 102.6, cough, N/V, and syncope episode at home. +COVID (12/7); not vaccinated    PT Comments    Patient instructed in seated HEP with level 1 and 2 bands provided plus handout. Patient demonstrated correct technique for all exercises with bil UEs and LEs. Sats dropped 98% to 89% with NRB removed to utilize IS x 5 breaths. Cues to slow down his reps and breath between.     Follow Up Recommendations  Home health PT;Supervision/Assistance - 24 hour (24/7 supervision due to decr cognition)     Equipment Recommendations  None recommended by PT    Recommendations for Other Services       Precautions / Restrictions Precautions Precautions: Fall    Mobility  Bed Mobility                  Transfers                 General transfer comment: pt reports he is doing sit to stand every hour or so; had just finished on my arrival  Ambulation/Gait             General Gait Details: pt reports he walks 2 ft forward/backward (now on HHFNC) and just completed   Stairs             Wheelchair Mobility    Modified Rankin (Stroke Patients Only)       Balance                                            Cognition Arousal/Alertness: Awake/alert Behavior During Therapy: WFL for tasks assessed/performed Overall Cognitive Status: Within Functional Limits for tasks assessed                                        Exercises Other Exercises Other Exercises: Assessed use of IS with good technique up to 1566ml; educated to allow float to fully float down before  initiating next breath Other Exercises: Educated in seated HEP and provided with level 1 and level 2 bands. Pt performed 5-10 reps bil of each exercise. Cues to slow his technique to improve his breathing. EXercises: LAQ, marching, elbow extension, elbow flexion, PNF D1 pattern, horizontal shoulder abdct wiht scapular retraction.    General Comments General comments (skin integrity, edema, etc.): Lunch arrived during session. 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 Time For Goal Achievement: 10/02/20 Potential to Achieve Goals: Good Progress towards PT goals:  Progressing toward goals    Frequency    Min 3X/week      PT Plan Current plan remains appropriate    Co-evaluation              AM-PAC PT "6 Clicks" Mobility   Outcome Measure  Help needed turning from your back to your side while in a flat bed without using bedrails?: None Help needed moving from lying on your back to sitting on the side of a flat bed without using bedrails?: A Little Help needed moving to and from a bed to a chair (including a wheelchair)?: A Little Help needed standing up from a chair using your arms (e.g., wheelchair or bedside chair)?: A Little Help needed to walk in hospital room?: A Little Help needed climbing 3-5 steps with a railing? : Total 6 Click Score: 17    End of Session Equipment Utilized During Treatment: Oxygen (30L 100% with NRB 15L) Activity Tolerance: Patient tolerated treatment well Patient left: in chair;with call bell/phone within reach   PT Visit Diagnosis: Difficulty in walking, not elsewhere classified (R26.2);Muscle weakness (generalized) (M62.81)      Time: 2248-2500 PT Time Calculation (min) (ACUTE ONLY): 20 min  Charges:  $Therapeutic Exercise: 8-22 mins                      Arby Barrette, PT Pager [phone]    Rexanne Mano 09/22/2020, 1:45 PM

## 2020-09-22 NOTE — Progress Notes (Signed)
Attempted to wean off NRB, pt desat to 88% and pt c/o feeling more SOB w/ NRB off.  PT placed back on NRB over HFNC. RN in room and aware.  Pt states his breathing feels improved now.  Sat 92%

## 2020-09-22 NOTE — Progress Notes (Signed)
Patient laid in prone position for 30 minutes. O2 sat 93% and above in prone position.

## 2020-09-22 NOTE — Progress Notes (Signed)
Bilateral lower extremity venous study completed.      Please see CV Proc for preliminary results.   Nariya Neumeyer, RVT  

## 2020-09-22 NOTE — Progress Notes (Signed)
PROGRESS NOTE                                                                                                                                                                                                             Patient Demographics:    Javier Peterson, is a 74 y.o. male, DOB - 10-Jul-1946, DXA:[id number]  Outpatient Primary MD for the patient is Patient, No Pcp Per    LOS - 4  Admit date - 10/10/2020    Chief Complaint  Patient presents with  . Fever       Brief Narrative (HPI from H&P)  - Javier Peterson is a 74 y.o. male with medical history significant of Glaucoma.  Pt presents to ED at [facility] with c/o fever, SOB.  Feeling unwell since Thursday, diagnosed with prostatitis on Friday and started cipro on Friday.  Dysuria has since resolved but continues to have fever up to 102.6, cough for past 2-3 days, N/V, syncope episode at home.  No leg swelling/pain, no abd pain, not vaccinated to COVID, was admitted with Acute Hypoxic Resp. failure due to Covid Pneumonia.   Subjective:   Patient in chair, appears comfortable, denies any headache, no fever, no chest pain or pressure, +ve shortness of breath , no abdominal pain. No focal weakness.    Assessment  & Plan :     1. Acute Hypoxic Resp. Failure due to Acute Covid 19 Viral Pneumonitis during the ongoing 2020 Covid 19 Pandemic - he is unfortunately not vaccinated and he has incurred severe parenchymal lung injury due to COVID-19 pneumonia and he delayed seeking medical attention for a few days and remained hypoxic with lung injury at home.    He has been placed on IV steroids, Remdesivir and Actemra x 1 on 09/18/20, 2nd dose 09/19/20.  Despite aggressive treatment it seems he has already sustained severe lung injury and continues to be tenuous on 35 L of heated high flow plus nonrebreather mask intermittently.  Will require weeks of medical care, if continues to get  worse may require intubation.  Encouraged the patient to sit up in chair in the daytime use I-S and flutter valve for pulmonary toiletry and then prone in bed when at night.  Will advance activity and titrate down oxygen as possible.      SpO2: 94 % O2 Flow  Rate (L/min): 30 L/min FiO2 (%): 100 %  Recent Labs  Lab 09/15/2020 2028 09/13/2020 2042 09/25/2020 2042 09/26/2020 2320 09/17/20 0510 09/18/20 0732 09/19/20 0114 09/20/20 0256 09/21/20 0454 09/22/20 0103 09/22/20 0745  WBC  --  5.5   < >  --  5.5  --  7.4 10.9* 11.9* 12.8*  --   HGB  --  13.9   < >  --  13.7  --  14.2 13.7 13.8 14.5  --   HCT  --  40.4   < >  --  39.9  --  41.4 39.4 41.0 41.0  --   PLT  --  122*   < >  --  115*  --  124* 151 144* 146*  --   CRP  --   --   --   --  5.8*  --  4.5* 1.5* 0.8  --  0.6  BNP  --   --   --   --   --  103.1* 236.6* 67.8 76.4 156.3*  --   DDIMER  --  1.06*   < >  --  0.71*  --  0.84* 1.05* 1.41*  --  1.87*  PROCALCITON  --   --   --   --  <0.10  --  <0.10 <0.10 0.10  --   --   AST  --  49*   < >  --  50*  --  62* 175* 247*  --  151*  ALT  --  34   < >  --  32  --  32 127* 285*  --  279*  ALKPHOS  --  51   < >  --  50  --  48 61 77  --  90  BILITOT  --  0.8   < >  --  0.6  --  0.8 0.8 0.9  --  1.1  ALBUMIN  --  3.7   < >  --  3.3*  --  2.8* 2.9* 2.9*  --  3.0*  INR  --  1.1  --   --   --   --   --   --  1.2  --  1.2  LATICACIDVEN  --  1.3  --  1.5  --   --   --   --   --   --   --   SARSCOV2NAA POSITIVE*  --   --   --   --   --   --   --   --   --   --    < > = values in this interval not displayed.     2.  Mild worsening transaminitis.  Likely due to COVID-19 inflammation and possibly recent use of remdesivir.  Monitor trend and INR.   Condition - Extremely Guarded  Family Communication  :  Wife Judeen Hammans [phone] on 09/18/20, 09/20/20  Code Status :  Full  Consults  :  None  Procedures  :  None  PUD Prophylaxis : PPI  Disposition Plan  :    Status is:  Inpatient  Remains inpatient appropriate because:IV treatments appropriate due to intensity of illness or inability to take PO  Dispo: The patient is from: Home              Anticipated d/c is to: Home              Anticipated d/c date is: > 3 days  Patient currently is not medically stable to d/c.  DVT Prophylaxis  :  Lovenox   Lab Results  Component Value Date   PLT 146 (L) 09/22/2020    Diet :  Diet Order            Diet Heart Room service appropriate? Yes; Fluid consistency: Thin  Diet effective now                  Inpatient Medications  Scheduled Meds: . albuterol  2 puff Inhalation TID  . vitamin C  500 mg Oral Daily  . enoxaparin (LOVENOX) injection  60 mg Subcutaneous Q12H  . latanoprost  1 drop Both Eyes QHS  . methylPREDNISolone (SOLU-MEDROL) injection  60 mg Intravenous Q12H  . zinc sulfate  220 mg Oral Daily   Continuous Infusions:  PRN Meds:.acetaminophen, albuterol, dextromethorphan-guaiFENesin, guaiFENesin-dextromethorphan, ondansetron (ZOFRAN) IV  Antibiotics  :    Anti-infectives (From admission, onward)   Start     Dose/Rate Route Frequency Ordered Stop   09/21/20 1530  remdesivir 100 mg in sodium chloride 0.9 % 100 mL IVPB        100 mg 200 mL/hr over 30 Minutes Intravenous Once 09/21/20 1437 09/21/20 1655   09/18/20 1000  remdesivir 100 mg in sodium chloride 0.9 % 100 mL IVPB  Status:  Discontinued        100 mg 200 mL/hr over 30 Minutes Intravenous Daily 09/17/20 0010 09/17/20 0908   09/17/20 1000  remdesivir 100 mg in sodium chloride 0.9 % 100 mL IVPB  Status:  Discontinued       "Followed by" Linked Group Details   100 mg 200 mL/hr over 30 Minutes Intravenous Daily 09/11/2020 2340 09/17/20 0009   09/17/20 0908  remdesivir 100 mg in sodium chloride 0.9 % 100 mL IVPB        100 mg 200 mL/hr over 30 Minutes Intravenous Daily 09/17/20 0908 09/21/20 0959   09/17/20 0015  remdesivir 100 mg in sodium chloride 0.9 % 100 mL IVPB         100 mg 200 mL/hr over 30 Minutes Intravenous Every 30 min 09/17/20 0010 09/17/20 0114   10/04/2020 2345  remdesivir 200 mg in sodium chloride 0.9% 250 mL IVPB  Status:  Discontinued       "Followed by" Linked Group Details   200 mg 580 mL/hr over 30 Minutes Intravenous Once 10/03/2020 2340 09/17/20 0009       Time Spent in minutes  30   Lala Lund M.D on 09/22/2020 at 9:44 AM  To page go to [email or web address]   [facility] -  Office  [phone]   See all Orders from today for further details    Objective:   Vitals:   09/22/20 0148 09/22/20 0450 09/22/20 0800 09/22/20 0933  BP:  136/74 124/75   Pulse: (!) 57 67 64 70  Resp: 19 18 20  (!) 22  Temp:  98.3 F (36.8 C) 98.3 F (36.8 C)   TempSrc:  Oral Oral   SpO2: 95% 93% 90% 94%  Weight:      Height:        Wt Readings from Last 3 Encounters:  09/18/20 119.5 kg  05/22/17 124.3 kg  04/06/17 126.6 kg     Intake/Output Summary (Last 24 hours) at 09/22/2020 0944 Last data filed at 09/22/2020 0821 Gross per 24 hour  Intake 340 ml  Output 925 ml  Net -585 ml     Physical Exam  Awake Alert, No  new F.N deficits, Normal affect [place].AT,PERRAL Supple Neck,No JVD, No cervical lymphadenopathy appriciated.  Symmetrical Chest wall movement, Good air movement bilaterally, CTAB RRR,No Gallops, Rubs or new Murmurs, No Parasternal Heave +ve B.Sounds, Abd Soft, No tenderness, No organomegaly appriciated, No rebound - guarding or rigidity. No Cyanosis, Clubbing or edema, No new Rash or bruise     Data Review:    CBC Recent Labs  Lab 09/17/20 0510 09/19/20 0114 09/20/20 0256 09/21/20 0454 09/22/20 0103  WBC 5.5 7.4 10.9* 11.9* 12.8*  HGB 13.7 14.2 13.7 13.8 14.5  HCT 39.9 41.4 39.4 41.0 41.0  PLT 115* 124* 151 144* 146*  MCV 95.5 94.5 94.3 95.1 94.3  MCH 32.8 32.4 32.8 32.0 33.3  MCHC 34.3 34.3 34.8 33.7 35.4  RDW 13.2 13.2 13.2 13.1 13.1  LYMPHSABS 0.4* 0.4* 0.3* 0.4* 0.5*  MONOABS 0.1 0.3 0.4 0.5 0.3   EOSABS 0.0 0.0 0.0 0.0 0.0  BASOSABS 0.0 0.0 0.0 0.0 0.0    Recent Labs  Lab 10/08/2020 2042 09/21/2020 2320 09/17/20 0510 09/18/20 0732 09/19/20 0114 09/20/20 0256 09/21/20 0454 09/22/20 0103 09/22/20 0745  NA 132*  --  133*  --  136 137 140  --  137  K 3.4*  --  3.8  --  3.8 3.8 3.8  --  3.9  CL 99  --  98  --  101 99 102  --  102  CO2 22  --  26  --  26 28 27   --  26  GLUCOSE 130*  --  135*  --  134* 141* 139*  --  129*  BUN 21  --  19  --  21 28* 26*  --  26*  CREATININE 0.98  --  0.90  --  0.81 0.97 0.81  --  0.83  CALCIUM 8.3*  --  8.1*  --  8.3* 8.2* 8.2*  --  8.3*  AST 49*  --  50*  --  62* 175* 247*  --  151*  ALT 34  --  32  --  32 127* 285*  --  279*  ALKPHOS 51  --  50  --  48 61 77  --  90  BILITOT 0.8  --  0.6  --  0.8 0.8 0.9  --  1.1  ALBUMIN 3.7  --  3.3*  --  2.8* 2.9* 2.9*  --  3.0*  MG  --   --  2.0  --  2.2 2.4 2.4  --  2.5*  CRP  --   --  5.8*  --  4.5* 1.5* 0.8  --  0.6  DDIMER 1.06*  --  0.71*  --  0.84* 1.05* 1.41*  --  1.87*  PROCALCITON  --   --  <0.10  --  <0.10 <0.10 0.10  --   --   LATICACIDVEN 1.3 1.5  --   --   --   --   --   --   --   INR 1.1  --   --   --   --   --  1.2  --  1.2  TSH  --   --  0.791  --   --   --   --   --   --   BNP  --   --   --  103.1* 236.6* 67.8 76.4 156.3*  --     ------------------------------------------------------------------------------------------------------------------ No results for input(s): CHOL, HDL, LDLCALC, TRIG, CHOLHDL, LDLDIRECT in the last 72  hours.  No results found for: HGBA1C ------------------------------------------------------------------------------------------------------------------ No results for input(s): TSH, T4TOTAL, T3FREE, THYROIDAB in the last 72 hours.  Invalid input(s): FREET3  Cardiac Enzymes No results for input(s): CKMB, TROPONINI, MYOGLOBIN in the last 168 hours.  Invalid input(s):  CK ------------------------------------------------------------------------------------------------------------------    Component Value Date/Time   BNP 156.3 (H) 09/22/2020 0103    Micro Results Recent Results (from the past 240 hour(s))  Resp Panel by RT-PCR (Flu A&B, Covid) Nasopharyngeal Swab     Status: Abnormal   Collection Time: 09/13/2020  8:28 PM   Specimen: Nasopharyngeal Swab; Nasopharyngeal(NP) swabs in vial transport medium  Result Value Ref Range Status   SARS Coronavirus 2 by RT PCR POSITIVE (A) NEGATIVE Final    Comment: RESULT CALLED TO, READ BACK BY AND VERIFIED WITH: ADKINS,L AT 2116 ON [id number] BY CHERESNOWSKY,T (NOTE) SARS-CoV-2 target nucleic acids are DETECTED.  The SARS-CoV-2 RNA is generally detectable in upper respiratory specimens during the acute phase of infection. Positive results are indicative of the presence of the identified virus, but do not rule out bacterial infection or co-infection with other pathogens not detected by the test. Clinical correlation with patient history and other diagnostic information is necessary to determine patient infection status. The expected result is Negative.  Fact Sheet for Patients: EntrepreneurPulse.com.au  Fact Sheet for Healthcare Providers: IncredibleEmployment.be  This test is not yet approved or cleared by the [place] FDA and  has been authorized for detection and/or diagnosis of SARS-CoV-2 by FDA under an Emergency Use Authorization (EUA).  This EUA will remain in effect (meaning this  test can be used) for the duration of  the COVID-19 declaration under Section 564(b)(1) of the Act, 21 U.S.C. section 360bbb-3(b)(1), unless the authorization is terminated or revoked sooner.     Influenza A by PCR NEGATIVE NEGATIVE Final   Influenza B by PCR NEGATIVE NEGATIVE Final    Comment: (NOTE) The Xpert Xpress SARS-CoV-2/FLU/RSV plus assay is intended as an aid in the  diagnosis of influenza from Nasopharyngeal swab specimens and should not be used as a sole basis for treatment. Nasal washings and aspirates are unacceptable for Xpert Xpress SARS-CoV-2/FLU/RSV testing.  Fact Sheet for Patients: EntrepreneurPulse.com.au  Fact Sheet for Healthcare Providers: IncredibleEmployment.be  This test is not yet approved or cleared by the [place] FDA and has been authorized for detection and/or diagnosis of SARS-CoV-2 by FDA under an Emergency Use Authorization (EUA). This EUA will remain in effect (meaning this test can be used) for the duration of the COVID-19 declaration under Section 564(b)(1) of the Act, 21 U.S.C. section 360bbb-3(b)(1), unless the authorization is terminated or revoked.  Performed at [facility], [address] [place], [place] [address]   Culture, blood (Routine x 2)     Status: None   Collection Time: 09/11/2020  8:43 PM   Specimen: BLOOD  Result Value Ref Range Status   Specimen Description   Final    BLOOD BLOOD RIGHT FOREARM Performed at [facility], [address] [place], [place] [address]    Special Requests   Final    BOTTLES DRAWN AEROBIC AND ANAEROBIC Blood Culture adequate volume Performed at [facility], [address] [place], [place] [address]    Culture   Final    NO GROWTH 5 DAYS Performed at [facility], [place] [address], [place], [place] [address]    Report Status 09/21/2020 FINAL  Final  Culture, blood (Routine  x 2)     Status: None   Collection Time: 09/18/2020  9:09 PM   Specimen: BLOOD  Result Value Ref Range Status   Specimen Description   Final    BLOOD BLOOD LEFT HAND Performed at [facility], [address] [place], [place] [address]    Special Requests   Final    BOTTLES DRAWN AEROBIC AND ANAEROBIC Blood Culture adequate volume Performed at [facility], [address] [place], [place]  [address]    Culture   Final    NO GROWTH 5 DAYS Performed at [facility], [place] [address], [place], [place] [address]    Report Status 09/21/2020 FINAL  Final  MRSA PCR Screening     Status: None   Collection Time: 09/18/20  7:51 AM   Specimen: Nasopharyngeal  Result Value Ref Range Status   MRSA by PCR NEGATIVE NEGATIVE Final    Comment:        The GeneXpert MRSA Assay (FDA approved for NASAL specimens only), is one component of a comprehensive MRSA colonization surveillance program. It is not intended to diagnose MRSA infection nor to guide or monitor treatment for MRSA infections. Performed at [facility], [place] [address] [place], [place] [address]     Radiology Reports DG Chest [place] 1 View  Result Date: 09/21/2020 CLINICAL DATA:  Shortness of breath. EXAM: PORTABLE CHEST 1 VIEW COMPARISON:  September 19, 2020. FINDINGS: Stable cardiomegaly. No pneumothorax or pleural effusion is noted. Mild bibasilar opacities are noted concerning for infiltrates and multifocal pneumonia. Bony thorax is unremarkable. IMPRESSION: Mild bibasilar opacities are noted concerning for multifocal pneumonia. Electronically Signed   By: Marijo Conception M.D.   On: 09/21/2020 09:53   DG Chest Port 1 View  Result Date: 09/19/2020 CLINICAL DATA:  Shortness of breath.  COVID positive. EXAM: PORTABLE CHEST 1 VIEW COMPARISON:  September 16, 2020. FINDINGS: Increased conspicuity of hazy and interstitial opacities involving the left greater than right lung bases. No visible pleural effusions or pneumothorax. Biapical pleuroparenchymal scarring. Mild cardiac enlargement. IMPRESSION: Increased conspicuity of hazy and interstitial opacities involving the left greater than right lung bases, concerning for multifocal pneumonia in the setting of reported COVID positivity. Electronically Signed   By: Margaretha Sheffield MD   On: 09/19/2020 08:56   DG Chest Portable 1 View  Result Date: 10/01/2020 CLINICAL DATA:   Fever EXAM: PORTABLE CHEST 1 VIEW COMPARISON:  None. FINDINGS: There are hazy bilateral airspace opacities. The heart size is mildly enlarged. There is no pneumothorax or large pleural effusion. No acute osseous abnormality. IMPRESSION: Hazy bilateral airspace opacities concerning for multifocal pneumonia (viral or bacterial). Electronically Signed   By: Constance Holster M.D.   On: 09/30/2020 21:13

## 2020-09-23 ENCOUNTER — Inpatient Hospital Stay (HOSPITAL_COMMUNITY): Payer: Medicare HMO

## 2020-09-23 LAB — COMPREHENSIVE METABOLIC PANEL WITH GFR
ALT: 250 U/L — ABNORMAL HIGH (ref 0–44)
AST: 110 U/L — ABNORMAL HIGH (ref 15–41)
Albumin: 2.9 g/dL — ABNORMAL LOW (ref 3.5–5.0)
Alkaline Phosphatase: 98 U/L (ref 38–126)
Anion gap: 13 (ref 5–15)
BUN: 27 mg/dL — ABNORMAL HIGH (ref 8–23)
CO2: 24 mmol/L (ref 22–32)
Calcium: 8.4 mg/dL — ABNORMAL LOW (ref 8.9–10.3)
Chloride: 102 mmol/L (ref 98–111)
Creatinine, Ser: 0.84 mg/dL (ref 0.61–1.24)
GFR, Estimated: >60 mL/min (ref >60)
Glucose, Bld: 114 mg/dL — ABNORMAL HIGH (ref 70–99)
Potassium: 4.1 mmol/L (ref 3.5–5.1)
Sodium: 139 mmol/L (ref 135–145)
Total Bilirubin: 1.1 mg/dL (ref 0.3–1.2)
Total Protein: 6.1 g/dL — ABNORMAL LOW (ref 6.5–8.1)

## 2020-09-23 LAB — CBC WITH DIFFERENTIAL/PLATELET
Abs Immature Granulocytes: 0.13 K/uL — ABNORMAL HIGH (ref 0.00–0.07)
Basophils Absolute: 0 K/uL (ref 0.0–0.1)
Basophils Relative: 0 %
Eosinophils Absolute: 0 K/uL (ref 0.0–0.5)
Eosinophils Relative: 0 %
HCT: 41.1 % (ref 39.0–52.0)
Hemoglobin: 14.1 g/dL (ref 13.0–17.0)
Immature Granulocytes: 1 %
Lymphocytes Relative: 3 %
Lymphs Abs: 0.4 K/uL — ABNORMAL LOW (ref 0.7–4.0)
MCH: 32.5 pg (ref 26.0–34.0)
MCHC: 34.3 g/dL (ref 30.0–36.0)
MCV: 94.7 fL (ref 80.0–100.0)
Monocytes Absolute: 0.3 K/uL (ref 0.1–1.0)
Monocytes Relative: 2 %
Neutro Abs: 14.3 K/uL — ABNORMAL HIGH (ref 1.7–7.7)
Neutrophils Relative %: 94 %
Platelets: 185 K/uL (ref 150–400)
RBC: 4.34 MIL/uL (ref 4.22–5.81)
RDW: 12.9 % (ref 11.5–15.5)
WBC: 15.1 K/uL — ABNORMAL HIGH (ref 4.0–10.5)
nRBC: 0 % (ref 0.0–0.2)

## 2020-09-23 LAB — D-DIMER, QUANTITATIVE: D-Dimer, Quant: 2.58 ug/mL-FEU — ABNORMAL HIGH (ref 0.00–0.50)

## 2020-09-23 LAB — C-REACTIVE PROTEIN: CRP: 0.5 mg/dL (ref <1.0)

## 2020-09-23 LAB — PROTIME-INR
INR: 1.2 (ref 0.8–1.2)
Prothrombin Time: 14.3 seconds (ref 11.4–15.2)

## 2020-09-23 LAB — BRAIN NATRIURETIC PEPTIDE: B Natriuretic Peptide: 70.2 pg/mL (ref 0.0–100.0)

## 2020-09-23 LAB — PROCALCITONIN: Procalcitonin: <0.1 ng/mL

## 2020-09-23 LAB — MAGNESIUM: Magnesium: 2.6 mg/dL — ABNORMAL HIGH (ref 1.7–2.4)

## 2020-09-23 MED ORDER — ENOXAPARIN SODIUM 120 MG/0.8ML ~~LOC~~ SOLN
120.0000 mg | Freq: Two times a day (BID) | SUBCUTANEOUS | Status: DC
  Administered 2020-09-23 – 2020-09-28 (×10): 120 mg via SUBCUTANEOUS
  Filled 2020-09-23 (×14): qty 0.8

## 2020-09-23 NOTE — Progress Notes (Signed)
OT Cancellation Note  Patient Details Name: Javier Peterson MRN: [id number] DOB: 16-Oct-1945   Cancelled Treatment:    Reason Eval/Treat Not Completed: Other (comment). Nsg states pt with increased O2 needs and has just proned  -pt with O2 sats improved with proning. Will see at later time.   Ramond Dial, OT/L   Acute OT Clinical Specialist Acute Rehabilitation Services Pager [phone] Office [phone]  09/23/2020, 3:16 PM

## 2020-09-23 NOTE — Plan of Care (Signed)
  Problem: Respiratory: Goal: Will maintain a patent airway Outcome: Progressing Goal: Complications related to the disease process, condition or treatment will be avoided or minimized Outcome: Progressing   Problem: Clinical Measurements: Goal: Respiratory complications will improve Outcome: Progressing Goal: Cardiovascular complication will be avoided Outcome: Progressing

## 2020-09-23 NOTE — Progress Notes (Signed)
PROGRESS NOTE                                                                                                                                                                                                             Patient Demographics:    Javier Peterson, is a 74 y.o. male, DOB - 11/30/1945, FXT:[id number]  Outpatient Primary MD for the patient is Patient, No Pcp Per    LOS - 5  Admit date - 09/10/2020    Chief Complaint  Patient presents with  . Fever       Brief Narrative (HPI from H&P)  - Javier Peterson is a 74 y.o. male with medical history significant of Glaucoma.  Pt presents to ED at [facility] with c/o fever, SOB.  Feeling unwell since Thursday, diagnosed with prostatitis on Friday and started cipro on Friday.  Dysuria has since resolved but continues to have fever up to 102.6, cough for past 2-3 days, N/V, syncope episode at home.  No leg swelling/pain, no abd pain, not vaccinated to COVID, was admitted with Acute Hypoxic Resp. failure due to Covid Pneumonia.   Subjective:   Sitting in recliner, denies any headache, no chest pain or abdominal pain.  Still is short of breath.   Assessment  & Plan :     1. Acute Hypoxic Resp. Failure due to Acute Covid 19 Viral Pneumonitis during the ongoing 2020 Covid 19 Pandemic - he is unfortunately not vaccinated and he has incurred severe parenchymal lung injury due to COVID-19 pneumonia and he delayed seeking medical attention for a few days and remained hypoxic with lung injury at home.    He has been placed on IV steroids, Remdesivir and Actemra x 1 on 09/18/20, 2nd dose 09/19/20.  Despite aggressive treatment it seems he has already sustained severe lung injury and continues to be tenuous on 35 L of heated high flow plus nonrebreather mask intermittently.  Will require weeks of medical care, if continues to get worse may require intubation.  Remains extremely  tenuous.  Encouraged the patient to sit up in chair in the daytime use I-S and flutter valve for pulmonary toiletry and then prone in bed when at night.  Will advance activity and titrate down oxygen as possible.   SpO2: (!) 88 % O2 Flow Rate (L/min): 40 L/min (40L HHFNC (per  MD)/15L NRB) FiO2 (%): 100 %  Recent Labs  Lab 09/27/2020 2028 09/12/2020 2042 09/15/2020 2320 09/17/20 0510 09/19/20 0114 09/20/20 0256 09/21/20 0454 09/22/20 0103 09/22/20 0745 09/23/20 0525  WBC  --  5.5  --    < > 7.4 10.9* 11.9* 12.8*  --  15.1*  HGB  --  13.9  --    < > 14.2 13.7 13.8 14.5  --  14.1  HCT  --  40.4  --    < > 41.4 39.4 41.0 41.0  --  41.1  PLT  --  122*  --    < > 124* 151 144* 146*  --  185  CRP  --   --   --    < > 4.5* 1.5* 0.8  --  0.6 0.5  BNP  --   --   --    < > 236.6* 67.8 76.4 156.3*  --  70.2  DDIMER  --  1.06*  --    < > 0.84* 1.05* 1.41*  --  1.87* 2.58*  PROCALCITON  --   --   --    < > <0.10 <0.10 0.10  --  <0.10 <0.10  AST  --  49*  --    < > 62* 175* 247*  --  151* 110*  ALT  --  34  --    < > 32 127* 285*  --  279* 250*  ALKPHOS  --  51  --    < > 48 61 77  --  90 98  BILITOT  --  0.8  --    < > 0.8 0.8 0.9  --  1.1 1.1  ALBUMIN  --  3.7  --    < > 2.8* 2.9* 2.9*  --  3.0* 2.9*  INR  --  1.1  --   --   --   --  1.2  --  1.2 1.2  LATICACIDVEN  --  1.3 1.5  --   --   --   --   --   --   --   SARSCOV2NAA POSITIVE*  --   --   --   --   --   --   --   --   --    < > = values in this interval not displayed.     2.  Mild worsening transaminitis.  Likely due to COVID-19 inflammation and possibly recent use of remdesivir.  Monitor trend and INR.  3.  Rising D-dimer despite being on high-dose prophylactic Lovenox, leg ultrasound negative, since he is extremely tenuous and extremely hypoxic due to Covid pneumonia he has no reserve and if he develops a PE he will not survive that event.  We will transition him to therapeutic Lovenox till D-dimer is persistently below  2.   Condition - Extremely Guarded  Family Communication  :  Wife Judeen Hammans [phone] on 09/18/20, 09/20/20  Code Status :  Full  Consults  :  None  Procedures  :  None  PUD Prophylaxis : PPI  Disposition Plan  :    Status is: Inpatient  Remains inpatient appropriate because:IV treatments appropriate due to intensity of illness or inability to take PO  Dispo: The patient is from: Home              Anticipated d/c is to: Home              Anticipated d/c date is: > 3 days  Patient currently is not medically stable to d/c.  DVT Prophylaxis  :  Lovenox   Lab Results  Component Value Date   PLT 185 09/23/2020    Diet :  Diet Order            Diet Heart Room service appropriate? Yes; Fluid consistency: Thin  Diet effective now                  Inpatient Medications  Scheduled Meds: . albuterol  2 puff Inhalation TID  . vitamin C  500 mg Oral Daily  . enoxaparin (LOVENOX) injection  60 mg Subcutaneous Q12H  . latanoprost  1 drop Both Eyes QHS  . methylPREDNISolone (SOLU-MEDROL) injection  60 mg Intravenous Q12H  . zinc sulfate  220 mg Oral Daily   Continuous Infusions:  PRN Meds:.acetaminophen, albuterol, dextromethorphan-guaiFENesin, guaiFENesin-dextromethorphan, ondansetron (ZOFRAN) IV  Antibiotics  :    Anti-infectives (From admission, onward)   Start     Dose/Rate Route Frequency Ordered Stop   09/21/20 1530  remdesivir 100 mg in sodium chloride 0.9 % 100 mL IVPB        100 mg 200 mL/hr over 30 Minutes Intravenous Once 09/21/20 1437 09/21/20 1655   09/18/20 1000  remdesivir 100 mg in sodium chloride 0.9 % 100 mL IVPB  Status:  Discontinued        100 mg 200 mL/hr over 30 Minutes Intravenous Daily 09/17/20 0010 09/17/20 0908   09/17/20 1000  remdesivir 100 mg in sodium chloride 0.9 % 100 mL IVPB  Status:  Discontinued       "Followed by" Linked Group Details   100 mg 200 mL/hr over 30 Minutes Intravenous Daily 09/22/2020 2340 09/17/20 0009    09/17/20 0908  remdesivir 100 mg in sodium chloride 0.9 % 100 mL IVPB        100 mg 200 mL/hr over 30 Minutes Intravenous Daily 09/17/20 0908 09/21/20 0959   09/17/20 0015  remdesivir 100 mg in sodium chloride 0.9 % 100 mL IVPB        100 mg 200 mL/hr over 30 Minutes Intravenous Every 30 min 09/17/20 0010 09/17/20 0114   09/22/2020 2345  remdesivir 200 mg in sodium chloride 0.9% 250 mL IVPB  Status:  Discontinued       "Followed by" Linked Group Details   200 mg 580 mL/hr over 30 Minutes Intravenous Once 09/15/2020 2340 09/17/20 0009       Time Spent in minutes  30   Lala Lund M.D on 09/23/2020 at 10:56 AM  To page go to [email or web address]   [facility] -  Office  [phone]   See all Orders from today for further details    Objective:   Vitals:   09/22/20 2136 09/23/20 0504 09/23/20 0719 09/23/20 0900  BP:  124/67 131/74   Pulse: (!) 57 68 70 80  Resp: (!) 22 18 20 18   Temp:  98.3 F (36.8 C) 98 F (36.7 C)   TempSrc:  Oral Axillary   SpO2: 97% 93% 90% (!) 88%  Weight:      Height:        Wt Readings from Last 3 Encounters:  09/18/20 119.5 kg  05/22/17 124.3 kg  04/06/17 126.6 kg     Intake/Output Summary (Last 24 hours) at 09/23/2020 1056 Last data filed at 09/23/2020 0957 Gross per 24 hour  Intake 240 ml  Output 1120 ml  Net -880 ml     Physical Exam  Awake Alert, No  new F.N deficits, Normal affect [place].AT,PERRAL Supple Neck,No JVD, No cervical lymphadenopathy appriciated.  Symmetrical Chest wall movement, Good air movement bilaterally, CTAB RRR,No Gallops, Rubs or new Murmurs, No Parasternal Heave +ve B.Sounds, Abd Soft, No tenderness, No organomegaly appriciated, No rebound - guarding or rigidity. No Cyanosis, Clubbing or edema, No new Rash or bruise    Data Review:    CBC Recent Labs  Lab 09/19/20 0114 09/20/20 0256 09/21/20 0454 09/22/20 0103 09/23/20 0525  WBC 7.4 10.9* 11.9* 12.8* 15.1*  HGB 14.2 13.7 13.8 14.5 14.1  HCT  41.4 39.4 41.0 41.0 41.1  PLT 124* 151 144* 146* 185  MCV 94.5 94.3 95.1 94.3 94.7  MCH 32.4 32.8 32.0 33.3 32.5  MCHC 34.3 34.8 33.7 35.4 34.3  RDW 13.2 13.2 13.1 13.1 12.9  LYMPHSABS 0.4* 0.3* 0.4* 0.5* 0.4*  MONOABS 0.3 0.4 0.5 0.3 0.3  EOSABS 0.0 0.0 0.0 0.0 0.0  BASOSABS 0.0 0.0 0.0 0.0 0.0    Recent Labs  Lab 10/06/2020 2042 09/29/2020 2042 09/21/2020 2320 09/17/20 0510 09/18/20 0732 09/19/20 0114 09/20/20 0256 09/21/20 0454 09/22/20 0103 09/22/20 0745 09/23/20 0525  NA 132*  --   --  133*  --  136 137 140  --  137 139  K 3.4*  --   --  3.8  --  3.8 3.8 3.8  --  3.9 4.1  CL 99  --   --  98  --  101 99 102  --  102 102  CO2 22  --   --  26  --  26 28 27   --  26 24  GLUCOSE 130*  --   --  135*  --  134* 141* 139*  --  129* 114*  BUN 21  --   --  19  --  21 28* 26*  --  26* 27*  CREATININE 0.98  --   --  0.90  --  0.81 0.97 0.81  --  0.83 0.84  CALCIUM 8.3*  --   --  8.1*  --  8.3* 8.2* 8.2*  --  8.3* 8.4*  AST 49*  --   --  50*  --  62* 175* 247*  --  151* 110*  ALT 34  --   --  32  --  32 127* 285*  --  279* 250*  ALKPHOS 51  --   --  50  --  48 61 77  --  90 98  BILITOT 0.8  --   --  0.6  --  0.8 0.8 0.9  --  1.1 1.1  ALBUMIN 3.7  --   --  3.3*  --  2.8* 2.9* 2.9*  --  3.0* 2.9*  MG  --    < >  --  2.0  --  2.2 2.4 2.4  --  2.5* 2.6*  CRP  --    < >  --  5.8*  --  4.5* 1.5* 0.8  --  0.6 0.5  DDIMER 1.06*  --   --  0.71*  --  0.84* 1.05* 1.41*  --  1.87* 2.58*  PROCALCITON  --    < >  --  <0.10  --  <0.10 <0.10 0.10  --  <0.10 <0.10  LATICACIDVEN 1.3  --  1.5  --   --   --   --   --   --   --   --   INR 1.1  --   --   --   --   --   --  1.2  --  1.2 1.2  TSH  --   --   --  0.791  --   --   --   --   --   --   --   BNP  --   --   --   --    < > 236.6* 67.8 76.4 156.3*  --  70.2   < > = values in this interval not displayed.    ------------------------------------------------------------------------------------------------------------------ No results for input(s):  CHOL, HDL, LDLCALC, TRIG, CHOLHDL, LDLDIRECT in the last 72 hours.  No results found for: HGBA1C ------------------------------------------------------------------------------------------------------------------ No results for input(s): TSH, T4TOTAL, T3FREE, THYROIDAB in the last 72 hours.  Invalid input(s): FREET3  Cardiac Enzymes No results for input(s): CKMB, TROPONINI, MYOGLOBIN in the last 168 hours.  Invalid input(s): CK ------------------------------------------------------------------------------------------------------------------    Component Value Date/Time   BNP 70.2 09/23/2020 0525    Micro Results Recent Results (from the past 240 hour(s))  Resp Panel by RT-PCR (Flu A&B, Covid) Nasopharyngeal Swab     Status: Abnormal   Collection Time: 09/20/2020  8:28 PM   Specimen: Nasopharyngeal Swab; Nasopharyngeal(NP) swabs in vial transport medium  Result Value Ref Range Status   SARS Coronavirus 2 by RT PCR POSITIVE (A) NEGATIVE Final    Comment: RESULT CALLED TO, READ BACK BY AND VERIFIED WITH: ADKINS,L AT 2116 ON [id number] BY CHERESNOWSKY,T (NOTE) SARS-CoV-2 target nucleic acids are DETECTED.  The SARS-CoV-2 RNA is generally detectable in upper respiratory specimens during the acute phase of infection. Positive results are indicative of the presence of the identified virus, but do not rule out bacterial infection or co-infection with other pathogens not detected by the test. Clinical correlation with patient history and other diagnostic information is necessary to determine patient infection status. The expected result is Negative.  Fact Sheet for Patients: EntrepreneurPulse.com.au  Fact Sheet for Healthcare Providers: IncredibleEmployment.be  This test is not yet approved or cleared by the [place] FDA and  has been authorized for detection and/or diagnosis of SARS-CoV-2 by FDA under an Emergency Use Authorization (EUA).  This  EUA will remain in effect (meaning this  test can be used) for the duration of  the COVID-19 declaration under Section 564(b)(1) of the Act, 21 U.S.C. section 360bbb-3(b)(1), unless the authorization is terminated or revoked sooner.     Influenza A by PCR NEGATIVE NEGATIVE Final   Influenza B by PCR NEGATIVE NEGATIVE Final    Comment: (NOTE) The Xpert Xpress SARS-CoV-2/FLU/RSV plus assay is intended as an aid in the diagnosis of influenza from Nasopharyngeal swab specimens and should not be used as a sole basis for treatment. Nasal washings and aspirates are unacceptable for Xpert Xpress SARS-CoV-2/FLU/RSV testing.  Fact Sheet for Patients: EntrepreneurPulse.com.au  Fact Sheet for Healthcare Providers: IncredibleEmployment.be  This test is not yet approved or cleared by the [place] FDA and has been authorized for detection and/or diagnosis of SARS-CoV-2 by FDA under an Emergency Use Authorization (EUA). This EUA will remain in effect (meaning this test can be used) for the duration of the COVID-19 declaration under Section 564(b)(1) of the Act, 21 U.S.C. section 360bbb-3(b)(1), unless the authorization is terminated or revoked.  Performed at [facility], [address] [place], [place] [address]   Culture, blood (Routine x 2)     Status: None   Collection Time: 09/24/2020  8:43 PM   Specimen: BLOOD  Result Value Ref Range Status   Specimen Description   Final  BLOOD BLOOD RIGHT FOREARM Performed at [facility], [address] [place], [place] [address]    Special Requests   Final    BOTTLES DRAWN AEROBIC AND ANAEROBIC Blood Culture adequate volume Performed at [facility], [address] [place], [place] [address]    Culture   Final    NO GROWTH 5 DAYS Performed at [facility], [place] [address], [place], [place] [address]    Report Status 09/21/2020 FINAL  Final  Culture, blood  (Routine x 2)     Status: None   Collection Time: 10/08/2020  9:09 PM   Specimen: BLOOD  Result Value Ref Range Status   Specimen Description   Final    BLOOD BLOOD LEFT HAND Performed at [facility], [address] [place], [place] [address]    Special Requests   Final    BOTTLES DRAWN AEROBIC AND ANAEROBIC Blood Culture adequate volume Performed at [facility], [address] [place], [place] [address]    Culture   Final    NO GROWTH 5 DAYS Performed at [facility], [place] [address], [place], [place] [address]    Report Status 09/21/2020 FINAL  Final  MRSA PCR Screening     Status: None   Collection Time: 09/18/20  7:51 AM   Specimen: Nasopharyngeal  Result Value Ref Range Status   MRSA by PCR NEGATIVE NEGATIVE Final    Comment:        The GeneXpert MRSA Assay (FDA approved for NASAL specimens only), is one component of a comprehensive MRSA colonization surveillance program. It is not intended to diagnose MRSA infection nor to guide or monitor treatment for MRSA infections. Performed at [facility], [place] [address] [place],  [address]     Radiology Reports DG Chest [place] 1 View  Result Date: 09/23/2020 CLINICAL DATA:  Shortness of breath.  COVID-19 virus infection. EXAM: PORTABLE CHEST 1 VIEW COMPARISON:  09/21/2020 FINDINGS: Heart size remains stable. Previously seen subtle areas of heterogeneous opacity in the lung bases is seen have improved. No new or worsening areas of pulmonary opacity are seen. No evidence of pleural effusion. IMPRESSION: Interval improvement in subtle bibasilar areas of heterogeneous opacity. Stable cardiomegaly. Electronically Signed   By: Marlaine Hind M.D.   On: 09/23/2020 09:00   DG Chest Port 1 View  Result Date: 09/21/2020 CLINICAL DATA:  Shortness of breath. EXAM: PORTABLE CHEST 1 VIEW COMPARISON:  September 19, 2020. FINDINGS: Stable cardiomegaly. No pneumothorax or pleural effusion is noted. Mild  bibasilar opacities are noted concerning for infiltrates and multifocal pneumonia. Bony thorax is unremarkable. IMPRESSION: Mild bibasilar opacities are noted concerning for multifocal pneumonia. Electronically Signed   By: Marijo Conception M.D.   On: 09/21/2020 09:53   DG Chest Port 1 View  Result Date: 09/19/2020 CLINICAL DATA:  Shortness of breath.  COVID positive. EXAM: PORTABLE CHEST 1 VIEW COMPARISON:  September 16, 2020. FINDINGS: Increased conspicuity of hazy and interstitial opacities involving the left greater than right lung bases. No visible pleural effusions or pneumothorax. Biapical pleuroparenchymal scarring. Mild cardiac enlargement. IMPRESSION: Increased conspicuity of hazy and interstitial opacities involving the left greater than right lung bases, concerning for multifocal pneumonia in the setting of reported COVID positivity. Electronically Signed   By: Margaretha Sheffield MD   On: 09/19/2020 08:56   DG Chest Portable 1 View  Result Date: 10/02/2020 CLINICAL DATA:  Fever  EXAM: PORTABLE CHEST 1 VIEW COMPARISON:  None. FINDINGS: There are hazy bilateral airspace opacities. The heart size is mildly enlarged. There is no pneumothorax or large pleural effusion. No acute osseous abnormality. IMPRESSION: Hazy bilateral airspace opacities concerning for multifocal pneumonia (viral or bacterial). Electronically Signed   By: Constance Holster M.D.   On: 10/04/2020 21:13   VAS [place] LOWER EXTREMITY VENOUS (DVT)  Result Date: 09/22/2020  Lower Venous DVT Study Other Indications: Covid, D-Dimer. Comparison Study: No previous exam Performing Technologist: Vonzell Schlatter RVT  Examination Guidelines: A complete evaluation includes B-mode imaging, spectral Doppler, color Doppler, and power Doppler as needed of all accessible portions of each vessel. Bilateral testing is considered an integral part of a complete examination. Limited examinations for reoccurring indications may be performed as noted. The reflux  portion of the exam is performed with the patient in reverse Trendelenburg.  +---------+---------------+---------+-----------+----------+--------------+ RIGHT    CompressibilityPhasicitySpontaneityPropertiesThrombus Aging +---------+---------------+---------+-----------+----------+--------------+ CFV      Full           Yes      Yes                                 +---------+---------------+---------+-----------+----------+--------------+ SFJ      Full                                                        +---------+---------------+---------+-----------+----------+--------------+ FV Prox  Full                                                        +---------+---------------+---------+-----------+----------+--------------+ FV Mid   Full                                                        +---------+---------------+---------+-----------+----------+--------------+ FV DistalFull                                                        +---------+---------------+---------+-----------+----------+--------------+ PFV      Full                                                        +---------+---------------+---------+-----------+----------+--------------+ POP      Full           Yes      Yes                                 +---------+---------------+---------+-----------+----------+--------------+ PTV      Full                                                        +---------+---------------+---------+-----------+----------+--------------+  PERO     Full                                                        +---------+---------------+---------+-----------+----------+--------------+   +---------+---------------+---------+-----------+----------+--------------+ LEFT     CompressibilityPhasicitySpontaneityPropertiesThrombus Aging +---------+---------------+---------+-----------+----------+--------------+ CFV      Full           Yes      Yes                                  +---------+---------------+---------+-----------+----------+--------------+ SFJ      Full                                                        +---------+---------------+---------+-----------+----------+--------------+ FV Prox  Full                                                        +---------+---------------+---------+-----------+----------+--------------+ FV Mid   Full                                                        +---------+---------------+---------+-----------+----------+--------------+ FV DistalFull                                                        +---------+---------------+---------+-----------+----------+--------------+ PFV      Full                                                        +---------+---------------+---------+-----------+----------+--------------+ POP      Full           Yes      Yes                                 +---------+---------------+---------+-----------+----------+--------------+ PTV      Full                                                        +---------+---------------+---------+-----------+----------+--------------+ PERO     Full                                                        +---------+---------------+---------+-----------+----------+--------------+  Summary: RIGHT: - There is no evidence of deep vein thrombosis in the lower extremity.  - No cystic structure found in the popliteal fossa.  LEFT: - There is no evidence of deep vein thrombosis in the lower extremity.  - No cystic structure found in the popliteal fossa.  *See table(s) above for measurements and observations. Electronically signed by Ruta Hinds MD on 09/22/2020 at 7:47:29 PM.    Final

## 2020-09-24 ENCOUNTER — Inpatient Hospital Stay (HOSPITAL_COMMUNITY): Payer: Medicare HMO

## 2020-09-24 DIAGNOSIS — R55 Syncope and collapse: Secondary | ICD-10-CM

## 2020-09-24 DIAGNOSIS — J9601 Acute respiratory failure with hypoxia: Secondary | ICD-10-CM

## 2020-09-24 DIAGNOSIS — R0602 Shortness of breath: Secondary | ICD-10-CM

## 2020-09-24 DIAGNOSIS — U071 COVID-19: Principal | ICD-10-CM

## 2020-09-24 LAB — COMPREHENSIVE METABOLIC PANEL
ALT: 242 U/L — ABNORMAL HIGH (ref 0–44)
AST: 97 U/L — ABNORMAL HIGH (ref 15–41)
Albumin: 2.9 g/dL — ABNORMAL LOW (ref 3.5–5.0)
Alkaline Phosphatase: 104 U/L (ref 38–126)
Anion gap: 11 (ref 5–15)
BUN: 31 mg/dL — ABNORMAL HIGH (ref 8–23)
CO2: 25 mmol/L (ref 22–32)
Calcium: 8.3 mg/dL — ABNORMAL LOW (ref 8.9–10.3)
Chloride: 102 mmol/L (ref 98–111)
Creatinine, Ser: 0.85 mg/dL (ref 0.61–1.24)
GFR, Estimated: >60 mL/min (ref >60)
Glucose, Bld: 127 mg/dL — ABNORMAL HIGH (ref 70–99)
Potassium: 4.3 mmol/L (ref 3.5–5.1)
Sodium: 138 mmol/L (ref 135–145)
Total Bilirubin: 1.1 mg/dL (ref 0.3–1.2)
Total Protein: 6.2 g/dL — ABNORMAL LOW (ref 6.5–8.1)

## 2020-09-24 LAB — CBC WITH DIFFERENTIAL/PLATELET
Abs Immature Granulocytes: 0 10*3/uL (ref 0.00–0.07)
Basophils Absolute: 0 10*3/uL (ref 0.0–0.1)
Basophils Relative: 0 %
Eosinophils Absolute: 0 10*3/uL (ref 0.0–0.5)
Eosinophils Relative: 0 %
HCT: 42.2 % (ref 39.0–52.0)
Hemoglobin: 14.3 g/dL (ref 13.0–17.0)
Lymphocytes Relative: 2 %
Lymphs Abs: 0.2 10*3/uL — ABNORMAL LOW (ref 0.7–4.0)
MCH: 31.8 pg (ref 26.0–34.0)
MCHC: 33.9 g/dL (ref 30.0–36.0)
MCV: 94 fL (ref 80.0–100.0)
Monocytes Absolute: 0.3 10*3/uL (ref 0.1–1.0)
Monocytes Relative: 3 %
Neutro Abs: 9.1 10*3/uL — ABNORMAL HIGH (ref 1.7–7.7)
Neutrophils Relative %: 95 %
Platelets: 184 10*3/uL (ref 150–400)
RBC: 4.49 MIL/uL (ref 4.22–5.81)
RDW: 13.1 % (ref 11.5–15.5)
WBC: 9.6 10*3/uL (ref 4.0–10.5)
nRBC: 0 % (ref 0.0–0.2)
nRBC: 0 /100 WBC

## 2020-09-24 LAB — PROTIME-INR
INR: 1.2 (ref 0.8–1.2)
Prothrombin Time: 14.9 seconds (ref 11.4–15.2)

## 2020-09-24 LAB — D-DIMER, QUANTITATIVE: D-Dimer, Quant: 3.66 ug/mL-FEU — ABNORMAL HIGH (ref 0.00–0.50)

## 2020-09-24 LAB — BRAIN NATRIURETIC PEPTIDE: B Natriuretic Peptide: 34.5 pg/mL (ref 0.0–100.0)

## 2020-09-24 LAB — C-REACTIVE PROTEIN: CRP: <0.5 mg/dL (ref <1.0)

## 2020-09-24 LAB — PROCALCITONIN: Procalcitonin: <0.1 ng/mL

## 2020-09-24 LAB — MAGNESIUM: Magnesium: 2.6 mg/dL — ABNORMAL HIGH (ref 1.7–2.4)

## 2020-09-24 NOTE — Progress Notes (Signed)
[facility]                                                                              Patient Demographics  Javier Peterson, is a 74 y.o. male, DOB - 08/26/1946, WUX:[id number]  Admit date - 09/22/2020   Admitting Physician Vernelle Emerald, MD  Outpatient Primary MD for the patient is Patient, No Pcp Per  Outpatient specialists:   LOS - 6  days   Medical records reviewed and are as summarized below:    Chief Complaint  Patient presents with  . Fever       Brief summary   GENTRY PILSON a 74 y.o.malewith medical history significant ofGlaucoma. Pt presents to ED at [facility] with c/o fever, SOB. Feeling unwell since Thursday, diagnosed with prostatitis on Friday and started cipro on Friday. Dysuria has since resolved but continues to have fever up to 102.6, cough for past 2-3 days, N/V, syncope episode at home.  No leg swelling/pain, no abd pain, not vaccinated to COVID, was admitted with Acute Hypoxic Resp. failure due to Covid Pneumonia.    Assessment & Plan    Principal Problem:  Acute hypoxic respiratory failure due to acute COVID-19 viral pneumonia during the ongoing COVID-19 pandemic- POA - Patient presented with shortness of breath, fevers, coughing, nausea vomiting and syncopal episode at home secondary to COVID-19 pneumonia. -Patient was placed on IV steroids, remdesivir, Actemra, first dose on 09/18/2020, second dose on 09/19/2020.  Despite aggressive treatment, appears to have severe lung injury and continues to have tenuous respiratory status.  O2 sats drops on minimal exertion, PCCM consulted. - Continue Supportive care: vitamin C/zinc, albuterol, Tylenol. - Continue to wean oxygen, ambulatory O2 screening daily as tolerated -D-dimer trending up, 3.6 this.  BNP 34.5.  Procalcitonin less than 0.1  - Oxygen - SpO2: (!) 87 % O2 Flow Rate (L/min): 60 L/min FiO2 (%): 100 % - Continue to follow labs as below  Lab Results  Component  Value Date   SARSCOV2NAA POSITIVE (A) 09/22/2020     Recent Labs  Lab 09/20/20 0256 09/21/20 0454 09/22/20 0745 09/23/20 0525 09/24/20 0103  DDIMER 1.05* 1.41* 1.87* 2.58* 3.66*  CRP 1.5* 0.8 0.6 0.5 <0.5  ALT 127* 285* 279* 250* 242*  PROCALCITON <0.10 0.10 <0.10 <0.10 <0.10    Rising D-dimer -Despite of being on high-dose prophylactic Lovenox.  Doppler ultrasound negative.  - Still hypoxic and extremely tenuous, continue therapeutic Lovenox till D-dimer persistently below 2 -Will obtain CT angiogram chest  Mildly worsening transaminitis Likely due to COVID-19 inflammation, recent use of remdesivir, LFTs trending down   Obesity Estimated body mass index is 33.82 kg/m as calculated from the following:   Height as of this encounter: 6\' 2"  (1.88 m).   Weight as of this encounter: 119.5 kg.  Code Status: Full CODE STATUS DVT Prophylaxis:  Lovenox  Family Communication: Discussed all imaging results, lab results, explained to the patient's wife, Judeen Hammans [phone] today 09/24/20     Disposition Plan:     Status is: Inpatient  Remains inpatient appropriate because:Inpatient level of care appropriate due to severity of illness   Dispo: The  patient is from: Home              Anticipated d/c is to: Home              Anticipated d/c date is: 3 days              Patient currently is not medically stable to d/c.  Severely hypoxic, guarded prognosis,      Time Spent in minutes   35 minutes  Procedures:  None  Consultants:   Pulmonary critical care  Antimicrobials:   Anti-infectives (From admission, onward)   Start     Dose/Rate Route Frequency Ordered Stop   09/21/20 1530  remdesivir 100 mg in sodium chloride 0.9 % 100 mL IVPB        100 mg 200 mL/hr over 30 Minutes Intravenous Once 09/21/20 1437 09/21/20 1655   09/18/20 1000  remdesivir 100 mg in sodium chloride 0.9 % 100 mL IVPB  Status:  Discontinued        100 mg 200 mL/hr over 30 Minutes Intravenous Daily  09/17/20 0010 09/17/20 0908   09/17/20 1000  remdesivir 100 mg in sodium chloride 0.9 % 100 mL IVPB  Status:  Discontinued       "Followed by" Linked Group Details   100 mg 200 mL/hr over 30 Minutes Intravenous Daily 09/17/2020 2340 09/17/20 0009   09/17/20 0908  remdesivir 100 mg in sodium chloride 0.9 % 100 mL IVPB        100 mg 200 mL/hr over 30 Minutes Intravenous Daily 09/17/20 0908 09/21/20 0959   09/17/20 0015  remdesivir 100 mg in sodium chloride 0.9 % 100 mL IVPB        100 mg 200 mL/hr over 30 Minutes Intravenous Every 30 min 09/17/20 0010 09/17/20 0114   10/02/2020 2345  remdesivir 200 mg in sodium chloride 0.9% 250 mL IVPB  Status:  Discontinued       "Followed by" Linked Group Details   200 mg 580 mL/hr over 30 Minutes Intravenous Once 10/04/2020 2340 09/17/20 0009          Medications  Scheduled Meds: . albuterol  2 puff Inhalation TID  . vitamin C  500 mg Oral Daily  . enoxaparin (LOVENOX) injection  120 mg Subcutaneous Q12H  . latanoprost  1 drop Both Eyes QHS  . methylPREDNISolone (SOLU-MEDROL) injection  60 mg Intravenous Q12H  . zinc sulfate  220 mg Oral Daily   Continuous Infusions: PRN Meds:.acetaminophen, albuterol, dextromethorphan-guaiFENesin, guaiFENesin-dextromethorphan, ondansetron (ZOFRAN) IV      Subjective:   Javier Peterson was seen and examined today.  Sitting up in the bed, on nonrebreather and high flow [place], able to converse however sats drops in 80s.  No fevers or chills, still short of breath. Patient denies dizziness,  abdominal pain, N/V/D/C, new weakness, numbess, tingling. No acute events overnight.    Objective:   Vitals:   09/24/20 0458 09/24/20 0705 09/24/20 0800 09/24/20 0850  BP: (!) 121/96  (!) 105/51   Pulse: 66  80   Resp: 18 (!) 22 (!) 21   Temp: 98 F (36.7 C)  98.1 F (36.7 C)   TempSrc: Axillary  Axillary   SpO2: 96%  91% (!) 87%  Weight:      Height:        Intake/Output Summary (Last 24 hours) at 09/24/2020  1228 Last data filed at 09/24/2020 0800 Gross per 24 hour  Intake --  Output 720 ml  Net -720 ml  Wt Readings from Last 3 Encounters:  09/18/20 119.5 kg  05/22/17 124.3 kg  04/06/17 126.6 kg     Exam  General: Alert and oriented x 3, mild to moderate respiratory distress, on NRB and HFNC, short of breath on minimal exertion  Cardiovascular: S1 S2 auscultated, no murmurs, RRR  Respiratory: Decreased breath sound at the bases, no wheezing  Gastrointestinal: Soft, nontender, nondistended, + bowel sounds  Ext: no pedal edema bilaterally  Neuro: no new deficits  Musculoskeletal: No digital cyanosis, clubbing  Skin: No rashes  Psych: Normal affect and demeanor, alert and oriented x3    Data Reviewed:  I have personally reviewed following labs and imaging studies  Micro Results Recent Results (from the past 240 hour(s))  Resp Panel by RT-PCR (Flu A&B, Covid) Nasopharyngeal Swab     Status: Abnormal   Collection Time: 09/22/2020  8:28 PM   Specimen: Nasopharyngeal Swab; Nasopharyngeal(NP) swabs in vial transport medium  Result Value Ref Range Status   SARS Coronavirus 2 by RT PCR POSITIVE (A) NEGATIVE Final    Comment: RESULT CALLED TO, READ BACK BY AND VERIFIED WITH: ADKINS,L AT 2116 ON [id number] BY CHERESNOWSKY,T (NOTE) SARS-CoV-2 target nucleic acids are DETECTED.  The SARS-CoV-2 RNA is generally detectable in upper respiratory specimens during the acute phase of infection. Positive results are indicative of the presence of the identified virus, but do not rule out bacterial infection or co-infection with other pathogens not detected by the test. Clinical correlation with patient history and other diagnostic information is necessary to determine patient infection status. The expected result is Negative.  Fact Sheet for Patients: EntrepreneurPulse.com.au  Fact Sheet for Healthcare Providers: IncredibleEmployment.be  This test  is not yet approved or cleared by the [place] FDA and  has been authorized for detection and/or diagnosis of SARS-CoV-2 by FDA under an Emergency Use Authorization (EUA).  This EUA will remain in effect (meaning this  test can be used) for the duration of  the COVID-19 declaration under Section 564(b)(1) of the Act, 21 U.S.C. section 360bbb-3(b)(1), unless the authorization is terminated or revoked sooner.     Influenza A by PCR NEGATIVE NEGATIVE Final   Influenza B by PCR NEGATIVE NEGATIVE Final    Comment: (NOTE) The Xpert Xpress SARS-CoV-2/FLU/RSV plus assay is intended as an aid in the diagnosis of influenza from Nasopharyngeal swab specimens and should not be used as a sole basis for treatment. Nasal washings and aspirates are unacceptable for Xpert Xpress SARS-CoV-2/FLU/RSV testing.  Fact Sheet for Patients: EntrepreneurPulse.com.au  Fact Sheet for Healthcare Providers: IncredibleEmployment.be  This test is not yet approved or cleared by the [place] FDA and has been authorized for detection and/or diagnosis of SARS-CoV-2 by FDA under an Emergency Use Authorization (EUA). This EUA will remain in effect (meaning this test can be used) for the duration of the COVID-19 declaration under Section 564(b)(1) of the Act, 21 U.S.C. section 360bbb-3(b)(1), unless the authorization is terminated or revoked.  Performed at [facility], [address] [place], [place] [address]   Culture, blood (Routine x 2)     Status: None   Collection Time: 09/11/2020  8:43 PM   Specimen: BLOOD  Result Value Ref Range Status   Specimen Description   Final    BLOOD BLOOD RIGHT FOREARM Performed at [facility], [address] [place], [place] [address]    Special Requests   Final    BOTTLES DRAWN AEROBIC AND ANAEROBIC Blood Culture  adequate volume Performed at [facility] [address] [place], [place]  [address]    Culture   Final    NO GROWTH 5 DAYS Performed at [facility], [place] [address] [place], [place] [address]    Report Status 09/21/2020 FINAL  Final  Culture, blood (Routine x 2)     Status: None   Collection Time: 09/26/2020  9:09 PM   Specimen: BLOOD  Result Value Ref Range Status   Specimen Description   Final    BLOOD BLOOD LEFT HAND Performed at [facility], [address] [place], [place] [address]    Special Requests   Final    BOTTLES DRAWN AEROBIC AND ANAEROBIC Blood Culture adequate volume Performed at [facility], [address] [place], [place] [address]    Culture   Final    NO GROWTH 5 DAYS Performed at [facility], [place] [address], [place], [place] [address]    Report Status 09/21/2020 FINAL  Final  MRSA PCR Screening     Status: None   Collection Time: 09/18/20  7:51 AM   Specimen: Nasopharyngeal  Result Value Ref Range Status   MRSA by PCR NEGATIVE NEGATIVE Final    Comment:        The GeneXpert MRSA Assay (FDA approved for NASAL specimens only), is one component of a comprehensive MRSA colonization surveillance program. It is not intended to diagnose MRSA infection nor to guide or monitor treatment for MRSA infections. Performed at [facility], [place] [address] [place], [place] [address]     Radiology Reports DG Chest [place] 1 View  Result Date: 09/23/2020 CLINICAL DATA:  Shortness of breath.  COVID-19 virus infection. EXAM: PORTABLE CHEST 1 VIEW COMPARISON:  09/21/2020 FINDINGS: Heart size remains stable. Previously seen subtle areas of heterogeneous opacity in the lung bases is seen have improved. No new or worsening areas of pulmonary opacity are seen. No evidence of pleural effusion. IMPRESSION: Interval improvement in subtle bibasilar areas of heterogeneous opacity. Stable cardiomegaly. Electronically Signed   By: Marlaine Hind M.D.   On: 09/23/2020 09:00   DG Chest Port 1 View  Result Date:  09/21/2020 CLINICAL DATA:  Shortness of breath. EXAM: PORTABLE CHEST 1 VIEW COMPARISON:  September 19, 2020. FINDINGS: Stable cardiomegaly. No pneumothorax or pleural effusion is noted. Mild bibasilar opacities are noted concerning for infiltrates and multifocal pneumonia. Bony thorax is unremarkable. IMPRESSION: Mild bibasilar opacities are noted concerning for multifocal pneumonia. Electronically Signed   By: Marijo Conception M.D.   On: 09/21/2020 09:53   DG Chest Port 1 View  Result Date: 09/19/2020 CLINICAL DATA:  Shortness of breath.  COVID positive. EXAM: PORTABLE CHEST 1 VIEW COMPARISON:  September 16, 2020. FINDINGS: Increased conspicuity of hazy and interstitial opacities involving the left greater than right lung bases. No visible pleural effusions or pneumothorax. Biapical pleuroparenchymal scarring. Mild cardiac enlargement. IMPRESSION: Increased conspicuity of hazy and interstitial opacities involving the left greater than right lung bases, concerning for multifocal pneumonia in the setting of reported COVID positivity. Electronically Signed   By: Margaretha Sheffield MD   On: 09/19/2020 08:56   DG Chest Portable 1 View  Result Date: 09/19/2020 CLINICAL DATA:  Fever EXAM: PORTABLE CHEST 1 VIEW COMPARISON:  None. FINDINGS: There are hazy bilateral airspace opacities. The heart size is mildly enlarged. There is no pneumothorax or large pleural effusion. No acute osseous abnormality. IMPRESSION: Hazy bilateral  airspace opacities concerning for multifocal pneumonia (viral or bacterial). Electronically Signed   By: Constance Holster M.D.   On: 09/20/2020 21:13   VAS [place] LOWER EXTREMITY VENOUS (DVT)  Result Date: 09/22/2020  Lower Venous DVT Study Other Indications: Covid, D-Dimer. Comparison Study: No previous exam Performing Technologist: Vonzell Schlatter RVT  Examination Guidelines: A complete evaluation includes B-mode imaging, spectral Doppler, color Doppler, and power Doppler as needed of all  accessible portions of each vessel. Bilateral testing is considered an integral part of a complete examination. Limited examinations for reoccurring indications may be performed as noted. The reflux portion of the exam is performed with the patient in reverse Trendelenburg.  +---------+---------------+---------+-----------+----------+--------------+ RIGHT    CompressibilityPhasicitySpontaneityPropertiesThrombus Aging +---------+---------------+---------+-----------+----------+--------------+ CFV      Full           Yes      Yes                                 +---------+---------------+---------+-----------+----------+--------------+ SFJ      Full                                                        +---------+---------------+---------+-----------+----------+--------------+ FV Prox  Full                                                        +---------+---------------+---------+-----------+----------+--------------+ FV Mid   Full                                                        +---------+---------------+---------+-----------+----------+--------------+ FV DistalFull                                                        +---------+---------------+---------+-----------+----------+--------------+ PFV      Full                                                        +---------+---------------+---------+-----------+----------+--------------+ POP      Full           Yes      Yes                                 +---------+---------------+---------+-----------+----------+--------------+ PTV      Full                                                        +---------+---------------+---------+-----------+----------+--------------+  PERO     Full                                                        +---------+---------------+---------+-----------+----------+--------------+   +---------+---------------+---------+-----------+----------+--------------+  LEFT     CompressibilityPhasicitySpontaneityPropertiesThrombus Aging +---------+---------------+---------+-----------+----------+--------------+ CFV      Full           Yes      Yes                                 +---------+---------------+---------+-----------+----------+--------------+ SFJ      Full                                                        +---------+---------------+---------+-----------+----------+--------------+ FV Prox  Full                                                        +---------+---------------+---------+-----------+----------+--------------+ FV Mid   Full                                                        +---------+---------------+---------+-----------+----------+--------------+ FV DistalFull                                                        +---------+---------------+---------+-----------+----------+--------------+ PFV      Full                                                        +---------+---------------+---------+-----------+----------+--------------+ POP      Full           Yes      Yes                                 +---------+---------------+---------+-----------+----------+--------------+ PTV      Full                                                        +---------+---------------+---------+-----------+----------+--------------+ PERO     Full                                                        +---------+---------------+---------+-----------+----------+--------------+  Summary: RIGHT: - There is no evidence of deep vein thrombosis in the lower extremity.  - No cystic structure found in the popliteal fossa.  LEFT: - There is no evidence of deep vein thrombosis in the lower extremity.  - No cystic structure found in the popliteal fossa.  *See table(s) above for measurements and observations. Electronically signed by Ruta Hinds MD on 09/22/2020 at 7:47:29 PM.    Final     Lab  Data:  CBC: Recent Labs  Lab 09/20/20 0256 09/21/20 0454 09/22/20 0103 09/23/20 0525 09/24/20 0103  WBC 10.9* 11.9* 12.8* 15.1* 9.6  NEUTROABS 10.1* 10.9* 11.9* 14.3* 9.1*  HGB 13.7 13.8 14.5 14.1 14.3  HCT 39.4 41.0 41.0 41.1 42.2  MCV 94.3 95.1 94.3 94.7 94.0  PLT 151 144* 146* 185 458   Basic Metabolic Panel: Recent Labs  Lab 09/20/20 0256 09/21/20 0454 09/22/20 0745 09/23/20 0525 09/24/20 0103  NA 137 140 137 139 138  K 3.8 3.8 3.9 4.1 4.3  CL 99 102 102 102 102  CO2 28 27 26 24 25   GLUCOSE 141* 139* 129* 114* 127*  BUN 28* 26* 26* 27* 31*  CREATININE 0.97 0.81 0.83 0.84 0.85  CALCIUM 8.2* 8.2* 8.3* 8.4* 8.3*  MG 2.4 2.4 2.5* 2.6* 2.6*   GFR: Estimated Creatinine Clearance: 106.3 mL/min (by C-G formula based on SCr of 0.85 mg/dL). Liver Function Tests: Recent Labs  Lab 09/20/20 0256 09/21/20 0454 09/22/20 0745 09/23/20 0525 09/24/20 0103  AST 175* 247* 151* 110* 97*  ALT 127* 285* 279* 250* 242*  ALKPHOS 61 77 90 98 104  BILITOT 0.8 0.9 1.1 1.1 1.1  PROT 6.5 6.5 6.6 6.1* 6.2*  ALBUMIN 2.9* 2.9* 3.0* 2.9* 2.9*   No results for input(s): LIPASE, AMYLASE in the last 168 hours. No results for input(s): AMMONIA in the last 168 hours. Coagulation Profile: Recent Labs  Lab 09/21/20 0454 09/22/20 0745 09/23/20 0525 09/24/20 0103  INR 1.2 1.2 1.2 1.2   Cardiac Enzymes: No results for input(s): CKTOTAL, CKMB, CKMBINDEX, TROPONINI in the last 168 hours. BNP (last 3 results) No results for input(s): PROBNP in the last 8760 hours. HbA1C: No results for input(s): HGBA1C in the last 72 hours. CBG: No results for input(s): GLUCAP in the last 168 hours. Lipid Profile: No results for input(s): CHOL, HDL, LDLCALC, TRIG, CHOLHDL, LDLDIRECT in the last 72 hours. Thyroid Function Tests: No results for input(s): TSH, T4TOTAL, FREET4, T3FREE, THYROIDAB in the last 72 hours. Anemia Panel: No results for input(s): VITAMINB12, FOLATE, FERRITIN, TIBC, IRON,  RETICCTPCT in the last 72 hours. Urine analysis:    Component Value Date/Time   COLORURINE YELLOW 09/17/2020 0011   APPEARANCEUR CLEAR 09/17/2020 0011   LABSPEC >1.030 (H) 09/17/2020 0011   PHURINE 5.5 09/17/2020 0011   GLUCOSEU NEGATIVE 09/17/2020 0011   HGBUR TRACE (A) 09/17/2020 0011   BILIRUBINUR NEGATIVE 09/17/2020 0011   KETONESUR NEGATIVE 09/17/2020 0011   PROTEINUR 100 (A) 09/17/2020 0011   NITRITE NEGATIVE 09/17/2020 0011   LEUKOCYTESUR NEGATIVE 09/17/2020 0011     Elizabth Palka M.D. [facility] 09/24/2020, 12:28 PM   Call night coverage person covering after 7pm

## 2020-09-24 NOTE — Progress Notes (Signed)
Occupational Therapy Treatment Patient Details Name: Javier Peterson MRN: [id number] DOB: 1946-10-11 Today's Date: 09/24/2020    History of present illness 74 y.o. male with medical history significant of glaucoma.  Pt presents to ED 10/03/2020 at [facility] with c/o fever, SOB; diagnosed with prostatitis 12/3 and started cipro with dysuria resolved but continues to have fever up to 102.6, cough, N/V, and syncope episode at home. +COVID (12/7); not vaccinated   OT comments  Pt seen for OT follow up session with focus on energy conservation strategies. Since previous OT session, pt has had significant increase in O2 demands (see below). Pt completed self proning with supervision with improvement of sats to 99% on 60L HHFNC +15L NRB. With movement pt de sats and remains high 80s, low 90s when sitting up at rest. Per RN who entered room, pt is to go to imaging for possible PE. OT held further mobility at this time and provided education to pt. Pt appears to have decreased awareness, stating "my breathing is fine it feels better". Updated recs to [facility] at this time, will continue to follow as medically appropriate. Noted CCM notes that pt is not yet ICU level and remains tenuous.  Vitals: While proned, pt SpO2 99% on 60L HHFNC; 100% FiO2 +15L NRB.   Follow Up Recommendations  Outpatient OT;Supervision - Intermittent    Equipment Recommendations  Tub/shower seat    Recommendations for Other Services      Precautions / Restrictions Precautions Precautions: Fall;Other (comment) Precaution Comments: HHFNC+NRB Restrictions Weight Bearing Restrictions: No       Mobility Bed Mobility Overal bed mobility: Needs Assistance             General bed mobility comments: supervision assist to assume prone position  Transfers                      Balance                                           ADL either performed or assessed with clinical judgement   ADL                                          General ADL Comments: Session focused on self proning to improve SpO2 sats. Reviewed energy conservation strategies and how to slowly advance activity appropriately given tenuous respiratory status. Pt requires supervision-min A to complete ADL safety due to marked decrease in activity tolerance/for line management     Vision       Perception     Praxis      Cognition Arousal/Alertness: Awake/alert Behavior During Therapy: WFL for tasks assessed/performed Overall Cognitive Status: Within Functional Limits for tasks assessed Area of Impairment: Safety/judgement;Problem solving                         Safety/Judgement: Decreased awareness of safety;Decreased awareness of deficits   Problem Solving: Slow processing General Comments: has poor awareness of current deficits. States "my breathing is getting better and feels fine" when he was just turned up to [place] +NRB        Exercises     Shoulder Instructions       General Comments  Pertinent Vitals/ Pain       Pain Assessment: No/denies pain  Home Living                                          Prior Functioning/Environment              Frequency  Min 3X/week        Progress Toward Goals  OT Goals(current goals can now be found in the care plan section)  Progress towards OT goals: Not progressing toward goals - comment (limited by inc O2 demands and possible PE)  Acute Rehab OT Goals Patient Stated Goal: return back to ministry OT Goal Formulation: With patient Time For Goal Achievement: 10/02/20 Potential to Achieve Goals: Good  Plan Discharge plan needs to be updated    Co-evaluation                 AM-PAC OT "6 Clicks" Daily Activity     Outcome Measure   Help from another person eating meals?: A Little Help from another person taking care of personal grooming?: A Little Help from another person  toileting, which includes using toliet, bedpan, or urinal?: A Little Help from another person bathing (including washing, rinsing, drying)?: A Little Help from another person to put on and taking off regular upper body clothing?: A Little Help from another person to put on and taking off regular lower body clothing?: A Little 6 Click Score: 18    End of Session Equipment Utilized During Treatment: Oxygen  OT Visit Diagnosis: Other abnormalities of gait and mobility (R26.89)   Activity Tolerance Patient tolerated treatment well   Patient Left in bed;with call bell/phone within reach   Nurse Communication Mobility status        Time: 7948-0165 OT Time Calculation (min): 15 min  Charges: OT General Charges $OT Visit: 1 Visit OT Treatments $Self Care/Home Management : 8-22 mins  Zenovia Jarred, MSOT, OTR/L [facility] Office Number: [phone] Pager: [phone]  Zenovia Jarred 09/24/2020, 6:06 PM

## 2020-09-24 NOTE — Consult Note (Addendum)
NAME:  Javier Peterson, MRN:  [id number], DOB:  1946-07-22, LOS: 6 ADMISSION DATE:  09/10/2020, CONSULTATION DATE:  12/15 REFERRING MD:  Tana Coast, CHIEF COMPLAINT:  Respiratory failure and COVID    Brief History   74 year old nonvaccinated male admitted with Covid pneumonia/ARDS on 12/7.  He is status post systemic steroids, remdesivir, Actemra received on 12/9 and 12/10.  In spite of all therapy remained on heated high flow 100% at 60 L, and nonrebreather mask.  Pulmonary asked to evaluate for potential need for ICU  History of present illness   74 year old nonvaccinated male admitted 12/7 with fever and dyspnea.  Also cough as well as syncopal episode at home.  Hypoxic in the emergency room requiring 8 to 9 L via nasal cannula to keep sats at 90% Covid positive. Admitted to the internal medicine service therapeutic interventions included the following treatment for COVID-19 infection and associated respiratory failure: Supplemental oxygen initially high flow, this is been persistent and also required addition of nonrebreather, active proning position, IV steroids, remdesivir, as well as 2 doses of Actemra received on 12/9 and 12/10. Over the course of his hospitalization he has remained on high flow oxygen, requiring nonrebreather mask as well as high flow oxygen Ranging from 35 L flow now up to 60 L flow, after some difficulty trying to get out of bed this morning.  Because of high oxygen demand, and tenuous respiratory status pulmonary asked to evaluate for possible transfer to the intensive care  Past Medical History  Glaucoma Not vaccinated  Bright Hospital Events   12/7 admitted started on remdesivir and systemic steroids 12/9: First dose of Actemra 12/10: Second dose of Actemra 12/15 remains on high flow oxygen, as well as nonrebreather.  Had episode this morning requiring titration of oxygen up and took approximately < 1 minute to recover because of this pulmonary asked to evaluate  for possible transfer to ICU  Consults:  Critical care consulted 12/15  Procedures:    Significant Diagnostic Tests:  CT angiogram 12/15>>>  Micro Data:  resp panel RT-PCR + COVID  Antimicrobials:  Remdesivir completed on 12/12 Interim history/subjective:  Reports he is comfortable currently Speaking in 5-6 word phrases, does desaturate when he removes nonrebreather but quickly recovers  Objective   Blood pressure (Abnormal) 105/51, pulse 80, temperature 98.1 F (36.7 C), temperature source Axillary, resp. rate (Abnormal) 21, height 6\' 2"  (1.88 m), weight 119.5 kg, SpO2 (Abnormal) 87 %.    FiO2 (%):  [100 %] 100 %   Intake/Output Summary (Last 24 hours) at 09/24/2020 1300 Last data filed at 09/24/2020 0900 Gross per 24 hour  Intake 240 ml  Output 720 ml  Net -480 ml   Filed Weights   09/21/2020 2025 09/18/20 0234  Weight: 122.5 kg 119.5 kg    Examination: General: Otherwise healthy-appearing 74 year old male currently sitting upright in the chair.  He is reclined, has his feet up on bed.  Denies distress, exhibiting no current accessory use HENT: Normocephalic atraumatic mucous membranes moist Lungs: Crackles bilaterally, currently no accessory use, currently 60 L high flow 100%, also nonrebreather.  No clear accessory use at rest, speaking in 5-6 word phrases. Cardiovascular: Regular rate and rhythm without murmur rub or gallop Abdomen: Soft not tender Extremities: Warm dry Neuro: Awake oriented no focal deficits  Resolved Hospital Problem list     Assessment & Plan:  Acute hypoxic respiratory failure in the setting of Covid pneumonia and ARDS -Completed remdesivir -Completed Actemra, last dose 12/10 -Has received  systemic steroids Portable chest x-ray personally reviewed showing diffuse bilateral airspace disease His BNP is slightly elevated, his oxygen requirements are high, certainly agree that ruling out venous thromboembolic event reasonable Plan Continue  supplemental oxygen allowing for pulse oximetry greater than 85% Okay to continue heated high flow and supplemental facemask, would like to avoid intubation at this point now has already has significant acute lung injury and chances of prolonged ventilation and poor outcome very high if intubated Continue to encourage self proning Continue incentive spirometry He is now day #6 of systemic steroids, we can initiate taper soon, does not seem like this is helping much Agree with CT angiogram to rule out pulmonary emboli  We can keep him in the [address] unit for now, he is at high risk for intubation however at this point he rapidly recovers and is in no distress.  Best practice (evaluated daily)   Diet: Regular diet Pain/Anxiety/Delirium protocol (if indicated): Not indicated VAP protocol (if indicated): Not indicated DVT prophylaxis: Enoxaparin GI prophylaxis: Not indicated Glucose control: Not indicated Mobility: Out of bed, encouraging proning last date of multidisciplinary goals of care discussion pending Family and staff present none present Summary of discussion he can stay in 5 [address]. unit for now, we will reassess 12/16.  If gets to a point where he is not recovering rapidly would need transfer to ICU for possible intubation Follow up goals of care discussion due per primary Code Status: Full code Disposition: Keep in current unit, but high risk of worsening respiratory failure  Labs   CBC: Recent Labs  Lab 09/20/20 0256 09/21/20 0454 09/22/20 0103 09/23/20 0525 09/24/20 0103  WBC 10.9* 11.9* 12.8* 15.1* 9.6  NEUTROABS 10.1* 10.9* 11.9* 14.3* 9.1*  HGB 13.7 13.8 14.5 14.1 14.3  HCT 39.4 41.0 41.0 41.1 42.2  MCV 94.3 95.1 94.3 94.7 94.0  PLT 151 144* 146* 185 381    Basic Metabolic Panel: Recent Labs  Lab 09/20/20 0256 09/21/20 0454 09/22/20 0745 09/23/20 0525 09/24/20 0103  NA 137 140 137 139 138  K 3.8 3.8 3.9 4.1 4.3  CL 99 102 102 102 102  CO2 28 27 26 24 25    GLUCOSE 141* 139* 129* 114* 127*  BUN 28* 26* 26* 27* 31*  CREATININE 0.97 0.81 0.83 0.84 0.85  CALCIUM 8.2* 8.2* 8.3* 8.4* 8.3*  MG 2.4 2.4 2.5* 2.6* 2.6*   GFR: Estimated Creatinine Clearance: 106.3 mL/min (by C-G formula based on SCr of 0.85 mg/dL). Recent Labs  Lab 09/21/20 0454 09/22/20 0103 09/22/20 0745 09/23/20 0525 09/24/20 0103  PROCALCITON 0.10  --  <0.10 <0.10 <0.10  WBC 11.9* 12.8*  --  15.1* 9.6    Liver Function Tests: Recent Labs  Lab 09/20/20 0256 09/21/20 0454 09/22/20 0745 09/23/20 0525 09/24/20 0103  AST 175* 247* 151* 110* 97*  ALT 127* 285* 279* 250* 242*  ALKPHOS 61 77 90 98 104  BILITOT 0.8 0.9 1.1 1.1 1.1  PROT 6.5 6.5 6.6 6.1* 6.2*  ALBUMIN 2.9* 2.9* 3.0* 2.9* 2.9*   No results for input(s): LIPASE, AMYLASE in the last 168 hours. No results for input(s): AMMONIA in the last 168 hours.  ABG No results found for: PHART, PCO2ART, PO2ART, HCO3, TCO2, ACIDBASEDEF, O2SAT   Coagulation Profile: Recent Labs  Lab 09/21/20 0454 09/22/20 0745 09/23/20 0525 09/24/20 0103  INR 1.2 1.2 1.2 1.2    Cardiac Enzymes: No results for input(s): CKTOTAL, CKMB, CKMBINDEX, TROPONINI in the last 168 hours.  HbA1C: No results  found for: HGBA1C  CBG: No results for input(s): GLUCAP in the last 168 hours.  Review of Systems:   Review of Systems  Constitutional: Negative.   HENT: Negative.   Eyes: Negative.   Respiratory: Positive for shortness of breath.        Currently thinks breathing is about the same as its been over the last several days symptom burden tolerable.  He said he had a rough morning, had tried to get out of bed with limited support, got tangled up in IV tubing.  Took about a half an hour for him to feel like he had caught his breath after this  Cardiovascular: Negative.   Gastrointestinal: Negative.   Genitourinary: Negative.   Musculoskeletal: Negative.   Skin: Negative.   Neurological: Negative.   Endo/Heme/Allergies:  Negative.   Psychiatric/Behavioral: Negative.      Past Medical History  He,  has a past medical history of Glaucoma, OSA (obstructive sleep apnea), and Skin cancer.   Surgical History    Past Surgical History:  Procedure Laterality Date   VASECTOMY       Social History   reports that he has never smoked. He has never used smokeless tobacco. He reports that he does not drink alcohol and does not use drugs.   Family History   His family history includes Diabetes in his father.   Allergies No Known Allergies   Home Medications  Prior to Admission medications   Medication Sig Start Date End Date Taking? Authorizing Provider  Ascorbic Acid (SUPER C COMPLEX PO) Take 1 tablet by mouth daily.   Yes [provider]  ciprofloxacin (CIPRO) 500 MG tablet Take 500 mg by mouth 2 (two) times daily.   Yes [provider]  dextromethorphan-guaiFENesin (MUCINEX DM) 30-600 MG 12hr tablet Take 1 tablet by mouth 2 (two) times daily as needed for cough.   Yes [provider]  Glucosamine HCl (GLUCOSAMINE PO) Take 1 tablet by mouth daily.   Yes [provider]  latanoprost (XALATAN) 0.005 % ophthalmic solution Place 1 drop into both eyes at bedtime. 08/26/20  Yes [provider]  Multiple Vitamins-Minerals (ZINC PO) Take 1 tablet by mouth daily.   Yes [provider]  VITAMIN D PO Take 1 capsule by mouth daily.   Yes [provider]     Critical care time: 32 min     Erick Colace ACNP-BC [place] Pager # [phone] OR # [phone] if no answer

## 2020-09-25 DIAGNOSIS — R0602 Shortness of breath: Secondary | ICD-10-CM | POA: Diagnosis present

## 2020-09-25 DIAGNOSIS — R55 Syncope and collapse: Secondary | ICD-10-CM | POA: Diagnosis present

## 2020-09-25 LAB — BRAIN NATRIURETIC PEPTIDE: B Natriuretic Peptide: 30.3 pg/mL (ref 0.0–100.0)

## 2020-09-25 LAB — CBC WITH DIFFERENTIAL/PLATELET
Abs Immature Granulocytes: 0.08 10*3/uL — ABNORMAL HIGH (ref 0.00–0.07)
Basophils Absolute: 0 10*3/uL (ref 0.0–0.1)
Basophils Relative: 0 %
Eosinophils Absolute: 0 10*3/uL (ref 0.0–0.5)
Eosinophils Relative: 0 %
HCT: 40.6 % (ref 39.0–52.0)
Hemoglobin: 14.6 g/dL (ref 13.0–17.0)
Immature Granulocytes: 1 %
Lymphocytes Relative: 3 %
Lymphs Abs: 0.2 10*3/uL — ABNORMAL LOW (ref 0.7–4.0)
MCH: 33.5 pg (ref 26.0–34.0)
MCHC: 36 g/dL (ref 30.0–36.0)
MCV: 93.1 fL (ref 80.0–100.0)
Monocytes Absolute: 0.1 10*3/uL (ref 0.1–1.0)
Monocytes Relative: 1 %
Neutro Abs: 7.5 10*3/uL (ref 1.7–7.7)
Neutrophils Relative %: 95 %
Platelets: 167 10*3/uL (ref 150–400)
RBC: 4.36 MIL/uL (ref 4.22–5.81)
RDW: 13.1 % (ref 11.5–15.5)
WBC: 7.9 10*3/uL (ref 4.0–10.5)
nRBC: 0 % (ref 0.0–0.2)

## 2020-09-25 LAB — COMPREHENSIVE METABOLIC PANEL
ALT: 217 U/L — ABNORMAL HIGH (ref 0–44)
AST: 87 U/L — ABNORMAL HIGH (ref 15–41)
Albumin: 2.8 g/dL — ABNORMAL LOW (ref 3.5–5.0)
Alkaline Phosphatase: 120 U/L (ref 38–126)
Anion gap: 11 (ref 5–15)
BUN: 30 mg/dL — ABNORMAL HIGH (ref 8–23)
CO2: 24 mmol/L (ref 22–32)
Calcium: 8.4 mg/dL — ABNORMAL LOW (ref 8.9–10.3)
Chloride: 101 mmol/L (ref 98–111)
Creatinine, Ser: 0.84 mg/dL (ref 0.61–1.24)
GFR, Estimated: >60 mL/min (ref >60)
Glucose, Bld: 114 mg/dL — ABNORMAL HIGH (ref 70–99)
Potassium: 4.7 mmol/L (ref 3.5–5.1)
Sodium: 136 mmol/L (ref 135–145)
Total Bilirubin: 1 mg/dL (ref 0.3–1.2)
Total Protein: 5.9 g/dL — ABNORMAL LOW (ref 6.5–8.1)

## 2020-09-25 LAB — PROTIME-INR
INR: 1.3 — ABNORMAL HIGH (ref 0.8–1.2)
Prothrombin Time: 16 seconds — ABNORMAL HIGH (ref 11.4–15.2)

## 2020-09-25 LAB — D-DIMER, QUANTITATIVE: D-Dimer, Quant: 9.07 ug/mL-FEU — ABNORMAL HIGH (ref 0.00–0.50)

## 2020-09-25 LAB — MAGNESIUM: Magnesium: 2.5 mg/dL — ABNORMAL HIGH (ref 1.7–2.4)

## 2020-09-25 LAB — PROCALCITONIN: Procalcitonin: <0.1 ng/mL

## 2020-09-25 LAB — C-REACTIVE PROTEIN: CRP: 0.5 mg/dL (ref <1.0)

## 2020-09-25 MED ORDER — MELATONIN 3 MG PO TABS
3.0000 mg | ORAL_TABLET | Freq: Every evening | ORAL | Status: DC | PRN
  Administered 2020-09-25 – 2020-09-28 (×2): 3 mg via ORAL
  Filled 2020-09-25 (×3): qty 1

## 2020-09-25 MED ORDER — METHYLPREDNISOLONE SODIUM SUCC 125 MG IJ SOLR
60.0000 mg | INTRAMUSCULAR | Status: DC
  Administered 2020-09-26: 60 mg via INTRAVENOUS
  Filled 2020-09-25: qty 2

## 2020-09-25 NOTE — Progress Notes (Signed)
[facility]                                                                              Patient Demographics  Javier Peterson, is a 74 y.o. male, DOB - [birth year], ERX:[id number]  Admit date - 10/04/2020   Admitting Physician Vernelle Emerald, MD  Outpatient Primary MD for the patient is Patient, No Pcp Per  Outpatient specialists:   LOS - 7  days   Medical records reviewed and are as summarized below:    Chief Complaint  Patient presents with  . Fever       Brief summary   Javier Peterson a 74 y.o.malewith medical history significant ofGlaucoma. Pt presents to ED at [facility] with c/o fever, SOB. Feeling unwell since Thursday, diagnosed with prostatitis on Friday and started cipro on Friday. Dysuria has since resolved but continues to have fever up to 102.6, cough for past 2-3 days, N/V, syncope episode at home.  No leg swelling/pain, no abd pain, not vaccinated to COVID, was admitted with Acute Hypoxic Resp. failure due to Covid Pneumonia.    Assessment & Plan    Principal Problem:  Acute hypoxic respiratory failure due to acute COVID-19 viral pneumonia during the ongoing COVID-19 pandemic- POA - Patient presented with shortness of breath, fevers, coughing, nausea vomiting and syncopal episode at home secondary to COVID-19 pneumonia. -Patient was placed on IV steroids, remdesivir, Actemra, first dose on 09/18/2020, second dose on 09/19/2020.  Despite aggressive treatment, appears to have severe lung injury and continues to have tenuous respiratory status. -CCM consulted, recommended continue supportive care, steroids, consider reducing every 2 to 3 days, full dose anticoagulation progressive mobility - Continue Supportive care: vitamin C/zinc, albuterol, Tylenol. - Continue to wean oxygen, ambulatory O2 screening daily as tolerated -D-dimer trending up, 9.07.  BNP 34.5.  Procalcitonin less than 0.1  - Oxygen - SpO2: 91 % O2 Flow Rate (L/min):  (S) 70 L/min (found on 70 lpm) FiO2 (%): 100 % - Continue to follow labs as below  Lab Results  Component Value Date   SARSCOV2NAA POSITIVE (A) 10/01/2020     Recent Labs  Lab 09/21/20 0454 09/22/20 0745 09/23/20 0525 09/24/20 0103 09/25/20 0308  DDIMER 1.41* 1.87* 2.58* 3.66* 9.07*  CRP 0.8 0.6 0.5 <0.5 0.5  ALT 285* 279* 250* 242* 217*  PROCALCITON 0.10 <0.10 <0.10 <0.10 <0.10    Rising D-dimer -Despite of being on high-dose prophylactic Lovenox.  Doppler ultrasound negative.  - Still hypoxic and extremely tenuous, continue therapeutic Lovenox till D-dimer persistently below 2 -CTA chest pending, continue full dose AC  Mildly worsening transaminitis Likely due to COVID-19 inflammation, recent use of remdesivir, LFTs trending down   Obesity Estimated body mass index is 33.82 kg/m as calculated from the following:   Height as of this encounter: 6\' 2"  (1.88 m).   Weight as of this encounter: 119.5 kg.  Code Status: Full CODE STATUS DVT Prophylaxis:  Lovenox  Family Communication: Discussed all imaging results, lab results, explained to the patient's wife, Judeen Hammans [phone] on 09/24/20     Disposition Plan:     Status is: Inpatient  Remains inpatient  appropriate because:Inpatient level of care appropriate due to severity of illness   Dispo: The patient is from: Home              Anticipated d/c is to: Home              Anticipated d/c date is: 3 days              Patient currently is not medically stable to d/c.  Severely hypoxic, guarded prognosis,      Time Spent in minutes   35 minutes  Procedures:  None  Consultants:   Pulmonary critical care  Antimicrobials:   Anti-infectives (From admission, onward)   Start     Dose/Rate Route Frequency Ordered Stop   09/21/20 1530  remdesivir 100 mg in sodium chloride 0.9 % 100 mL IVPB        100 mg 200 mL/hr over 30 Minutes Intravenous Once 09/21/20 1437 09/21/20 1655   09/18/20 1000  remdesivir 100 mg in  sodium chloride 0.9 % 100 mL IVPB  Status:  Discontinued        100 mg 200 mL/hr over 30 Minutes Intravenous Daily 09/17/20 0010 09/17/20 0908   09/17/20 1000  remdesivir 100 mg in sodium chloride 0.9 % 100 mL IVPB  Status:  Discontinued       "Followed by" Linked Group Details   100 mg 200 mL/hr over 30 Minutes Intravenous Daily 10/01/2020 2340 09/17/20 0009   09/17/20 0908  remdesivir 100 mg in sodium chloride 0.9 % 100 mL IVPB        100 mg 200 mL/hr over 30 Minutes Intravenous Daily 09/17/20 0908 09/21/20 0959   09/17/20 0015  remdesivir 100 mg in sodium chloride 0.9 % 100 mL IVPB        100 mg 200 mL/hr over 30 Minutes Intravenous Every 30 min 09/17/20 0010 09/17/20 0114   10/06/2020 2345  remdesivir 200 mg in sodium chloride 0.9% 250 mL IVPB  Status:  Discontinued       "Followed by" Linked Group Details   200 mg 580 mL/hr over 30 Minutes Intravenous Once 09/14/2020 2340 09/17/20 0009         Medications  Scheduled Meds: . albuterol  2 puff Inhalation TID  . vitamin C  500 mg Oral Daily  . enoxaparin (LOVENOX) injection  120 mg Subcutaneous Q12H  . latanoprost  1 drop Both Eyes QHS  . [START ON 09/26/2020] methylPREDNISolone (SOLU-MEDROL) injection  60 mg Intravenous Q24H  . zinc sulfate  220 mg Oral Daily   Continuous Infusions: PRN Meds:.acetaminophen, albuterol, dextromethorphan-guaiFENesin, guaiFENesin-dextromethorphan, ondansetron (ZOFRAN) IV      Subjective:   Javier Peterson was seen and examined today. Sitting up, still on NRB and HFNC, short of breath on minimal exertion. Patient denies dizziness,  abdominal pain, N/V/D/C, new weakness, numbess, tingling. No acute events overnight.    Objective:   Vitals:   09/25/20 0422 09/25/20 0500 09/25/20 0705 09/25/20 0832  BP:  116/64    Pulse: 76 74  89  Resp: (!) 23  (!) 24 (!) 22  Temp:  98.3 F (36.8 C)    TempSrc:  Axillary    SpO2:  96%  91%  Weight:      Height:        Intake/Output Summary (Last 24  hours) at 09/25/2020 1434 Last data filed at 09/25/2020 0710 Gross per 24 hour  Intake 220 ml  Output 850 ml  Net -630 ml  Wt Readings from Last 3 Encounters:  09/18/20 119.5 kg  05/22/17 124.3 kg  04/06/17 126.6 kg   Physical Exam   General: Alert and oriented x 3, mild to mod respiratory distress   Cardiovascular: S1 S2 clear, RRR. No pedal edema b/l  Respiratory: dec BS at bases   Gastrointestinal: Soft, nontender, nondistended, NBS  Ext: no pedal edema bilaterally  Neuro: no new deficits  Musculoskeletal: No cyanosis, clubbing  Skin: No rashes  Psych: Normal affect and demeanor, alert and oriented x3     Data Reviewed:  I have personally reviewed following labs and imaging studies  Micro Results Recent Results (from the past 240 hour(s))  Resp Panel by RT-PCR (Flu A&B, Covid) Nasopharyngeal Swab     Status: Abnormal   Collection Time: 09/11/2020  8:28 PM   Specimen: Nasopharyngeal Swab; Nasopharyngeal(NP) swabs in vial transport medium  Result Value Ref Range Status   SARS Coronavirus 2 by RT PCR POSITIVE (A) NEGATIVE Final    Comment: RESULT CALLED TO, READ BACK BY AND VERIFIED WITH: ADKINS,L AT 2116 ON [id number] BY CHERESNOWSKY,T (NOTE) SARS-CoV-2 target nucleic acids are DETECTED.  The SARS-CoV-2 RNA is generally detectable in upper respiratory specimens during the acute phase of infection. Positive results are indicative of the presence of the identified virus, but do not rule out bacterial infection or co-infection with other pathogens not detected by the test. Clinical correlation with patient history and other diagnostic information is necessary to determine patient infection status. The expected result is Negative.  Fact Sheet for Patients: EntrepreneurPulse.com.au  Fact Sheet for Healthcare Providers: IncredibleEmployment.be  This test is not yet approved or cleared by the [place] FDA and  has been  authorized for detection and/or diagnosis of SARS-CoV-2 by FDA under an Emergency Use Authorization (EUA).  This EUA will remain in effect (meaning this  test can be used) for the duration of  the COVID-19 declaration under Section 564(b)(1) of the Act, 21 U.S.C. section 360bbb-3(b)(1), unless the authorization is terminated or revoked sooner.     Influenza A by PCR NEGATIVE NEGATIVE Final   Influenza B by PCR NEGATIVE NEGATIVE Final    Comment: (NOTE) The Xpert Xpress SARS-CoV-2/FLU/RSV plus assay is intended as an aid in the diagnosis of influenza from Nasopharyngeal swab specimens and should not be used as a sole basis for treatment. Nasal washings and aspirates are unacceptable for Xpert Xpress SARS-CoV-2/FLU/RSV testing.  Fact Sheet for Patients: EntrepreneurPulse.com.au  Fact Sheet for Healthcare Providers: IncredibleEmployment.be  This test is not yet approved or cleared by the [place] FDA and has been authorized for detection and/or diagnosis of SARS-CoV-2 by FDA under an Emergency Use Authorization (EUA). This EUA will remain in effect (meaning this test can be used) for the duration of the COVID-19 declaration under Section 564(b)(1) of the Act, 21 U.S.C. section 360bbb-3(b)(1), unless the authorization is terminated or revoked.  Performed at [facility], [address] [place], [place] [address]   Culture, blood (Routine x 2)     Status: None   Collection Time: 09/11/2020  8:43 PM   Specimen: BLOOD  Result Value Ref Range Status   Specimen Description   Final    BLOOD BLOOD RIGHT FOREARM Performed at [facility], [address] [place], [place] [address]    Special Requests   Final    BOTTLES DRAWN AEROBIC AND ANAEROBIC Blood Culture adequate volume Performed at [facility], [address],  [place], [place] [address]    Culture   Final    NO GROWTH 5 DAYS Performed at [facility], [place] [address] [place], [place] [address]    Report Status 09/21/2020 FINAL  Final  Culture, blood (Routine x 2)     Status: None   Collection Time: 09/13/2020  9:09 PM   Specimen: BLOOD  Result Value Ref Range Status   Specimen Description   Final    BLOOD BLOOD LEFT HAND Performed at [facility], [address] [place], [place] [address]    Special Requests   Final    BOTTLES DRAWN AEROBIC AND ANAEROBIC Blood Culture adequate volume Performed at [facility], [address] [place], [place] [address]    Culture   Final    NO GROWTH 5 DAYS Performed at [facility], [place] [address] [place], [place] [address]    Report Status 09/21/2020 FINAL  Final  MRSA PCR Screening     Status: None   Collection Time: 09/18/20  7:51 AM   Specimen: Nasopharyngeal  Result Value Ref Range Status   MRSA by PCR NEGATIVE NEGATIVE Final    Comment:        The GeneXpert MRSA Assay (FDA approved for NASAL specimens only), is one component of a comprehensive MRSA colonization surveillance program. It is not intended to diagnose MRSA infection nor to guide or monitor treatment for MRSA infections. Performed at [facility], [place] [address], [place], [place] [address]     Radiology Reports DG Chest [place] 1 View  Result Date: 09/23/2020 CLINICAL DATA:  Shortness of breath.  COVID-19 virus infection. EXAM: PORTABLE CHEST 1 VIEW COMPARISON:  09/21/2020 FINDINGS: Heart size remains stable. Previously seen subtle areas of heterogeneous opacity in the lung bases is seen have improved. No new or worsening areas of pulmonary opacity are seen. No evidence of pleural effusion. IMPRESSION: Interval improvement in subtle bibasilar areas of heterogeneous opacity. Stable cardiomegaly. Electronically Signed   By: Marlaine Hind M.D.   On: 09/23/2020 09:00   DG Chest Port 1 View  Result Date: 09/21/2020 CLINICAL DATA:  Shortness of breath. EXAM: PORTABLE CHEST 1 VIEW  COMPARISON:  September 19, 2020. FINDINGS: Stable cardiomegaly. No pneumothorax or pleural effusion is noted. Mild bibasilar opacities are noted concerning for infiltrates and multifocal pneumonia. Bony thorax is unremarkable. IMPRESSION: Mild bibasilar opacities are noted concerning for multifocal pneumonia. Electronically Signed   By: Marijo Conception M.D.   On: 09/21/2020 09:53   DG Chest Port 1 View  Result Date: 09/19/2020 CLINICAL DATA:  Shortness of breath.  COVID positive. EXAM: PORTABLE CHEST 1 VIEW COMPARISON:  September 16, 2020. FINDINGS: Increased conspicuity of hazy and interstitial opacities involving the left greater than right lung bases. No visible pleural effusions or pneumothorax. Biapical pleuroparenchymal scarring. Mild cardiac enlargement. IMPRESSION: Increased conspicuity of hazy and interstitial opacities involving the left greater than right lung bases, concerning for multifocal pneumonia in the setting of reported COVID positivity. Electronically Signed   By: Margaretha Sheffield MD   On: 09/19/2020 08:56   DG Chest Portable 1 View  Result Date: 10/07/2020 CLINICAL DATA:  Fever EXAM: PORTABLE CHEST 1 VIEW COMPARISON:  None. FINDINGS: There are hazy bilateral airspace opacities. The heart size is mildly enlarged. There is no pneumothorax or large pleural effusion. No acute osseous abnormality. IMPRESSION: Hazy bilateral airspace opacities concerning for multifocal pneumonia (viral or bacterial). Electronically Signed  By: Constance Holster M.D.   On: 10/08/2020 21:13   VAS [place] LOWER EXTREMITY VENOUS (DVT)  Result Date: 09/22/2020  Lower Venous DVT Study Other Indications: Covid, D-Dimer. Comparison Study: No previous exam Performing Technologist: Vonzell Schlatter RVT  Examination Guidelines: A complete evaluation includes B-mode imaging, spectral Doppler, color Doppler, and power Doppler as needed of all accessible portions of each vessel. Bilateral testing is considered an integral part  of a complete examination. Limited examinations for reoccurring indications may be performed as noted. The reflux portion of the exam is performed with the patient in reverse Trendelenburg.  +---------+---------------+---------+-----------+----------+--------------+ RIGHT    CompressibilityPhasicitySpontaneityPropertiesThrombus Aging +---------+---------------+---------+-----------+----------+--------------+ CFV      Full           Yes      Yes                                 +---------+---------------+---------+-----------+----------+--------------+ SFJ      Full                                                        +---------+---------------+---------+-----------+----------+--------------+ FV Prox  Full                                                        +---------+---------------+---------+-----------+----------+--------------+ FV Mid   Full                                                        +---------+---------------+---------+-----------+----------+--------------+ FV DistalFull                                                        +---------+---------------+---------+-----------+----------+--------------+ PFV      Full                                                        +---------+---------------+---------+-----------+----------+--------------+ POP      Full           Yes      Yes                                 +---------+---------------+---------+-----------+----------+--------------+ PTV      Full                                                        +---------+---------------+---------+-----------+----------+--------------+ PERO     Full                                                        +---------+---------------+---------+-----------+----------+--------------+   +---------+---------------+---------+-----------+----------+--------------+  LEFT     CompressibilityPhasicitySpontaneityPropertiesThrombus Aging  +---------+---------------+---------+-----------+----------+--------------+ CFV      Full           Yes      Yes                                 +---------+---------------+---------+-----------+----------+--------------+ SFJ      Full                                                        +---------+---------------+---------+-----------+----------+--------------+ FV Prox  Full                                                        +---------+---------------+---------+-----------+----------+--------------+ FV Mid   Full                                                        +---------+---------------+---------+-----------+----------+--------------+ FV DistalFull                                                        +---------+---------------+---------+-----------+----------+--------------+ PFV      Full                                                        +---------+---------------+---------+-----------+----------+--------------+ POP      Full           Yes      Yes                                 +---------+---------------+---------+-----------+----------+--------------+ PTV      Full                                                        +---------+---------------+---------+-----------+----------+--------------+ PERO     Full                                                        +---------+---------------+---------+-----------+----------+--------------+     Summary: RIGHT: - There is no evidence of deep vein thrombosis in the lower extremity.  - No cystic structure found in the popliteal fossa.  LEFT: - There is no evidence of deep vein thrombosis in the lower extremity.  - No  cystic structure found in the popliteal fossa.  *See table(s) above for measurements and observations. Electronically signed by Ruta Hinds MD on 09/22/2020 at 7:47:29 PM.    Final     Lab Data:  CBC: Recent Labs  Lab 09/21/20 0454 09/22/20 0103 09/23/20 0525  09/24/20 0103 09/25/20 0308  WBC 11.9* 12.8* 15.1* 9.6 7.9  NEUTROABS 10.9* 11.9* 14.3* 9.1* 7.5  HGB 13.8 14.5 14.1 14.3 14.6  HCT 41.0 41.0 41.1 42.2 40.6  MCV 95.1 94.3 94.7 94.0 93.1  PLT 144* 146* 185 184 761   Basic Metabolic Panel: Recent Labs  Lab 09/21/20 0454 09/22/20 0745 09/23/20 0525 09/24/20 0103 09/25/20 0308  NA 140 137 139 138 136  K 3.8 3.9 4.1 4.3 4.7  CL 102 102 102 102 101  CO2 27 26 24 25 24   GLUCOSE 139* 129* 114* 127* 114*  BUN 26* 26* 27* 31* 30*  CREATININE 0.81 0.83 0.84 0.85 0.84  CALCIUM 8.2* 8.3* 8.4* 8.3* 8.4*  MG 2.4 2.5* 2.6* 2.6* 2.5*   GFR: Estimated Creatinine Clearance: 107.6 mL/min (by C-G formula based on SCr of 0.84 mg/dL). Liver Function Tests: Recent Labs  Lab 09/21/20 0454 09/22/20 0745 09/23/20 0525 09/24/20 0103 09/25/20 0308  AST 247* 151* 110* 97* 87*  ALT 285* 279* 250* 242* 217*  ALKPHOS 77 90 98 104 120  BILITOT 0.9 1.1 1.1 1.1 1.0  PROT 6.5 6.6 6.1* 6.2* 5.9*  ALBUMIN 2.9* 3.0* 2.9* 2.9* 2.8*   No results for input(s): LIPASE, AMYLASE in the last 168 hours. No results for input(s): AMMONIA in the last 168 hours. Coagulation Profile: Recent Labs  Lab 09/21/20 0454 09/22/20 0745 09/23/20 0525 09/24/20 0103 09/25/20 0308  INR 1.2 1.2 1.2 1.2 1.3*   Cardiac Enzymes: No results for input(s): CKTOTAL, CKMB, CKMBINDEX, TROPONINI in the last 168 hours. BNP (last 3 results) No results for input(s): PROBNP in the last 8760 hours. HbA1C: No results for input(s): HGBA1C in the last 72 hours. CBG: No results for input(s): GLUCAP in the last 168 hours. Lipid Profile: No results for input(s): CHOL, HDL, LDLCALC, TRIG, CHOLHDL, LDLDIRECT in the last 72 hours. Thyroid Function Tests: No results for input(s): TSH, T4TOTAL, FREET4, T3FREE, THYROIDAB in the last 72 hours. Anemia Panel: No results for input(s): VITAMINB12, FOLATE, FERRITIN, TIBC, IRON, RETICCTPCT in the last 72 hours. Urine analysis:    Component  Value Date/Time   COLORURINE YELLOW 09/17/2020 0011   APPEARANCEUR CLEAR 09/17/2020 0011   LABSPEC >1.030 (H) 09/17/2020 0011   PHURINE 5.5 09/17/2020 0011   GLUCOSEU NEGATIVE 09/17/2020 0011   HGBUR TRACE (A) 09/17/2020 0011   BILIRUBINUR NEGATIVE 09/17/2020 0011   KETONESUR NEGATIVE 09/17/2020 0011   PROTEINUR 100 (A) 09/17/2020 0011   NITRITE NEGATIVE 09/17/2020 0011   LEUKOCYTESUR NEGATIVE 09/17/2020 0011     Zebulan Hinshaw M.D. [facility] 09/25/2020, 2:34 PM   Call night coverage person covering after 7pm

## 2020-09-25 NOTE — Progress Notes (Signed)
Occupational Therapy Treatment Patient Details Name: Javier Peterson MRN: [id number] DOB: 1946/09/13 Today's Date: 09/25/2020    History of present illness 74 y.o. male with medical history significant of glaucoma.  Pt presents to ED 09/27/2020 at [facility] with c/o fever, SOB; diagnosed with prostatitis 12/3 and started cipro with dysuria resolved but continues to have fever up to 102.6, cough, N/V, and syncope episode at home. +COVID (12/7); not vaccinated   OT comments  Pt seen for OT follow up session with focus on ADL mobility progression and energy conservation skill education. Pt on 60L HHFNc+15L NRB on OT arrival with SpO2 ~93% at rest sitting up in recliner. Pt completed x3 sit <> stands with marching in place ~30 secs. With this level of activity, pt SpO2 as low as 82%. He was able to recover in <2 mins with min cues for pursed lip breathing and seated rest break. Completed IS and flutter training. D/c recs are most appropriate for HHOT at this time, but will continue to follow as pt progresses per POC listed below.  Vitals: 60L HHFNC+15L NRB with SpO2 ~82% with limited mobility. Pt requires <31mins to recover back to low 90s.   Follow Up Recommendations  Home health OT;Supervision - Intermittent    Equipment Recommendations  Tub/shower seat    Recommendations for Other Services      Precautions / Restrictions Precautions Precautions: Fall;Other (comment) Precaution Comments: HHFNC+NRB Restrictions Weight Bearing Restrictions: No       Mobility Bed Mobility               General bed mobility comments: up in chair, returned to chair  Transfers Overall transfer level: Needs assistance Equipment used: None Transfers: Sit to/from Stand Sit to Stand: Supervision         General transfer comment: x3 sit <> stands in session    Balance Overall balance assessment: Mild deficits observed, not formally tested                                          ADL either performed or assessed with clinical judgement   ADL                                         General ADL Comments: Session focused on ADL mobility progression to improve activity tolerance. Reviewed energy conservation strategies as they apply to ADL. Pt reports he was able to recall these methods and apply them to morinng bath with RN staff this AM. Pt practiced pursed lip breathing during mobilization and was instructed on how to read SpO2 levels. Pt able to transfer with supervision level assist with safety for line management     Vision       Perception     Praxis      Cognition Arousal/Alertness: Awake/alert Behavior During Therapy: [facility] for tasks assessed/performed Overall Cognitive Status: Impaired/Different from baseline Area of Impairment: Safety/judgement;Problem solving                         Safety/Judgement: Decreased awareness of safety;Decreased awareness of deficits   Problem Solving: Slow processing General Comments: continues to have poor awareness of current nature of situation. Requires cues and increased time to process basic commands  Exercises Other Exercises Other Exercises: Pt performed sit <> stands x3 with marching in place for ~30s each stand Other Exercises: IS training: pulling ~1500 mL with good technique   Shoulder Instructions       General Comments      Pertinent Vitals/ Pain       Pain Assessment: No/denies pain  Home Living                                          Prior Functioning/Environment              Frequency  Min 3X/week        Progress Toward Goals  OT Goals(current goals can now be found in the care plan section)  Progress towards OT goals: Progressing toward goals  Acute Rehab OT Goals Patient Stated Goal: return back to ministry OT Goal Formulation: With patient Time For Goal Achievement: 10/02/20 Potential to Achieve Goals: Good  Plan  Discharge plan needs to be updated    Co-evaluation                 AM-PAC OT "6 Clicks" Daily Activity     Outcome Measure   Help from another person eating meals?: A Little Help from another person taking care of personal grooming?: A Little Help from another person toileting, which includes using toliet, bedpan, or urinal?: A Little Help from another person bathing (including washing, rinsing, drying)?: A Little Help from another person to put on and taking off regular upper body clothing?: A Little Help from another person to put on and taking off regular lower body clothing?: A Little 6 Click Score: 18    End of Session Equipment Utilized During Treatment: Oxygen (HHFC+NRB)  OT Visit Diagnosis: Other abnormalities of gait and mobility (R26.89)   Activity Tolerance Patient tolerated treatment well   Patient Left in chair;with call bell/phone within reach   Nurse Communication Mobility status        Time: 0388-8280 OT Time Calculation (min): 21 min  Charges: OT General Charges $OT Visit: 1 Visit OT Treatments $Therapeutic Activity: 8-22 mins  Zenovia Jarred, MSOT, OTR/L [facility] Office Number: [phone] Pager: [phone]   Zenovia Jarred 09/25/2020, 6:20 PM

## 2020-09-25 NOTE — Plan of Care (Signed)
°  Problem: Coping: Goal: Psychosocial and spiritual needs will be supported Outcome: Progressing   Problem: Respiratory: Goal: Will maintain a patent airway Outcome: Progressing Goal: Complications related to the disease process, condition or treatment will be avoided or minimized Outcome: Progressing   Problem: Safety: Goal: Ability to remain free from injury will improve Outcome: Progressing   Problem: Skin Integrity: Goal: Risk for impaired skin integrity will decrease Outcome: Progressing

## 2020-09-25 NOTE — Progress Notes (Signed)
09/25/2020 Pulmonary Note  I have seen and evaluated the patient for COVID ARDS.  S:  Remains stable, speaking in full sentences, about to get a bath and get up to chair.  O: Blood pressure 116/64, pulse 89, temperature 98.3 F (36.8 C), temperature source Axillary, resp. rate (!) 22, height 6\' 2"  (1.88 m), weight 119.5 kg, SpO2 91 %.  Overweight man in NAD Lungs suprisingly clear, no accessory muscle use No peripheral edema  No new imaging D dimer going up  A:  COVID ARDS Rising D dimer, on full dose AC  P:  - Continue supportive care, steroids (dropped to 60mg  qday, consider reducing every 2-3 days), full dose AC, progressive mobility, incentive spirometry, proning as able - Decision to intubate should be based on clinical gestalt, trajectory of symptoms, and mental status rather than O2 sats alone - PCCM will be available PRN, he does remain high risk for progressive respiratory failure but stable  09/25/2020 Erskine Emery MD

## 2020-09-25 NOTE — TOC Initial Note (Signed)
Transition of Care [facility]) - Initial/Assessment Note    Patient Details  Name: Javier Peterson MRN: [id number] Date of Birth: [birth year]  Transition of [facility]) CM/SW Contact:    Verdell Carmine, RN Phone Number: 09/25/2020, 11:32 AM  Clinical Narrative:                 Day 8  Post COVID ARDS, on 100% NRB was on 60L had a period of desaturation yesterday, evaluated by Critical care for possible transfer. To ICU, however is doing better, self proning, May still need ICU care if has any more desaturations. PT and OT evals reveal pot OP PT. Patient keeping strength up. Will likely need home oxygen and [place] Patient has [employer]. CM will follow for needs.   Expected Discharge Plan: [place]     Patient Goals and CMS Choice        Expected Discharge Plan and Services Expected Discharge Plan: [place]   Discharge Planning Services: CM Consult   Living arrangements for the past 2 months: Single Family Home                                      Prior Living Arrangements/Services Living arrangements for the past 2 months: Single Family Home Lives with:: Spouse Patient language and need for interpreter reviewed:: Yes        Need for Family Participation in Patient Care: Yes (Comment) Care giver support system in place?: Yes (comment)   Criminal Activity/Legal Involvement Pertinent to Current Situation/Hospitalization: No - Comment as needed  Activities of Daily Living Home Assistive Devices/Equipment: None ADL Screening (condition at time of admission) Patient's cognitive ability adequate to safely complete daily activities?: Yes Is the patient deaf or have difficulty hearing?: No Does the patient have difficulty seeing, even when wearing glasses/contacts?: No Does the patient have difficulty concentrating, remembering, or making decisions?: No Patient able to express need for assistance with ADLs?: Yes Does the patient  have difficulty dressing or bathing?: No Independently performs ADLs?: Yes (appropriate for developmental age) Does the patient have difficulty walking or climbing stairs?: No Weakness of Legs: Both Weakness of Arms/Hands: None  Permission Sought/Granted                  Emotional Assessment       Orientation: : Oriented to Self,Oriented to Place,Oriented to  Time,Oriented to Situation Alcohol / Substance Use: Not Applicable Psych Involvement: No (comment)  Admission diagnosis:  Syncope, unspecified syncope type [R55] COVID-19 virus infection [U07.1] Pneumonia due to COVID-19 virus [U07.1, J12.82] Patient Active Problem List   Diagnosis Date Noted  . Acute hypoxemic respiratory failure due to COVID-19 ([place]) 09/24/2020   PCP:  Patient, No Pcp Per Pharmacy:   [id number] - [place], [place]. AT [address] [place] [address] Phone: [phone] Fax: [phone]     Social Determinants of Health (SDOH) Interventions    Readmission Risk Interventions No flowsheet data found.

## 2020-09-26 ENCOUNTER — Inpatient Hospital Stay (HOSPITAL_COMMUNITY): Payer: Medicare HMO

## 2020-09-26 DIAGNOSIS — R7989 Other specified abnormal findings of blood chemistry: Secondary | ICD-10-CM

## 2020-09-26 DIAGNOSIS — U071 COVID-19: Secondary | ICD-10-CM

## 2020-09-26 LAB — D-DIMER, QUANTITATIVE: D-Dimer, Quant: 17.43 ug/mL-FEU — ABNORMAL HIGH (ref 0.00–0.50)

## 2020-09-26 LAB — COMPREHENSIVE METABOLIC PANEL
ALT: 186 U/L — ABNORMAL HIGH (ref 0–44)
AST: 81 U/L — ABNORMAL HIGH (ref 15–41)
Albumin: 3 g/dL — ABNORMAL LOW (ref 3.5–5.0)
Alkaline Phosphatase: 143 U/L — ABNORMAL HIGH (ref 38–126)
Anion gap: 13 (ref 5–15)
BUN: 28 mg/dL — ABNORMAL HIGH (ref 8–23)
CO2: 25 mmol/L (ref 22–32)
Calcium: 8.5 mg/dL — ABNORMAL LOW (ref 8.9–10.3)
Chloride: 99 mmol/L (ref 98–111)
Creatinine, Ser: 0.84 mg/dL (ref 0.61–1.24)
GFR, Estimated: >60 mL/min (ref >60)
Glucose, Bld: 98 mg/dL (ref 70–99)
Potassium: 4.4 mmol/L (ref 3.5–5.1)
Sodium: 137 mmol/L (ref 135–145)
Total Bilirubin: 0.8 mg/dL (ref 0.3–1.2)
Total Protein: 6.3 g/dL — ABNORMAL LOW (ref 6.5–8.1)

## 2020-09-26 LAB — GLUCOSE, CAPILLARY
Glucose-Capillary: 128 mg/dL — ABNORMAL HIGH (ref 70–99)
Glucose-Capillary: 88 mg/dL (ref 70–99)
Glucose-Capillary: 95 mg/dL (ref 70–99)
Glucose-Capillary: 96 mg/dL (ref 70–99)

## 2020-09-26 LAB — BRAIN NATRIURETIC PEPTIDE: B Natriuretic Peptide: 35.2 pg/mL (ref 0.0–100.0)

## 2020-09-26 LAB — CBC
HCT: 44 % (ref 39.0–52.0)
Hemoglobin: 15.8 g/dL (ref 13.0–17.0)
MCH: 33.3 pg (ref 26.0–34.0)
MCHC: 35.9 g/dL (ref 30.0–36.0)
MCV: 92.6 fL (ref 80.0–100.0)
Platelets: 111 10*3/uL — ABNORMAL LOW (ref 150–400)
RBC: 4.75 MIL/uL (ref 4.22–5.81)
RDW: 13.4 % (ref 11.5–15.5)
WBC: 13.1 10*3/uL — ABNORMAL HIGH (ref 4.0–10.5)
nRBC: 0 % (ref 0.0–0.2)

## 2020-09-26 LAB — C-REACTIVE PROTEIN: CRP: <0.5 mg/dL (ref <1.0)

## 2020-09-26 MED ORDER — METHYLPREDNISOLONE SODIUM SUCC 40 MG IJ SOLR
40.0000 mg | INTRAMUSCULAR | Status: DC
  Administered 2020-09-27 – 2020-10-04 (×8): 40 mg via INTRAVENOUS
  Filled 2020-09-26 (×8): qty 1

## 2020-09-26 MED ORDER — CHLORHEXIDINE GLUCONATE CLOTH 2 % EX PADS
6.0000 | MEDICATED_PAD | Freq: Every day | CUTANEOUS | Status: DC
  Administered 2020-09-26 – 2020-10-08 (×12): 6 via TOPICAL

## 2020-09-26 MED ORDER — CHLORHEXIDINE GLUCONATE CLOTH 2 % EX PADS: 6.0000 | MEDICATED_PAD | Freq: Every day | CUTANEOUS | Status: DC

## 2020-09-26 NOTE — Plan of Care (Signed)
D/w Dr. Vaughan Browner, PCCM will assume care while patient is in ICU.     Estill Cotta M.D.  [facility] 09/26/2020, 8:13 AM

## 2020-09-26 NOTE — Progress Notes (Signed)
NAME:  Javier Peterson, MRN:  [id number], DOB:  Feb 22, 1946, LOS: 8 ADMISSION DATE:  09/22/2020, CONSULTATION DATE:  12/15 REFERRING MD:  Tana Coast, CHIEF COMPLAINT:  Respiratory failure and COVID    Brief History   74 year old nonvaccinated male admitted with Covid pneumonia/ARDS on 12/7.  He is status post systemic steroids, remdesivir, Actemra received on 12/9 and 12/10.  In spite of all therapy remained on heated high flow 100% at 60 L, and nonrebreather mask.  Pulmonary asked to evaluate for potential need for ICU  History of present illness   74 year old nonvaccinated male admitted 12/7 with fever and dyspnea.  Also cough as well as syncopal episode at home.  Hypoxic in the emergency room requiring 8 to 9 L via nasal cannula to keep sats at 90% Covid positive. Admitted to the internal medicine service therapeutic interventions included the following treatment for COVID-19 infection and associated respiratory failure: Supplemental oxygen initially high flow, this is been persistent and also required addition of nonrebreather, active proning position, IV steroids, remdesivir, as well as 2 doses of Actemra received on 12/9 and 12/10. Over the course of his hospitalization he has remained on high flow oxygen, requiring nonrebreather mask as well as high flow oxygen Ranging from 35 L flow now up to 60 L flow, after some difficulty trying to get out of bed this morning.  Because of high oxygen demand, and tenuous respiratory status pulmonary asked to evaluate for possible transfer to the intensive care  Past Medical History  Glaucoma Not vaccinated  [place] [facility] Events   12/7 admitted started on remdesivir and systemic steroids 12/9: First dose of Actemra 12/10: Second dose of Actemra 12/15 remains on high flow oxygen, as well as nonrebreather.  Had episode this morning requiring titration of oxygen up and took approximately < 1 minute to recover because of this pulmonary asked to evaluate  for possible transfer to ICU 12/16 looked okay initially, critical care had signed off 12/17: Rapid response called, patient reporting worsening work of breathing, oxygen titrated up to 60 L heated high flow with 100% pulse ox initially in 81 range.  Moved to ICU placed on BiPAP.  Work of breathing improved, pulse oximetry stabilized.  Critical care assuming primary care Consults:  Critical care consulted 12/15  Procedures:    Significant Diagnostic Tests:  CT angiogram 12/15>>>not done LE [place] 12/11: neg LE [place] 12/17>>>  Micro Data:  resp panel RT-PCR + COVID  Antimicrobials:  Remdesivir completed on 12/12 Interim history/subjective:  Clinically much worse, off from BiPAP only able to speak 2-3 word phrases now  Objective   Blood pressure 108/65, pulse 92, temperature 99.1 F (37.3 C), temperature source Axillary, resp. rate (Abnormal) 29, height 6\' 2"  (1.88 m), weight 119.5 kg, SpO2 (Abnormal) 79 %.    FiO2 (%):  [100 %] 100 %   Intake/Output Summary (Last 24 hours) at 09/26/2020 1006 Last data filed at 09/26/2020 0600 Gross per 24 hour  Intake 720 ml  Output 350 ml  Net 370 ml   Filed Weights   10/08/2020 2025 09/18/20 0234  Weight: 122.5 kg 119.5 kg    Examination: General: 74 year old white male currently on BiPAP, no acute distress, when removed from BiPAP however does have significant accessory use, now really only to speak in 1-2 word phrases whereas 48 hours ago can speak in several word phrases HEENT normocephalic atraumatic no JVD sclera nonicteric Pulmonary: Diminished throughout, crackles posteriorly, accessory use noted off BiPAP.  Back on 100% BiPAP,  Cardiac: Regular rate and rhythm without murmur rub or gallop Abdomen: Soft nontender no organomegaly Extremities: Warm dry brisk cap refill Neuro awake oriented no focal deficits.  Resolved Hospital Problem list     Assessment & Plan:  Acute hypoxic respiratory failure in the setting of Covid pneumonia and  ARDS -Completed remdesivir -Completed Actemra, last dose 12/10 -Has received systemic steroids Portable chest x-ray with persistent diffuse bilateral airspace disease We have not been able to get CT angiogram Ongoing discussions about goals of care at bedside Plan Continue supplemental oxygen with high flow mask as well as heated high flow We will alternate BiPAP, should be mandatory at at bedtime and as needed during the daytime  As his D-dimer is elevated would continue therapeutic dose low molecular weight heparin, will repeat lower extremity ultrasounds.  On 12/13 were negative  Day #8 systemic steroids, these of not been effective in improving his status will change to 40 daily Have spoken with the patient in regards to goals of care, have nothing else to add other than time and possible mechanical ventilation.  Do not think he will do well on ventilator  Best practice (evaluated daily)   Diet: Regular diet Pain/Anxiety/Delirium protocol (if indicated): Not indicated VAP protocol (if indicated): Not indicated DVT prophylaxis: Enoxaparin, now on therapeutic dosing GI prophylaxis: Not indicated Glucose control: Not indicated Mobility: Out of bed, encouraging proning last date of multidisciplinary goals of care discussion pending Family and staff present none present Summary of discussion he is clinically worse introduced the idea of DNR and ventilation again.  He wanted time to speak with his wife, I will reach out to her as well  Goals of care discussion: Multidisciplinary goals of care with conducted at bedside including nurse practitioner and RN with the patient on 12/17.  Currently patient indicates he would not want mechanical ventilation but needed time to speak with his wife in regards to if this would mean keeping him alive or not.  He indicates he would not want ventilation if it meant he could not return to meaningful recovery however he needed some time to discuss with family  whether or not this would equate to DO NOT RESUSCITATE and DO NOT INTUBATE Code Status: Full code Disposition: Keep in the intensive care, planning on revisiting goals of care again later this afternoon, will also speak to spouse  My cct 13 minutes  Erick Colace ACNP-BC [place] Pager # [phone] OR # [phone] if no answer

## 2020-09-26 NOTE — Progress Notes (Signed)
RT NOTES: Removed patient from bipap and placed back on HHFNC 45L/100% with NRB mask.

## 2020-09-26 NOTE — Progress Notes (Signed)
Cross-coverage note:   Patient seen for increased WOB.   Brief HPI: 78 yom w/ hx of glaucoma, generally healthy but not vaccinated against COVID, admitted 9 days ago with acute hypoxic resp failure d/t COVID-19, treated with remdesivir, systemic steroids, and Actemra on 12/9 and 12/10, requiring 60 Lpm HHNC + NRB for two days now.   S: Denies chest pain. Reports "having a rough night." Feeling tired, scared.   O: HR low 100s; RR ~20; SBP 125; O2 sat low 90s initially, low 80s after speaking a couple words.   No diaphoresis, no pallor or cyanosis, but using accessory muscles. Breath sounds diminished bilaterally. No leg swelling or tenderness.   A&P: Slowing worsening resp failure, new complaints of fatigue and anxiety concerning for impending decompensation. Appreciate PCCM involvement, planning to transfer to ICU for ongoing care.

## 2020-09-26 NOTE — Progress Notes (Signed)
Goals of care discussion   Spoke at length with the patient and his wife Judeen Hammans. At this point Mr Hinderman is afraid of death. He understands that if things progress to him needing ventilator the chances of coming off vent are poor at best. In spite of this, should he get worse he would want to be placed on vent. He and his wife request that should this happen we continue aggressive care for 5-7 days and see how he progresses. If he shows improvement we would continue on-going support, if he were to decline we would consider then when to transition to comfort.  Plan Full code.  5-7 day course of ventilation if gets worse Would offer pressors If declines to point needed HD family would be open to consideration of transition to comfort.   Erick Colace ACNP-BC [place] Pager # [phone] OR # [phone] if no answer

## 2020-09-26 NOTE — Consult Note (Addendum)
NAME:  Javier Peterson, MRN:  [id number], DOB:  Jan 21, 1946, LOS: 8 ADMISSION DATE:  09/15/2020, CONSULTATION DATE:  12/15 REFERRING MD:  Tana Coast, CHIEF COMPLAINT:  Respiratory failure and COVID    Brief History   74 year old nonvaccinated male admitted with Covid pneumonia/ARDS on 12/7.  He is status post systemic steroids, remdesivir, Actemra received on 12/9 and 12/10.  In spite of all therapy remained on heated high flow 100% at 60 L, and nonrebreather mask.  Pulmonary asked to evaluate for potential need for ICU  History of present illness   74 year old nonvaccinated male admitted 12/7 with fever and dyspnea.  Also cough as well as syncopal episode at home.  Hypoxic in the emergency room requiring 8 to 9 L via nasal cannula to keep sats at 90% Covid positive. Admitted to the internal medicine service therapeutic interventions included the following treatment for COVID-19 infection and associated respiratory failure: Supplemental oxygen initially high flow, this is been persistent and also required addition of nonrebreather, active proning position, IV steroids, remdesivir, as well as 2 doses of Actemra received on 12/9 and 12/10. Over the course of his hospitalization he has remained on high flow oxygen, requiring nonrebreather mask as well as high flow oxygen Ranging from 35 L flow now up to 60 L flow, after some difficulty trying to get out of bed this morning.  Because of high oxygen demand, and tenuous respiratory status pulmonary asked to evaluate for possible transfer to the intensive care  Early 12/17 PCCM asked to re-evaluate as patient was more tachypneic overnight and oxygen sats down-trending to low 80%'s while on 60L HFNC with 15L non-rebreather.  Past Medical History  Glaucoma Not vaccinated  [place] [facility] Events   12/7 admitted started on remdesivir and systemic steroids 12/9: First dose of Actemra 12/10: Second dose of Actemra 12/15 remains on high flow oxygen, as well  as nonrebreather.  Had episode this morning requiring titration of oxygen up and took approximately < 1 minute to recover because of this pulmonary asked to evaluate for possible transfer to ICU  Consults:  Critical care consulted 12/15  Procedures:    Significant Diagnostic Tests:  CT angiogram 12/15>>>  Micro Data:  resp panel RT-PCR + COVID  Antimicrobials:  Remdesivir completed on 12/12  Interim history/subjective:  Pt with somewhat worsening hypoxia and starting to feel anxious despite maximal HFNC for about two days.  Will transfer to ICU for trial of Bipap.  Objective   Blood pressure 125/69, pulse 78, temperature 98.1 F (36.7 C), temperature source Oral, resp. rate 20, height 6\' 2"  (1.88 m), weight 119.5 kg, SpO2 (!) 88 %.    FiO2 (%):  [100 %] 100 %   Intake/Output Summary (Last 24 hours) at 09/26/2020 0201 Last data filed at 09/25/2020 2200 Gross per 24 hour  Intake 720 ml  Output 550 ml  Net 170 ml   Filed Weights   09/19/2020 2025 09/18/20 0234  Weight: 122.5 kg 119.5 kg    Examination: General: Elderly M, awake, mildly tachypneic and restless HENT: Normocephalic atraumatic mucous membranes moist Lungs: decreased air entry bilateral bases, currently no accessory use, currently 60 L high flow 100%, also nonrebreather.  Mild accessory muscle use Cardiovascular: Regular rate and rhythm without murmur rub or gallop Abdomen: Soft not tender Extremities: Warm dry Neuro: Awake oriented no focal deficits  Resolved Hospital Problem list     Assessment & Plan:    Acute hypoxic respiratory failure in the setting of Covid pneumonia and  ARDS Has been on maximal HFNC for two days with worsening oxygen saturations and increased WOB.  Completed remdesivir, Completed Actemra, last dose 12/10 and on systemic steroids with full dose anti-coagulation Plan: P: -transfer to ICU for trial of Bipap.  Attempt to avoid intubation has already has significant acute lung injury  and chances of prolonged ventilation and poor outcome very high if intubated , at high risk for intubation, goal pulse oximetry greater than 85% -Continue to encourage self proning -Continue incentive spirometry -Agree with CT angiogram to rule out pulmonary emboli  Best practice (evaluated daily)   Diet: NPO Pain/Anxiety/Delirium protocol (if indicated): Not indicated VAP protocol (if indicated): Not indicated DVT prophylaxis: Enoxaparin GI prophylaxis: Not indicated Glucose control: Not indicated Mobility: Out of bed, encouraging proning last date of multidisciplinary goals of care discussion: Pt would like [place] to discuss intubation vs DNI with his wife during the daytime Family and staff present none present Summary of discussion:  Follow up goals of care discussion due: 12/24  Code Status: Full code Disposition: ICU  Labs   CBC: Recent Labs  Lab 09/21/20 0454 09/22/20 0103 09/23/20 0525 09/24/20 0103 09/25/20 0308  WBC 11.9* 12.8* 15.1* 9.6 7.9  NEUTROABS 10.9* 11.9* 14.3* 9.1* 7.5  HGB 13.8 14.5 14.1 14.3 14.6  HCT 41.0 41.0 41.1 42.2 40.6  MCV 95.1 94.3 94.7 94.0 93.1  PLT 144* 146* 185 184 458    Basic Metabolic Panel: Recent Labs  Lab 09/21/20 0454 09/22/20 0745 09/23/20 0525 09/24/20 0103 09/25/20 0308  NA 140 137 139 138 136  K 3.8 3.9 4.1 4.3 4.7  CL 102 102 102 102 101  CO2 27 26 24 25 24   GLUCOSE 139* 129* 114* 127* 114*  BUN 26* 26* 27* 31* 30*  CREATININE 0.81 0.83 0.84 0.85 0.84  CALCIUM 8.2* 8.3* 8.4* 8.3* 8.4*  MG 2.4 2.5* 2.6* 2.6* 2.5*   GFR: Estimated Creatinine Clearance: 107.6 mL/min (by C-G formula based on SCr of 0.84 mg/dL). Recent Labs  Lab 09/22/20 0103 09/22/20 0745 09/23/20 0525 09/24/20 0103 09/25/20 0308  PROCALCITON  --  <0.10 <0.10 <0.10 <0.10  WBC 12.8*  --  15.1* 9.6 7.9    Liver Function Tests: Recent Labs  Lab 09/21/20 0454 09/22/20 0745 09/23/20 0525 09/24/20 0103 09/25/20 0308  AST 247* 151* 110* 97*  87*  ALT 285* 279* 250* 242* 217*  ALKPHOS 77 90 98 104 120  BILITOT 0.9 1.1 1.1 1.1 1.0  PROT 6.5 6.6 6.1* 6.2* 5.9*  ALBUMIN 2.9* 3.0* 2.9* 2.9* 2.8*   No results for input(s): LIPASE, AMYLASE in the last 168 hours. No results for input(s): AMMONIA in the last 168 hours.  ABG No results found for: PHART, PCO2ART, PO2ART, HCO3, TCO2, ACIDBASEDEF, O2SAT   Coagulation Profile: Recent Labs  Lab 09/21/20 0454 09/22/20 0745 09/23/20 0525 09/24/20 0103 09/25/20 0308  INR 1.2 1.2 1.2 1.2 1.3*    Cardiac Enzymes: No results for input(s): CKTOTAL, CKMB, CKMBINDEX, TROPONINI in the last 168 hours.  HbA1C: No results found for: HGBA1C  CBG: No results for input(s): GLUCAP in the last 168 hours.  Review of Systems:   Review of Systems  Constitutional: Negative.   HENT: Negative.   Eyes: Negative.   Respiratory: Positive for shortness of breath.        Currently thinks breathing is about the same as its been over the last several days symptom burden tolerable.  He said he had a rough morning, had tried to get  out of bed with limited support, got tangled up in IV tubing.  Took about a half an hour for him to feel like he had caught his breath after this  Cardiovascular: Negative.   Gastrointestinal: Negative.   Genitourinary: Negative.   Musculoskeletal: Negative.   Skin: Negative.   Neurological: Negative.   Endo/Heme/Allergies: Negative.   Psychiatric/Behavioral: Negative.      Past Medical History  He,  has a past medical history of Glaucoma, OSA (obstructive sleep apnea), and Skin cancer.   Surgical History    Past Surgical History:  Procedure Laterality Date  . VASECTOMY       Social History   reports that he has never smoked. He has never used smokeless tobacco. He reports that he does not drink alcohol and does not use drugs.   Family History   His family history includes Diabetes in his father.   Allergies No Known Allergies   Home Medications  Prior  to Admission medications   Medication Sig Start Date End Date Taking? Authorizing Provider  Ascorbic Acid (SUPER C COMPLEX PO) Take 1 tablet by mouth daily.   Yes [provider]  ciprofloxacin (CIPRO) 500 MG tablet Take 500 mg by mouth 2 (two) times daily.   Yes [provider]  dextromethorphan-guaiFENesin (MUCINEX DM) 30-600 MG 12hr tablet Take 1 tablet by mouth 2 (two) times daily as needed for cough.   Yes [provider]  Glucosamine HCl (GLUCOSAMINE PO) Take 1 tablet by mouth daily.   Yes [provider]  latanoprost (XALATAN) 0.005 % ophthalmic solution Place 1 drop into both eyes at bedtime. 08/26/20  Yes [provider]  Multiple Vitamins-Minerals (ZINC PO) Take 1 tablet by mouth daily.   Yes [provider]  VITAMIN D PO Take 1 capsule by mouth daily.   Yes [provider]     Critical care time: 35 minutes    CRITICAL CARE Performed by: Otilio Carpen Adaly Puder   Total critical care time: 35 minutes  Critical care time was exclusive of separately billable procedures and treating other patients.  Critical care was necessary to treat or prevent imminent or life-threatening deterioration.  Critical care was time spent personally by me on the following activities: development of treatment plan with patient and/or surrogate as well as nursing, discussions with consultants, evaluation of patient's response to treatment, examination of patient, obtaining history from patient or surrogate, ordering and performing treatments and interventions, ordering and review of laboratory studies, ordering and review of radiographic studies, pulse oximetry and re-evaluation of patient's condition.   Otilio Carpen Tamecca Artiga, PA-C [facility]  Pager# [phone], if no answer [phone]

## 2020-09-26 NOTE — Progress Notes (Signed)
Bilateral lower extremity venous study completed.      Please see CV Proc for preliminary results.   Kasee Hantz, RVT  

## 2020-09-26 NOTE — Progress Notes (Signed)
Pt has asked me to look at his chart during a face to face visit 12/16

## 2020-09-26 NOTE — Progress Notes (Signed)
eLink Physician-Brief Progress Note Patient Name: Javier Peterson DOB: 1946/03/14 MRN: [id number]   Date of Service  09/26/2020  HPI/Events of Note  Patient with acute hypoxic respiratory failure secondary to Covid 19 pneumonia, saturation in the 80's on  100 % oxygen via BIPAP.  eICU Interventions  EPAP increased to 13 with saturation up to 91 %. Patient appears calm.        Javier Peterson 09/26/2020, 2:16 AM

## 2020-09-26 NOTE — Progress Notes (Signed)
Respiratory therapy called to bedside for patient desatting to 80s.  Heated high flow cannot be adjusted higher.  Patient slow to recover to 88-90s, desats with any activity. Patient appears tired, work of breathing increased, accessory muscle use, denies chest pain.  Physician paged, advised patient's work of breathing has increased, unable to recover once he desats.  Dr Myna Hidalgo stated he would come see patient.  Rapid response nurse also called to assess patient.  Patient transferred to [facility] for bipap.  Patients wife Judeen Hammans updated about move to ICU.

## 2020-09-26 NOTE — Progress Notes (Signed)
PT Cancellation Note  Patient Details Name: Javier Peterson MRN: [id number] DOB: [birth year]   Cancelled Treatment:    Reason Eval/Treat Not Completed: Medical issues which prohibited therapy.  Pt transferred to the ICU this morning and is very tenuous.  I spoke with his RN who agreed PT should hold.  PT will check back on Monday 12/20 to see how he is doing.   Thanks,  Verdene Lennert, PT, DPT  Acute Rehabilitation [phone] pager #[phone] office       Javier Peterson 09/26/2020, 9:51 AM

## 2020-09-26 NOTE — Significant Event (Signed)
Rapid Response Event Note   Reason for Call :  SpO2-low 80s on 60L HHFNC 100% and NRB  Initial Focused Assessment:  Pt laying in bed with eyes closed. Pt alert and oriented, c/o difficulty breathing and tiring out. Pt has increased WOB and accessory muscle use. Lungs diminished t/o. Skin cool to touch.   HR-90, RR-20s, SpO2-81% on 60L HHFNC 100% and NRB.   Interventions:  Tx to 3M08 and placed on bipap.  Plan of Care:  Tx to 3W85   Event Summary:   MD Notified: Dr. Myna Hidalgo notified and to bedside, Mickel Baas, PCCM PA notified and to bedside.  Call Time:0120 Arrival [place] End Time:0220  Dillard Essex, RN

## 2020-09-27 ENCOUNTER — Inpatient Hospital Stay (HOSPITAL_COMMUNITY): Payer: Medicare HMO

## 2020-09-27 DIAGNOSIS — J9600 Acute respiratory failure, unspecified whether with hypoxia or hypercapnia: Secondary | ICD-10-CM

## 2020-09-27 LAB — CBC
HCT: 42.1 % (ref 39.0–52.0)
Hemoglobin: 14.9 g/dL (ref 13.0–17.0)
MCH: 33.3 pg (ref 26.0–34.0)
MCHC: 35.4 g/dL (ref 30.0–36.0)
MCV: 94 fL (ref 80.0–100.0)
Platelets: 84 10*3/uL — ABNORMAL LOW (ref 150–400)
RBC: 4.48 MIL/uL (ref 4.22–5.81)
RDW: 13.7 % (ref 11.5–15.5)
WBC: 14.2 10*3/uL — ABNORMAL HIGH (ref 4.0–10.5)
nRBC: 0 % (ref 0.0–0.2)

## 2020-09-27 LAB — D-DIMER, QUANTITATIVE: D-Dimer, Quant: 18.85 ug/mL-FEU — ABNORMAL HIGH (ref 0.00–0.50)

## 2020-09-27 LAB — GLUCOSE, CAPILLARY
Glucose-Capillary: 110 mg/dL — ABNORMAL HIGH (ref 70–99)
Glucose-Capillary: 97 mg/dL (ref 70–99)

## 2020-09-27 LAB — BRAIN NATRIURETIC PEPTIDE: B Natriuretic Peptide: 47.6 pg/mL (ref 0.0–100.0)

## 2020-09-27 LAB — HEPARIN ANTI-XA: Heparin LMW: 0.94 IU/mL

## 2020-09-27 LAB — C-REACTIVE PROTEIN: CRP: <0.5 mg/dL (ref <1.0)

## 2020-09-27 MED ORDER — FAMOTIDINE IN NACL 20-0.9 MG/50ML-% IV SOLN
20.0000 mg | Freq: Every day | INTRAVENOUS | Status: DC
  Administered 2020-09-27 – 2020-09-28 (×2): 20 mg via INTRAVENOUS
  Filled 2020-09-27 (×2): qty 50

## 2020-09-27 MED ORDER — DEXMEDETOMIDINE HCL IN NACL 400 MCG/100ML IV SOLN
0.2000 ug/kg/h | INTRAVENOUS | Status: DC
  Administered 2020-09-27: 0.2 ug/kg/h via INTRAVENOUS
  Administered 2020-09-27: 0.4 ug/kg/h via INTRAVENOUS
  Administered 2020-09-27: 0.6 ug/kg/h via INTRAVENOUS
  Administered 2020-09-27: 0.2 ug/kg/h via INTRAVENOUS
  Administered 2020-09-28: 0.8 ug/kg/h via INTRAVENOUS
  Administered 2020-09-28: 0.2 ug/kg/h via INTRAVENOUS
  Administered 2020-09-28: 0.5 ug/kg/h via INTRAVENOUS
  Administered 2020-09-29: 0.8 ug/kg/h via INTRAVENOUS
  Administered 2020-09-29 (×2): 1.2 ug/kg/h via INTRAVENOUS
  Administered 2020-09-29: 0.8 ug/kg/h via INTRAVENOUS
  Administered 2020-09-30: 0.4 ug/kg/h via INTRAVENOUS
  Filled 2020-09-27 (×12): qty 100

## 2020-09-27 NOTE — Progress Notes (Signed)
NAME:  Javier Peterson, MRN:  [id number], DOB:  21-May-1946, LOS: 9 ADMISSION DATE:  10/08/2020, CONSULTATION DATE:  12/15 REFERRING MD:  Tana Coast, CHIEF COMPLAINT:  Respiratory failure and COVID    Brief History   74 year old nonvaccinated male admitted with Covid pneumonia/ARDS on 12/7.  He is status post systemic steroids, remdesivir, Actemra received on 12/9 and 12/10.  In spite of all therapy remained on heated high flow 100% at 60 L, and nonrebreather mask.  Pulmonary asked to evaluate for potential need for ICU  History of present illness   74 year old nonvaccinated male admitted 12/7 with fever and dyspnea.  Also cough as well as syncopal episode at home.  Hypoxic in the emergency room requiring 8 to 9 L via nasal cannula to keep sats at 90% Covid positive. Admitted to the internal medicine service therapeutic interventions included the following treatment for COVID-19 infection and associated respiratory failure: Supplemental oxygen initially high flow, this is been persistent and also required addition of nonrebreather, active proning position, IV steroids, remdesivir, as well as 2 doses of Actemra received on 12/9 and 12/10. Over the course of his hospitalization he has remained on high flow oxygen, requiring nonrebreather mask as well as high flow oxygen Ranging from 35 L flow now up to 60 L flow, after some difficulty trying to get out of bed this morning.  Because of high oxygen demand, and tenuous respiratory status pulmonary asked to evaluate for possible transfer to the intensive care  Past Medical History  Glaucoma Not vaccinated  [place] [facility] Events   12/7 admitted started on remdesivir and systemic steroids 12/9: First dose of Actemra 12/10: Second dose of Actemra 12/15 remains on high flow oxygen, as well as nonrebreather.  Had episode this morning requiring titration of oxygen up and took approximately < 1 minute to recover because of this pulmonary asked to evaluate  for possible transfer to ICU 12/16 looked okay initially, critical care had signed off 12/17: Rapid response called, patient reporting worsening work of breathing, oxygen titrated up to 60 L heated high flow with 100% pulse ox initially in 81 range.  Moved to ICU placed on BiPAP.  Work of breathing improved, pulse oximetry stabilized.  Critical care assuming primary care  Consults:  Critical care consulted 12/15  Procedures:    Significant Diagnostic Tests:  CT angiogram 12/15>>>not done LE [place] 12/11: neg LE [place] 12/17>>> neg for DVT   Micro Data:  12/7 resp panel RT-PCR >> + COVID 12/7 BC x 2 >> neg 12/9 MRSA PCR  >> neg  Antimicrobials:  Remdesivir completed on 12/12  Interim history/subjective:  tmax 99.9 Ovenight, patient wanted to come off BiPAP, desat to 60's on HHFNC-> back on BiPAP.   Patient states breathing feels about the same Remains on precedex 0.4  Objective   Blood pressure 97/75, pulse (!) 58, temperature 99.5 F (37.5 C), temperature source Axillary, resp. rate 20, height 6\' 2"  (1.88 m), weight 119.5 kg, SpO2 95 %.    FiO2 (%):  [100 %] 100 %   Intake/Output Summary (Last 24 hours) at 09/27/2020 1117 Last data filed at 09/27/2020 1101 Gross per 24 hour  Intake 133.59 ml  Output 900 ml  Net -766.41 ml   Filed Weights   10/01/2020 2025 09/18/20 0234  Weight: 122.5 kg 119.5 kg    Examination: General:  Elderly male lying right lateral side in bed in NAD, resting  HEENT: MM pink/dry, full face mask, right pupil irregular, left 4/reactive Neuro:  Easily awakens, f/c, appropriate, MAE  CV: rr, SR/ SB, no murmur PULM:  Non labored on CPAP 10/ 1.0 FiO2, clear, diminished in bases  GI:  Obese, soft, NT, bs active  Extremities: warm/dry, no LE edema  Skin: no rashes  Resolved Hospital Problem list     Assessment & Plan:  Acute hypoxic respiratory failure in the setting of Covid pneumonia and ARDS -Completed remdesivir -Completed Actemra, last dose  12/10 -Has received systemic steroids Portable chest x-ray with persistent diffuse bilateral airspace disease We have not been able to get CT angiogram- too unstable, remains on full dose lovenox  Ongoing discussions about goals of care at bedside- as of 12/17; wishes to remain a full code with trial of short term aggressive care  Plan - Continue alternating between [place] and BiPAP/ CPAP as tolerated - Remains high risk for intubation - Maintain O2 saturations for goal SpO2 > 85% - Self/ side proning as tolerated - Ongoing aggressive pulmonary hygiene, progress activity as tolerated, mucinex/ robitussin prn  - continue zinc, vit C - prn albuterol HFA - LE dopplers negative; d-dimer continues to elevate 3.66- 9.07- 18.85 - Continue full dose lovenox for now - Day 9 steroids/ decreased to 40 mg daily 12/17, continue for now -Will need to monitor nutrition status closely, I/O's, currently stable renal function to consider intrinsic losses, consider cortrak when able to tolerate being off BiPAP for placement  Thrombocytopenia - on full dose lovenox - timing does not fit for HIT - continue to monitor for now   Leukocytosis - likely related to steroids - remains afebrile, continue to clinically monitor   Best practice (evaluated daily)   Diet: NPO for now Pain/Anxiety/Delirium protocol (if indicated): Not indicated VAP protocol (if indicated): Not indicated DVT prophylaxis: Enoxaparin, full dose GI prophylaxis: add pepcid  Glucose control: stable- continue to monitor q 4 while on steroids  Mobility: Out of bed, encouraging proning last date of multidisciplinary goals of care discussion pending Family and staff present none present Summary of discussion he is clinically worse introduced the idea of DNR and ventilation again.  He wanted time to speak with his wife, I will reach out to her as well  Goals of care discussion: Multidisciplinary goals of care with conducted at bedside  including nurse practitioner and RN with the patient on 12/17. Patient was a DNI/ DNR but changed back to full code.  See Marni Griffon, NP note 12/18.  Would not want longterm support if not improving, trial of 5-7 days of aggressive care  Status: Full code Disposition: ICU  Wife, Judeen Hammans [phone] and daughter, Maudie Mercury [phone] updated by phone.  All questions answered.   My cct 36 minutes   Kennieth Rad, ACNP [facility] 09/27/2020, 11:17 AM

## 2020-09-27 NOTE — Progress Notes (Signed)
Patient requested his Bipap be taken off and put back on HF [place]. Patient educated on importance to keep bipap on. Patient still refused Bipap. Patient very anxious. [place] notified. While on 70L 100% HF [place] the patient desat to 68%. Patient immediately put back on Bipap. Patient recovered to 90% O2 reading. Patient oriented to person, place, and situation. Agrees to wear Bipap, but still very anxious. Elink called and updated.

## 2020-09-28 LAB — C-REACTIVE PROTEIN: CRP: 0.5 mg/dL

## 2020-09-28 LAB — CBC
HCT: 48.1 % (ref 39.0–52.0)
Hemoglobin: 16.7 g/dL (ref 13.0–17.0)
MCH: 32.4 pg (ref 26.0–34.0)
MCHC: 34.7 g/dL (ref 30.0–36.0)
MCV: 93.4 fL (ref 80.0–100.0)
Platelets: 65 10*3/uL — ABNORMAL LOW (ref 150–400)
RBC: 5.15 MIL/uL (ref 4.22–5.81)
RDW: 13.5 % (ref 11.5–15.5)
WBC: 14.2 10*3/uL — ABNORMAL HIGH (ref 4.0–10.5)
nRBC: 0 % (ref 0.0–0.2)

## 2020-09-28 LAB — RENAL FUNCTION PANEL
Albumin: 2.7 g/dL — ABNORMAL LOW (ref 3.5–5.0)
Anion gap: 15 (ref 5–15)
BUN: 45 mg/dL — ABNORMAL HIGH (ref 8–23)
CO2: 22 mmol/L (ref 22–32)
Calcium: 8.4 mg/dL — ABNORMAL LOW (ref 8.9–10.3)
Chloride: 104 mmol/L (ref 98–111)
Creatinine, Ser: 1.02 mg/dL (ref 0.61–1.24)
GFR, Estimated: >60 mL/min
Glucose, Bld: 103 mg/dL — ABNORMAL HIGH (ref 70–99)
Phosphorus: 4.5 mg/dL (ref 2.5–4.6)
Potassium: 4.4 mmol/L (ref 3.5–5.1)
Sodium: 141 mmol/L (ref 135–145)

## 2020-09-28 LAB — BRAIN NATRIURETIC PEPTIDE: B Natriuretic Peptide: 61.4 pg/mL (ref 0.0–100.0)

## 2020-09-28 LAB — D-DIMER, QUANTITATIVE: D-Dimer, Quant: 14.93 ug/mL-FEU — ABNORMAL HIGH (ref 0.00–0.50)

## 2020-09-28 LAB — MAGNESIUM: Magnesium: 2.7 mg/dL — ABNORMAL HIGH (ref 1.7–2.4)

## 2020-09-28 LAB — PROCALCITONIN: Procalcitonin: 0.21 ng/mL

## 2020-09-28 LAB — GLUCOSE, CAPILLARY: Glucose-Capillary: 122 mg/dL — ABNORMAL HIGH (ref 70–99)

## 2020-09-28 MED ORDER — PANTOPRAZOLE SODIUM 40 MG PO TBEC: 40.0000 mg | DELAYED_RELEASE_TABLET | Freq: Every day | ORAL | Status: DC

## 2020-09-28 MED ORDER — SODIUM CHLORIDE 0.9 % IV SOLN
2.0000 g | INTRAVENOUS | Status: DC
  Administered 2020-09-28 – 2020-09-29 (×2): 2 g via INTRAVENOUS
  Filled 2020-09-28 (×2): qty 20

## 2020-09-28 MED ORDER — SODIUM CHLORIDE 0.9 % IV SOLN
500.0000 mg | INTRAVENOUS | Status: DC
  Administered 2020-09-28: 500 mg via INTRAVENOUS
  Filled 2020-09-28 (×2): qty 500

## 2020-09-28 MED ORDER — SODIUM CHLORIDE 0.9 % IV SOLN: INTRAVENOUS | Status: DC

## 2020-09-28 MED ORDER — LACTATED RINGERS IV BOLUS
500.0000 mL | Freq: Once | INTRAVENOUS | Status: AC
  Administered 2020-09-28: 500 mL via INTRAVENOUS

## 2020-09-28 NOTE — Progress Notes (Addendum)
Drug Monitoring: Lovenox full dose   12/18 - Checked low molecular weight heparin level as discussed with CCM NP to ensure appropriate dosing in setting of weight 119 kg.   LMWH level is therapeutic for goal at 0.94 units/mL on Lovenox 120mg  SQ BID.  Goal 0.6 to 1 units/mL.   Platelets are noted to be trending down (84 yesterday; 122 back on 10/10/2020). Not 50% drop - will continue to watch. Ddimer remains elevated but starting to trend down. No bleeding noted. H/H stable. SCr up slightly at 1.02 (baseline ~0.9).   Plan:  Continue Lovenox 120 mg SQ BID *Watch Platelets - added on to today's labs.  Monitor closely for any signs of bleeding.  Monitor renal function.

## 2020-09-28 NOTE — Progress Notes (Addendum)
NAME:  DMARION PERFECT, MRN:  [id number], DOB:  1946/08/30, LOS: 11 ADMISSION DATE:  09/27/2020, CONSULTATION DATE:  12/15 REFERRING MD:  Tana Coast, CHIEF COMPLAINT:  Respiratory failure and COVID    Brief History   74 year old nonvaccinated male admitted with Covid pneumonia/ARDS on 12/7.  He is status post systemic steroids, remdesivir, Actemra received on 12/9 and 12/10.  In spite of all therapy remained on heated high flow 100% at 60 L, and nonrebreather mask.  Pulmonary asked to evaluate for potential need for ICU  History of present illness   74 year old nonvaccinated male admitted 12/7 with fever and dyspnea.  Also cough as well as syncopal episode at home.  Hypoxic in the emergency room requiring 8 to 9 L via nasal cannula to keep sats at 90% Covid positive. Admitted to the internal medicine service therapeutic interventions included the following treatment for COVID-19 infection and associated respiratory failure: Supplemental oxygen initially high flow, this is been persistent and also required addition of nonrebreather, active proning position, IV steroids, remdesivir, as well as 2 doses of Actemra received on 12/9 and 12/10. Over the course of his hospitalization he has remained on high flow oxygen, requiring nonrebreather mask as well as high flow oxygen Ranging from 35 L flow now up to 60 L flow, after some difficulty trying to get out of bed this morning.  Because of high oxygen demand, and tenuous respiratory status pulmonary asked to evaluate for possible transfer to the intensive care  Past Medical History  Glaucoma Not vaccinated  [facility]   12/7 admitted started on remdesivir and systemic steroids 12/9: First dose of Actemra 12/10: Second dose of Actemra 12/15 remains on high flow oxygen, as well as nonrebreather.  Had episode this morning requiring titration of oxygen up and took approximately < 1 minute to recover because of this pulmonary asked to evaluate  for possible transfer to ICU 12/16 looked okay initially, critical care had signed off 12/17: Rapid response called, patient reporting worsening work of breathing, oxygen titrated up to 60 L heated high flow with 100% pulse ox initially in 81 range.  Moved to ICU placed on BiPAP.  Work of breathing improved, pulse oximetry stabilized.  Critical care assuming primary care 12/18 remains on CPAP 10, unable to tolerate BiPAP and desaturates on HHFNC and NRB, remains on low dose precedex 12/19, bump in sCr, borderline hypotensive, starting IVF/ bolus and empiric abx for worsening CXR R> L infiltrates   Consults:  Critical care consulted 12/15  Procedures:    Significant Diagnostic Tests:  CT angiogram 12/15>>>not done LE [place] 12/11: neg LE [place] 12/17>>> neg for DVT   Micro Data:  12/7 resp panel RT-PCR >> + COVID 12/7 BC x 2 >> neg 12/9 MRSA PCR  >> neg  Antimicrobials:  Remdesivir completed on 12/12  azithro 12/19 >> Ceftriaxone 12/19 >>  Interim history/subjective:  Afebrile Some lower BP overnight, SBP 80's, UOP ok, slight bump in sCr 0.84-> 1.02 -642/ net +563 ml Unable to tolerate off CPAP 10/ 1.0 FiO2 for long, despite HHFNC 60L with NRB, unable to tolerate BIPAP with pressure Remains on precedex  Patient states his breathing is the same, not tired, very thirsty   Objective   Blood pressure 102/62, pulse 71, temperature (!) 96.8 F (36 C), temperature source Axillary, resp. rate (!) 27, height 6\' 2"  (1.88 m), weight 119.5 kg, SpO2 (!) 86 %.    FiO2 (%):  [100 %] 100 %   Intake/Output Summary (  Last 24 hours) at 09/28/2020 1008 Last data filed at 09/28/2020 0700 Gross per 24 hour  Intake 507.96 ml  Output 1150 ml  Net -642.04 ml   Filed Weights   10/06/2020 2025 09/18/20 0234  Weight: 122.5 kg 119.5 kg    Examination: General:  Pleasant elderly male sitting upright on CPAP in NAD HEENT: full face mask, pupils 3/reactive Neuro: Awake, oriented, MAE CV: RR, no  murmur PULM:  Non labored on CPAP 10/ 1.0, clear anteriorly, bibasilar rhonchi, wheeze in LLL GI: soft, bs+, NT/ ND  Extremities: cool/dry, no edema  Skin: no rashes, scattered bruising to arms  Labs: glucose stable; K 4.4, sCr 0.84-> 1.02, Mag 2.7,  Pending CBC today   CXR 12/18 with worsening infiltrate on right, stable on left   Resolved Hospital Problem list     Assessment & Plan:  Acute hypoxic respiratory failure in the setting of Covid pneumonia and ARDS R/o CAP  -Completed remdesivir -Completed Actemra, last dose 12/10 -Has received systemic steroids Portable chest x-ray with persistent diffuse bilateral airspace disease We have not been able to get CT angiogram- too unstable, remains on full dose lovenox; LE dopplers negative Ongoing discussions about goals of care at bedside- as of 12/17; wishes to remain a full code with trial of short term aggressive care  Plan - Continue alternating between HHFNC/ NRB and BiPAP/ CPAP as tolerated - Remains high risk for intubation - Maintain O2 saturations for goal SpO2 > 85% - Self/ side proning as tolerated (does best on right side) - Ongoing aggressive pulmonary hygiene, progress activity as tolerated, mucinex/ robitussin prn  - continue zinc, vit C - prn albuterol HFA - Continue full dose lovenox for now - continue steroids 40 mg daily  - check PCT, has been negative, but given worsening CXR, progressive infiltrate on right, start empiric CAP coverage  - will order cortrak- post pyloric (given he requires CPAP to avoid increased risk of aspiration) and RD consult to help maximize nutrition   At risk for AKI - LR bolus 500 now and continue gentle IVF, NS 50 ml/hr - strict UOP/ trend renal indices/ MAP goal > 65  Thrombocytopenia - on full dose lovenox - timing does not fit for HIT - continue to monitor for now, 167-> 111-> 84 - pending CBC today  - no signs of bleeding/ clotting  Leukocytosis/ SIRS - likely related to  steroids, will trend PCT, previously negative  - start empiric CAP coverage   Best practice (evaluated daily)   Diet: NPO for now; cortrak ordered  Pain/Anxiety/Delirium protocol (if indicated): Not indicated VAP protocol (if indicated): Not indicated DVT prophylaxis: Enoxaparin, full dose GI prophylaxis: pepcid  Glucose control: stable- continue to monitor q 4 while on steroids  Mobility: Out of bed, encouraging proning last date of multidisciplinary goals of care discussion pending Family and staff present none present Summary of discussion he is clinically worse introduced the idea of DNR and ventilation again.  He wanted time to speak with his wife, I will reach out to her as well  Goals of care discussion: Multidisciplinary goals of care with conducted at bedside including nurse practitioner and RN with the patient on 12/17. Patient was a DNI/ DNR but changed his mind and code status back to full code;  See Marni Griffon, NP note 12/18.  Would not want longterm support if not improving, trial of 5-7 days of aggressive care  Status: Full code Disposition: ICU  Wife, Judeen Hammans, [phone] and daughter,  Maudie Mercury [phone] updated by phone 12/18.  All questions answered.   Pending for 12/19.  My cct 49 minutes   Kennieth Rad, ACNP [facility] 09/28/2020, 10:08 AM

## 2020-09-28 NOTE — Progress Notes (Signed)
PCCM Interval Note  I was able to call and update his wife Judeen Hammans, answered her questions.   Explained to her severity of his illness, prognosis, that he likely would not survive MV if it comes to that. Both have confirmed that he would want MV if it is needed.   Baltazar Apo, MD, PhD 09/28/2020, 5:29 PM [facility] [phone] or if no answer [phone]

## 2020-09-29 ENCOUNTER — Inpatient Hospital Stay (HOSPITAL_COMMUNITY): Payer: Medicare HMO

## 2020-09-29 ENCOUNTER — Inpatient Hospital Stay: Payer: Self-pay

## 2020-09-29 DIAGNOSIS — J80 Acute respiratory distress syndrome: Secondary | ICD-10-CM

## 2020-09-29 DIAGNOSIS — U071 COVID-19: Secondary | ICD-10-CM

## 2020-09-29 DIAGNOSIS — J9601 Acute respiratory failure with hypoxia: Secondary | ICD-10-CM

## 2020-09-29 LAB — APTT
aPTT: 31 seconds (ref 24–36)
aPTT: 32 seconds (ref 24–36)

## 2020-09-29 LAB — CBC WITH DIFFERENTIAL/PLATELET
Abs Immature Granulocytes: 0.05 10*3/uL (ref 0.00–0.07)
Basophils Absolute: 0 10*3/uL (ref 0.0–0.1)
Basophils Relative: 0 %
Eosinophils Absolute: 0.1 10*3/uL (ref 0.0–0.5)
Eosinophils Relative: 1 %
HCT: 46.4 % (ref 39.0–52.0)
Hemoglobin: 15.3 g/dL (ref 13.0–17.0)
Immature Granulocytes: 0 %
Lymphocytes Relative: 2 %
Lymphs Abs: 0.3 10*3/uL — ABNORMAL LOW (ref 0.7–4.0)
MCH: 31.9 pg (ref 26.0–34.0)
MCHC: 33 g/dL (ref 30.0–36.0)
MCV: 96.7 fL (ref 80.0–100.0)
Monocytes Absolute: 0.1 10*3/uL (ref 0.1–1.0)
Monocytes Relative: 1 %
Neutro Abs: 13.5 10*3/uL — ABNORMAL HIGH (ref 1.7–7.7)
Neutrophils Relative %: 96 %
Platelets: 54 10*3/uL — ABNORMAL LOW (ref 150–400)
RBC: 4.8 MIL/uL (ref 4.22–5.81)
RDW: 13.8 % (ref 11.5–15.5)
WBC: 14.1 10*3/uL — ABNORMAL HIGH (ref 4.0–10.5)
nRBC: 0 % (ref 0.0–0.2)

## 2020-09-29 LAB — D-DIMER, QUANTITATIVE: D-Dimer, Quant: 19.36 ug/mL-FEU — ABNORMAL HIGH (ref 0.00–0.50)

## 2020-09-29 LAB — CBC
HCT: 44.4 % (ref 39.0–52.0)
Hemoglobin: 15.3 g/dL (ref 13.0–17.0)
MCH: 33 pg (ref 26.0–34.0)
MCHC: 34.5 g/dL (ref 30.0–36.0)
MCV: 95.9 fL (ref 80.0–100.0)
Platelets: 42 10*3/uL — ABNORMAL LOW (ref 150–400)
RBC: 4.63 MIL/uL (ref 4.22–5.81)
RDW: 14.2 % (ref 11.5–15.5)
WBC: 16 10*3/uL — ABNORMAL HIGH (ref 4.0–10.5)
nRBC: 0 % (ref 0.0–0.2)

## 2020-09-29 LAB — GLUCOSE, CAPILLARY
Glucose-Capillary: 147 mg/dL — ABNORMAL HIGH (ref 70–99)
Glucose-Capillary: 152 mg/dL — ABNORMAL HIGH (ref 70–99)
Glucose-Capillary: 150 mg/dL — ABNORMAL HIGH (ref 70–99)
Glucose-Capillary: 112 mg/dL — ABNORMAL HIGH (ref 70–99)

## 2020-09-29 LAB — ECHOCARDIOGRAM LIMITED
Area-P 1/2: 3.12 cm2
Height: 74 in
S' Lateral: 3.4 cm
Weight: 4215.2 oz

## 2020-09-29 LAB — RENAL FUNCTION PANEL
Albumin: 2.6 g/dL — ABNORMAL LOW (ref 3.5–5.0)
Anion gap: 15 (ref 5–15)
BUN: 46 mg/dL — ABNORMAL HIGH (ref 8–23)
CO2: 22 mmol/L (ref 22–32)
Calcium: 8.2 mg/dL — ABNORMAL LOW (ref 8.9–10.3)
Chloride: 105 mmol/L (ref 98–111)
Creatinine, Ser: 0.98 mg/dL (ref 0.61–1.24)
GFR, Estimated: >60 mL/min (ref >60)
Glucose, Bld: 122 mg/dL — ABNORMAL HIGH (ref 70–99)
Phosphorus: 4.1 mg/dL (ref 2.5–4.6)
Potassium: 4.7 mmol/L (ref 3.5–5.1)
Sodium: 142 mmol/L (ref 135–145)

## 2020-09-29 LAB — POCT I-STAT 7, (LYTES, BLD GAS, ICA,H+H)
Acid-base deficit: 2 mmol/L (ref 0.0–2.0)
Bicarbonate: 24.2 mmol/L (ref 20.0–28.0)
Calcium, Ion: 1.17 mmol/L (ref 1.15–1.40)
HCT: 46 % (ref 39.0–52.0)
Hemoglobin: 15.6 g/dL (ref 13.0–17.0)
O2 Saturation: 95 %
Patient temperature: 97.7
Potassium: 4.2 mmol/L (ref 3.5–5.1)
Sodium: 146 mmol/L — ABNORMAL HIGH (ref 135–145)
TCO2: 26 mmol/L (ref 22–32)
pCO2 arterial: 42.9 mmHg (ref 32.0–48.0)
pH, Arterial: 7.358 (ref 7.350–7.450)
pO2, Arterial: 79 mmHg — ABNORMAL LOW (ref 83.0–108.0)

## 2020-09-29 LAB — PROCALCITONIN: Procalcitonin: 0.16 ng/mL

## 2020-09-29 MED ORDER — POLYETHYLENE GLYCOL 3350 17 G PO PACK
17.0000 g | PACK | Freq: Every day | ORAL | Status: DC
  Administered 2020-09-29 – 2020-10-02 (×4): 17 g
  Filled 2020-09-29 (×4): qty 1

## 2020-09-29 MED ORDER — ALTEPLASE (PULMONARY EMBOLISM) INFUSION
100.0000 mg | Freq: Once | INTRAVENOUS | Status: AC
  Administered 2020-09-29: 100 mg via INTRAVENOUS
  Filled 2020-09-29: qty 100

## 2020-09-29 MED ORDER — ETOMIDATE 2 MG/ML IV SOLN
INTRAVENOUS | Status: AC
  Filled 2020-09-29: qty 10

## 2020-09-29 MED ORDER — SODIUM CHLORIDE 0.9% FLUSH
10.0000 mL | INTRAVENOUS | Status: DC | PRN
  Administered 2020-09-29: 10 mL

## 2020-09-29 MED ORDER — FENTANYL CITRATE (PF) 100 MCG/2ML IJ SOLN
25.0000 ug | Freq: Once | INTRAMUSCULAR | Status: AC
  Administered 2020-09-29: 25 ug via INTRAVENOUS

## 2020-09-29 MED ORDER — ACETAMINOPHEN 650 MG RE SUPP: 650.0000 mg | RECTAL | Status: DC | PRN

## 2020-09-29 MED ORDER — CHLORHEXIDINE GLUCONATE 0.12% ORAL RINSE (MEDLINE KIT)
15.0000 mL | Freq: Two times a day (BID) | OROMUCOSAL | Status: DC
  Administered 2020-09-29: 15 mL via OROMUCOSAL

## 2020-09-29 MED ORDER — NOREPINEPHRINE 4 MG/250ML-% IV SOLN
0.0000 ug/min | INTRAVENOUS | Status: DC
  Administered 2020-09-29: 10 ug/min via INTRAVENOUS
  Filled 2020-09-29: qty 250

## 2020-09-29 MED ORDER — DEXTROSE IN LACTATED RINGERS 5 % IV SOLN: INTRAVENOUS | Status: DC

## 2020-09-29 MED ORDER — SODIUM BICARBONATE 8.4 % IV SOLN
INTRAVENOUS | Status: AC
  Administered 2020-09-29: 50 meq
  Filled 2020-09-29: qty 50

## 2020-09-29 MED ORDER — FENTANYL CITRATE (PF) 100 MCG/2ML IJ SOLN
INTRAMUSCULAR | Status: AC
  Filled 2020-09-29: qty 2

## 2020-09-29 MED ORDER — ETOMIDATE 2 MG/ML IV SOLN
20.0000 mg | Freq: Once | INTRAVENOUS | Status: AC
  Administered 2020-09-29: 20 mg via INTRAVENOUS

## 2020-09-29 MED ORDER — ORAL CARE MOUTH RINSE
15.0000 mL | OROMUCOSAL | Status: DC
  Administered 2020-09-29 (×3): 15 mL via OROMUCOSAL

## 2020-09-29 MED ORDER — PANTOPRAZOLE SODIUM 40 MG IV SOLR
40.0000 mg | INTRAVENOUS | Status: DC
  Administered 2020-09-29: 40 mg via INTRAVENOUS
  Filled 2020-09-29: qty 40

## 2020-09-29 MED ORDER — NOREPINEPHRINE 16 MG/250ML-% IV SOLN
0.0000 ug/min | INTRAVENOUS | Status: DC
  Administered 2020-09-29: 2 ug/min via INTRAVENOUS
  Filled 2020-09-29: qty 250

## 2020-09-29 MED ORDER — SODIUM CHLORIDE 0.9 % IV SOLN
250.0000 mL | Freq: Once | INTRAVENOUS | Status: AC
  Administered 2020-09-29: 250 mL via INTRAVENOUS

## 2020-09-29 MED ORDER — SODIUM CHLORIDE 0.9% FLUSH: 3.0000 mL | INTRAVENOUS | Status: DC | PRN

## 2020-09-29 MED ORDER — SODIUM BICARBONATE 4.2 % IV SOLN: 50.0000 meq | Freq: Once | INTRAVENOUS | Status: DC

## 2020-09-29 MED ORDER — ATROPINE SULFATE 1 MG/10ML IJ SOSY
PREFILLED_SYRINGE | INTRAMUSCULAR | Status: AC
  Filled 2020-09-29: qty 10

## 2020-09-29 MED ORDER — DOCUSATE SODIUM 50 MG/5ML PO LIQD
100.0000 mg | Freq: Two times a day (BID) | ORAL | Status: DC
  Administered 2020-09-29 – 2020-10-02 (×7): 100 mg
  Filled 2020-09-29 (×7): qty 10

## 2020-09-29 MED ORDER — SODIUM CHLORIDE 0.9 % IV SOLN: 250.0000 mL | INTRAVENOUS | Status: DC | PRN

## 2020-09-29 MED ORDER — FENTANYL BOLUS VIA INFUSION
25.0000 ug | INTRAVENOUS | Status: DC | PRN
  Administered 2020-09-29 – 2020-09-30 (×5): 25 ug via INTRAVENOUS
  Filled 2020-09-29: qty 25

## 2020-09-29 MED ORDER — FREE WATER
100.0000 mL | Freq: Three times a day (TID) | Status: DC
  Administered 2020-09-29 – 2020-10-04 (×15): 100 mL

## 2020-09-29 MED ORDER — LACTATED RINGERS IV BOLUS: 1000.0000 mL | Freq: Once | INTRAVENOUS | Status: DC

## 2020-09-29 MED ORDER — EPINEPHRINE 1 MG/10ML IJ SOSY
PREFILLED_SYRINGE | INTRAMUSCULAR | Status: AC
  Filled 2020-09-29: qty 10

## 2020-09-29 MED ORDER — INSULIN ASPART 100 UNIT/ML ~~LOC~~ SOLN
2.0000 [IU] | SUBCUTANEOUS | Status: DC
  Administered 2020-09-29: 4 [IU] via SUBCUTANEOUS
  Administered 2020-09-29: 2 [IU] via SUBCUTANEOUS
  Administered 2020-09-30 (×2): 4 [IU] via SUBCUTANEOUS
  Administered 2020-09-30 – 2020-10-01 (×3): 2 [IU] via SUBCUTANEOUS
  Administered 2020-10-01: 4 [IU] via SUBCUTANEOUS
  Administered 2020-10-01 – 2020-10-02 (×4): 2 [IU] via SUBCUTANEOUS
  Administered 2020-10-02 (×2): 4 [IU] via SUBCUTANEOUS
  Administered 2020-10-02 – 2020-10-04 (×8): 2 [IU] via SUBCUTANEOUS

## 2020-09-29 MED ORDER — ROCURONIUM BROMIDE 10 MG/ML (PF) SYRINGE: 70.0000 mg | PREFILLED_SYRINGE | INTRAVENOUS | Status: DC | PRN

## 2020-09-29 MED ORDER — ARGATROBAN 50 MG/50ML IV SOLN
0.5000 ug/kg/min | INTRAVENOUS | Status: DC
  Filled 2020-09-29: qty 50

## 2020-09-29 MED ORDER — ORAL CARE MOUTH RINSE
15.0000 mL | OROMUCOSAL | Status: DC
  Administered 2020-09-29 – 2020-10-09 (×94): 15 mL via OROMUCOSAL

## 2020-09-29 MED ORDER — ROCURONIUM BROMIDE 50 MG/5ML IV SOLN: 70.0000 mg | Freq: Once | INTRAVENOUS | Status: DC

## 2020-09-29 MED ORDER — SODIUM CHLORIDE 0.9% FLUSH
10.0000 mL | Freq: Two times a day (BID) | INTRAVENOUS | Status: DC
  Administered 2020-09-29 – 2020-09-30 (×2): 10 mL
  Administered 2020-09-30: 20 mL
  Administered 2020-10-01 – 2020-10-09 (×15): 10 mL

## 2020-09-29 MED ORDER — PIVOT 1.5 CAL PO LIQD
1000.0000 mL | ORAL | Status: DC
  Administered 2020-09-29 – 2020-10-01 (×3): 1000 mL
  Filled 2020-09-29 (×6): qty 1000

## 2020-09-29 MED ORDER — MIDAZOLAM HCL 2 MG/2ML IJ SOLN
1.0000 mg | INTRAMUSCULAR | Status: AC | PRN
Start: 2020-09-29 — End: 2020-10-01
  Administered 2020-09-29 – 2020-10-01 (×3): 1 mg via INTRAVENOUS
  Filled 2020-09-29 (×3): qty 2

## 2020-09-29 MED ORDER — MIDAZOLAM HCL 2 MG/2ML IJ SOLN
1.0000 mg | INTRAMUSCULAR | Status: DC | PRN
  Administered 2020-09-29 – 2020-10-02 (×3): 1 mg via INTRAVENOUS
  Filled 2020-09-29 (×4): qty 2

## 2020-09-29 MED ORDER — MIDAZOLAM HCL 2 MG/2ML IJ SOLN
INTRAMUSCULAR | Status: AC
  Filled 2020-09-29: qty 4

## 2020-09-29 MED ORDER — ROCURONIUM BROMIDE 10 MG/ML (PF) SYRINGE
PREFILLED_SYRINGE | INTRAVENOUS | Status: AC
  Administered 2020-09-29: 70 mg
  Filled 2020-09-29: qty 10

## 2020-09-29 MED ORDER — PHENYLEPHRINE 40 MCG/ML (10ML) SYRINGE FOR IV PUSH (FOR BLOOD PRESSURE SUPPORT)
PREFILLED_SYRINGE | INTRAVENOUS | Status: AC
  Administered 2020-09-29: 400 ug
  Filled 2020-09-29: qty 10

## 2020-09-29 MED ORDER — FENTANYL 2500MCG IN NS 250ML (10MCG/ML) PREMIX INFUSION
25.0000 ug/h | INTRAVENOUS | Status: DC
  Administered 2020-09-29: 50 ug/h via INTRAVENOUS
  Administered 2020-09-30 – 2020-10-02 (×6): 200 ug/h via INTRAVENOUS
  Filled 2020-09-29 (×7): qty 250

## 2020-09-29 MED ORDER — SODIUM CHLORIDE 0.9 % IV SOLN
INTRAVENOUS | Status: DC
  Administered 2020-09-29 (×2): 250 mL via INTRAVENOUS

## 2020-09-29 MED ORDER — FENTANYL CITRATE (PF) 100 MCG/2ML IJ SOLN
100.0000 ug | Freq: Once | INTRAMUSCULAR | Status: AC
  Administered 2020-09-29: 100 ug via INTRAVENOUS

## 2020-09-29 MED ORDER — ETOMIDATE 2 MG/ML IV SOLN
INTRAVENOUS | Status: AC
  Filled 2020-09-29: qty 20

## 2020-09-29 MED ORDER — MIDAZOLAM HCL 2 MG/2ML IJ SOLN
2.0000 mg | Freq: Once | INTRAMUSCULAR | Status: AC
  Administered 2020-09-29: 2 mg via INTRAVENOUS

## 2020-09-29 MED ORDER — LACTATED RINGERS IV BOLUS
500.0000 mL | Freq: Once | INTRAVENOUS | Status: AC
  Administered 2020-09-29: 500 mL via INTRAVENOUS

## 2020-09-29 MED ORDER — SODIUM CHLORIDE 0.9% FLUSH
3.0000 mL | Freq: Two times a day (BID) | INTRAVENOUS | Status: DC
  Administered 2020-09-29 – 2020-10-01 (×3): 3 mL via INTRAVENOUS

## 2020-09-29 MED ORDER — CHLORHEXIDINE GLUCONATE 0.12% ORAL RINSE (MEDLINE KIT)
15.0000 mL | Freq: Two times a day (BID) | OROMUCOSAL | Status: DC
  Administered 2020-09-30 – 2020-10-09 (×19): 15 mL via OROMUCOSAL

## 2020-09-29 MED ORDER — SODIUM CHLORIDE 0.9 % IV BOLUS
1000.0000 mL | Freq: Once | INTRAVENOUS | Status: AC
  Administered 2020-09-29: 1000 mL via INTRAVENOUS

## 2020-09-29 NOTE — Progress Notes (Addendum)
NAME:  Javier Peterson, MRN:  [id number], DOB:  19-Feb-1946, LOS: 29 ADMISSION DATE:  09/25/2020, CONSULTATION DATE:  12/15 REFERRING MD:  Tana Coast, CHIEF COMPLAINT:  Respiratory failure and COVID    Brief History    74 year old nonvaccinated male admitted 12/7 with fever and dyspnea.  Also cough as well as syncopal episode at home.  Hypoxic in the emergency room requiring 8 to 9 L via nasal cannula to keep sats at 90% Covid positive. Admitted to the internal medicine service therapeutic interventions included the following treatment for COVID-19 infection and associated respiratory failure: Supplemental oxygen initially high flow, this is been persistent and also required addition of nonrebreather, active proning position, IV steroids, remdesivir, as well as 2 doses of Actemra received on 12/9 and 12/10. Over the course of his hospitalization he has remained on high flow oxygen, requiring nonrebreather mask as well as high flow oxygen Ranging from 35 L flow now up to 60 L flow, after some difficulty trying to get out of bed this morning.  Because of high oxygen demand, and tenuous respiratory status pulmonary asked to evaluate for possible transfer to the intensive care   There is no immunization history on file for this patient.   Past Medical History  Glaucoma Not vaccinated  [facility] Hospital Events   12/7 admitted started on remdesivir and systemic steroids  12/9: First dose of Actemra  12/10: Second dose of Actemra  12/15 remains on high flow oxygen, as well as nonrebreather.  Had episode this morning requiring titration of oxygen up and took approximately < 1 minute to recover because of this pulmonary asked to evaluate for possible transfer to ICU  12/16 looked okay initially, critical care had signed off  12/17: Rapid response called, patient reporting worsening work of breathing, oxygen titrated up to 60 L heated high flow with 100% pulse ox initially in 81 range.  Moved to  ICU placed on BiPAP.  Work of breathing improved, pulse oximetry stabilized.  Critical care assuming primary care  12/18 remains on CPAP 10, unable to tolerate BiPAP and desaturates on HHFNC and NRB, remains on low dose precedex. FUll CODE  12/19, bump in sCr, borderline hypotensive, starting IVF/ bolus and empiric abx for worsening CXR R> L infiltrates ,. Afebrile Some lower BP overnight, SBP 80's, UOP ok, slight bump in sCr 0.84-> 1.02 -642/ net +563 ml Unable to tolerate off CPAP 10/ 1.0 FiO2 for long, despite HHFNC 60L with NRB, unable to tolerate BIPAP with pressure Remains on precedex  Patient states his breathing is the same, not tired, very thirsty   12./20 aM - -d dimer better. Plateetls worse - 50s. Familhy conversation yesterday - full code. Has easy desaturations. Net neg since admit. Afebrile.  100% FiO2 on BiPAP pulse ox 82%.  Appears significantly fatigued.    Consults:  Critical care consulted 12/15  Procedures:    Significant Diagnostic Tests:  CT angiogram 12/15>>>not done LE [place] 12/11: neg LE [place] 12/17>>> neg for DVT   Micro Data:  12/7 resp panel RT-PCR >> + COVID 12/7 BC x 2 >> neg 12/9 MRSA PCR  >> neg  Antimicrobials:  Remdesivir completed on 12/12  azithro 12/19 >>12/20 Ceftriaxone 12/19 >>  Interim history/subjective:   09/29/2020  - PM rounds - resp muscule fatigue and intubated . He is limited code.  Hypotensive post intubation,. ECHO with mild RV strain, HAs NEW - DVT - Findings consistent with acute deep vein thrombosis involving the right posterior tibial veins,  and right peroneal veins.  Has had PICC  Objective   Blood pressure (!) 83/56, pulse (!) 58, temperature (!) 97.3 F (36.3 C), temperature source Axillary, resp. rate (!) 23, height 6\' 2"  (1.88 m), weight 119.5 kg, SpO2 97 %.    Vent Mode: PRVC FiO2 (%):  [100 %] 100 % Set Rate:  [28 bmp] 28 bmp Vt Set:  [201 mL] 620 mL PEEP:  [16 cmH20] 16 cmH20 Plateau Pressure:  [44 cmH20] 44  cmH20   Intake/Output Summary (Last 24 hours) at 09/29/2020 1743 Last data filed at 09/29/2020 1600 Gross per 24 hour  Intake 1865.31 ml  Output 850 ml  Net 1015.31 ml   Filed Weights   09/17/2020 2025 09/18/20 0234  Weight: 122.5 kg 119.5 kg    Examination: Now intubated fio2 100%, peep1 6, RR 28, Vt 7-8cc/kg/IBW RASS -4 on sedation gtt    Resolved Hospital Problem list     Assessment & Plan:  Acute hypoxic respiratory failure in the setting of Covid pneumonia and ARDS -unable to get CT chest due to instability R/o CAP  -Completed remdesivir -Completed Actemra, last dose 12/10 -Has received systemic steroids  09/29/20 - ARDS now intubated. PF ratio < 100. Has new RV strain on echo/DVT. Post intubation shock +. Likely has PE - massive/sub-massive. 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  Empiric antibiotic treatment started.  On 09/29/2020 no evidence of sepsis/pneumonia  Plan -check trach aspirate 09/29/20 pm -Continue ceftriaxone for now  Circulatory shock   - +ve post intubation 09/29/20  Plan  - levophed via PIV  - fluid bolus via PIV - MAP goal > 65 - if gets worse can do lytics for possible PE   At risk for AKI  - strict UOP/ trend renal indices/ MAP goal > 65  Thrombocytopenia  -  167-> 111-> 84 -> 50s -   09/29/2020 - Worsening platelets. Profile not c/w HITT but has New DVT 09/29/20. 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 Stll NPO. 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 They are in agreement and consented for moving forward as benefit outweighs risk  GOALS OF CARE       Multi-Disciplinary Goals of Care Discussion Date of Discussion 09/26/2020 an 12/19  09/29/2020   Primary  service for patient     Location of discussion     Family and Staff present Marni Griffon, patient and wife Dr Lamonte Sakai on phone with wife Judeen Hammans Dr Chase Caller, April RN, wife and daughter  Summary of discussion Reversed code status to full code including short term 5-7 days aggressive ventilator care if needed nlcuing pressors but not long term support and no HD Full code. Intubate + if needed Wife says she is married him to 69  Years and will be difficult to see him on ventilator though it might be c/w goals his care -> l;ater at bedside with wife and daughter -> wife says they are devout christians and know of people esp 1 other devoute christian who was on vent for 55 days and survived. They undersand such miracle is not possible always but wife believes patient sees parallel with that . In addition, h eis a fighter and previiously well and he is afraid of leaving wife behind without a  Fight. She did says no CPR if he arrests and is not interested in prolonged mech ventilato  Followup goals of care due by   Wife invited to bedside - d/w April charge RN  Misc comments if any    No CPR. If he codes during intubation -> no CPR - d/w Wife and daughter and they affirmed it        Status: Full code  Disposition: ICU     ATTESTATION & SIGNATURE   The patient Javier Peterson is critically ill with multiple organ systems failure and requires high complexity decision making for assessment and support, frequent evaluation and titration of therapies, application of advanced monitoring technologies and extensive interpretation of multiple databases.   Critical Care Time devoted to patient care services described in this note is  60  Minutes. This time reflects time of care of this signee Dr Brand Males. This critical care time does not reflect procedure time, or teaching time or supervisory time of PA/NP/Med student/Med Resident etc but could involve care discussion time     Dr. Brand Males, M.D., [facility].C.P Pulmonary and Critical Care Medicine Staff Physician [place] [facility] Pager: [phone], If no answer or between  15:00h - 7:00h: call [phone]  09/29/2020 6:00 PM     LABS    PULMONARY Recent Labs  Lab 09/29/20 1730  PHART 7.358  PCO2ART 42.9  PO2ART 79*  HCO3 24.2  TCO2 26  O2SAT 95.0    CBC Recent Labs  Lab 09/27/20 0620 09/28/20 1421 09/29/20 0215 09/29/20 1730  HGB 14.9 16.7 15.3 15.6  HCT 42.1 48.1 46.4 46.0  WBC 14.2* 14.2* 14.1*  --   PLT 84* 65* 54*  --     COAGULATION Recent Labs  Lab 09/23/20 0525 09/24/20 0103 09/25/20 0308  INR 1.2 1.2 1.3*    CARDIAC  No results for input(s): TROPONINI in the last 168 hours. No results for input(s): PROBNP in the last 168 hours.   CHEMISTRY Recent Labs  Lab 09/23/20 0525 09/24/20 0103 09/25/20 0308 09/26/20 0714 09/28/20 0211 09/29/20 0215 09/29/20 1730  NA 139 138 136 137 141 142 146*  K 4.1 4.3 4.7 4.4 4.4 4.7 4.2  CL 102 102 101 99 104 105  --   CO2 24 25 24 25 22 22   --   GLUCOSE 114* 127* 114* 98  103* 122*  --   BUN 27* 31* 30* 28* 45* 46*  --  CREATININE 0.84 0.85 0.84 0.84 1.02 0.98  --   CALCIUM 8.4* 8.3* 8.4* 8.5* 8.4* 8.2*  --   MG 2.6* 2.6* 2.5*  --  2.7*  --   --   PHOS  --   --   --   --  4.5 4.1  --    Estimated Creatinine Clearance: 92.2 mL/min (by C-G formula based on SCr of 0.98 mg/dL).   LIVER Recent Labs  Lab 09/23/20 0525 09/24/20 0103 09/25/20 0308 09/26/20 0714 09/28/20 0211 09/29/20 0215  AST 110* 97* 87* 81*  --   --   ALT 250* 242* 217* 186*  --   --   ALKPHOS 98 104 120 143*  --   --   BILITOT 1.1 1.1 1.0 0.8  --   --   PROT 6.1* 6.2* 5.9* 6.3*  --   --   ALBUMIN 2.9* 2.9* 2.8* 3.0* 2.7* 2.6*  INR 1.2 1.2 1.3*  --   --   --      INFECTIOUS Recent Labs  Lab 09/25/20 0308 09/28/20 1421 09/29/20 0215  PROCALCITON <0.10 0.21 0.16     ENDOCRINE CBG (last 3)  Recent Labs    09/28/20 1844 09/29/20 1224 09/29/20 1523  GLUCAP 122* 147* 152*         IMAGING x48h  - image(s) personally visualized  -   highlighted in bold Portable Chest x-ray  Result Date: 09/29/2020 CLINICAL DATA:  Evaluate ET tube and OG tube placement. EXAM: PORTABLE CHEST 1 VIEW COMPARISON:  09/27/2020 FINDINGS: ET tube tip is above the carina. Nasogastric tube tip is below the GE junction. Normal heart size. Diffuse bilateral interstitial and airspace densities are unchanged from previous exam. IMPRESSION: 1. Satisfactory position of ET tube and NG tube. 2. No change in aeration to the lungs compared with previous exam. Electronically Signed   By: Kerby Moors M.D.   On: 09/29/2020 15:06   DG Abd Portable 1V  Result Date: 09/29/2020 CLINICAL DATA:  OG tube placement EXAM: PORTABLE ABDOMEN - 1 VIEW COMPARISON:  None. FINDINGS: The OG tube tip projects over the gastric body. The tip is pointed distally. IMPRESSION: OG tube projects over the stomach. Electronically Signed   By: Constance Holster M.D.   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 Comparison Study: Previous 09/26/20 Performing Technologist: Vonzell Schlatter RVT  Examination Guidelines: A complete evaluation includes B-mode imaging, spectral Doppler, color Doppler, and power Doppler as needed of all accessible portions of each vessel. Bilateral testing is considered an integral part of a complete examination. Limited examinations for reoccurring indications may be performed as noted. The reflux portion of the exam is performed with the patient in reverse Trendelenburg.  +---------+---------------+---------+-----------+----------+--------------+ RIGHT    CompressibilityPhasicitySpontaneityPropertiesThrombus Aging +---------+---------------+---------+-----------+----------+--------------+ CFV      Full           Yes      Yes                                 +---------+---------------+---------+-----------+----------+--------------+ SFJ      Full                                                        +---------+---------------+---------+-----------+----------+--------------+  FV Prox  Full                                                        +---------+---------------+---------+-----------+----------+--------------+ FV Mid   Full                                                        +---------+---------------+---------+-----------+----------+--------------+ FV DistalFull                                                        +---------+---------------+---------+-----------+----------+--------------+ PFV      Full                                                        +---------+---------------+---------+-----------+----------+--------------+ POP      Full           Yes      Yes                                 +---------+---------------+---------+-----------+----------+--------------+ PTV      None                                                         +---------+---------------+---------+-----------+----------+--------------+ PERO     None                                                        +---------+---------------+---------+-----------+----------+--------------+   +---------+---------------+---------+-----------+----------+--------------+ LEFT     CompressibilityPhasicitySpontaneityPropertiesThrombus Aging +---------+---------------+---------+-----------+----------+--------------+ CFV      Full           Yes      Yes                                 +---------+---------------+---------+-----------+----------+--------------+ SFJ      Full                                                        +---------+---------------+---------+-----------+----------+--------------+ FV Prox  Full                                                        +---------+---------------+---------+-----------+----------+--------------+  FV Mid   Full                                                        +---------+---------------+---------+-----------+----------+--------------+ FV DistalFull                                                        +---------+---------------+---------+-----------+----------+--------------+ PFV      Full                                                        +---------+---------------+---------+-----------+----------+--------------+ POP      Full           Yes      Yes                                 +---------+---------------+---------+-----------+----------+--------------+ PTV      Full                                                        +---------+---------------+---------+-----------+----------+--------------+ PERO     Full                                                        +---------+---------------+---------+-----------+----------+--------------+     Summary: RIGHT: - Findings consistent with acute deep vein thrombosis involving the right posterior tibial veins, and  right peroneal veins. - No cystic structure found in the popliteal fossa.  LEFT: - There is no evidence of deep vein thrombosis in the lower extremity.  - No cystic structure found in the popliteal fossa.  *See table(s) above for measurements and observations. Electronically signed by Deitra Mayo MD on 09/29/2020 at 4:33:30 PM.    Final    ECHOCARDIOGRAM LIMITED  Result Date: 09/29/2020    ECHOCARDIOGRAM REPORT   Patient Name:   Javier Peterson Date of Exam: 09/29/2020 Medical Rec #:  [id number]          Height:       74.0 in Accession #:    [id number]         Weight:       263.4 lb Date of Birth:  12-30-1945          BSA:          2.443 m Patient Age:    48 years           BP:           90/73 mmHg Patient Gender: M                  HR:  59 bpm. Exam Location:  Inpatient Procedure: Cardiac Doppler, Color Doppler and Limited Echo Indications:    COVID-19.  History:        Patient has no prior history of Echocardiogram examinations.                 Signs/Symptoms:Shortness of Breath; Risk Factors:Sleep Apnea.                 COVID-19.  Sonographer:    Clayton Lefort RDCS (AE) Referring Phys: [id number] [facility]  Sonographer Comments: No subcostal window. Acute respiratory distress R06.03 IMPRESSIONS  1. Limited echo  2. Left ventricular ejection fraction, by estimation, is 55 to 60%. The left ventricle has normal function. The left ventricle has no regional wall motion abnormalities. There is moderate left ventricular hypertrophy. Left ventricular diastolic parameters are consistent with Grade I diastolic dysfunction (impaired relaxation).  3. Right ventricular systolic function is low normal. The right ventricular size is moderately enlarged. Tricuspid regurgitation signal is inadequate for assessing PA pressure.  4. The mitral valve is grossly normal. No evidence of mitral valve regurgitation.  5. The aortic valve is tricuspid. Aortic valve regurgitation is not visualized.  6. Aortic dilatation  noted. 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 Comparison(s): No prior Echocardiogram. FINDINGS  Left Ventricle: Left ventricular ejection fraction, by estimation, is 55 to 60%. The left ventricle has normal function. The left ventricle has no regional wall motion abnormalities. The left ventricular internal cavity size was normal in size. There is  moderate left ventricular hypertrophy. Left ventricular diastolic parameters are consistent with Grade I diastolic dysfunction (impaired relaxation). Indeterminate filling pressures. Right Ventricle: The right ventricular size is moderately enlarged. No increase in right ventricular wall thickness. Right ventricular systolic function is low normal. Tricuspid regurgitation signal is inadequate for assessing PA pressure. Left Atrium: Left atrial size was normal in size. Right Atrium: Right atrial size was normal in size. Pericardium: There is no evidence of pericardial effusion. Mitral Valve: The mitral valve is grossly normal. No evidence of mitral valve regurgitation. Tricuspid Valve: The tricuspid valve is not well visualized. Tricuspid valve regurgitation is not demonstrated. Aortic Valve: The aortic valve is tricuspid. Aortic valve regurgitation is not visualized. Pulmonic Valve: The pulmonic valve was not well visualized. Pulmonic valve regurgitation is not visualized. Aorta: Aortic dilatation noted. 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 Venous: The inferior vena cava was not well visualized. IAS/Shunts: The interatrial septum was not assessed.  LEFT VENTRICLE PLAX 2D LVIDd:         4.70 cm  Diastology LVIDs:         3.40 cm  LV e' medial:    4.13 cm/s LV PW:         1.40 cm  LV E/e' medial:  12.9 LV IVS:        1.50 cm  LV e'  lateral:   5.77 cm/s LVOT diam:     2.80 cm  LV E/e' lateral: 9.2 LVOT Area:     6.16 cm  LEFT ATRIUM         Index LA diam:    3.60 cm 1.47 cm/m   AORTA Ao Root diam: 4.30 cm Ao Asc diam:  4.20 cm MITRAL VALVE MV  Area (PHT): 3.12 cm    SHUNTS MV Decel Time: 243 msec    Systemic Diam: 2.80 cm MV E velocity: 53.10 cm/s MV A velocity: 62.60 cm/s MV E/A ratio:  0.85 Lyman Bishop MD Electronically signed by Lyman Bishop MD Signature Date/Time: 09/29/2020/2:36:38 PM    Final    [place] EKG SITE RITE  Result Date: 09/29/2020 If Site Rite image not attached, placement could not be confirmed due to current cardiac rhythm.

## 2020-09-29 NOTE — Progress Notes (Signed)
Telephone consent obtained from wife Goodwin Kamphaus for PICC insertion.

## 2020-09-29 NOTE — Progress Notes (Signed)
  Echocardiogram 2D Echocardiogram has been performed.  Javier Peterson 09/29/2020, 11:45 AM

## 2020-09-29 NOTE — Progress Notes (Signed)
Bilateral lower extremity venous study completed.   Spoke with April RN regarding abnormal results   Please see CV Proc for preliminary results.   Vonzell Schlatter, RVT

## 2020-09-29 NOTE — Plan of Care (Signed)
Called to bedside due to desaturation episode with hemodynamic instability. He had pulled his bipap off and was holding the pieces in his hands. His nurses got to bedside quickly and were able to put it back on. Now HR 63, regular, RR 25, 85% saturations which is similar to where he has been. He is able to answer questions and is not confused. He has pulled at his bipap inadvertently throughout the shift. Discussed with bedside RNs, charge nurse. Patient requires 1:1 sitter to prevent removing oxygen devices. Given his quick recovery and hemodynamic stability, controlled work of breathing currently, he does not require emergent intubation as long as we have an intervention to prevent accidental removal of respiratory devices. If sitter not available or level of consciousness is worse or recurrent hemodynamic instability, he will require intubation for airway protection.   Julian Hy, DO 09/29/20 3:57 AM [facility]

## 2020-09-29 NOTE — Progress Notes (Signed)
PT Cancellation Note  Patient Details Name: RUMALDO DIFATTA MRN: [id number] DOB: 1946/03/22   Cancelled Treatment:    Reason Eval/Treat Not Completed: Medical issues which prohibited therapy (pt currently with struggling respiratory status at 84% FiO2 with questionable intubation vs comfort)   Tristan Bramble B Celita Aron 09/29/2020, 7:51 AM  Bayard Males, PT Acute Rehabilitation Services Pager: [phone] Office: [phone]

## 2020-09-29 NOTE — Procedures (Signed)
Arterial Catheter Insertion Procedure Note  Javier Peterson  [id number]  1946-02-13  Date:09/29/20  Time:5:23 PM    Provider Performing: Sharla Kidney    Procedure: Insertion of Arterial Line [id number]) without US guidance  Indication(s) Blood pressure monitoring and/or need for frequent ABGs  Consent Unable to obtain consent due to emergent nature of procedure.  Anesthesia None   Time Out Verified patient identification, verified procedure, site/side was marked, verified correct patient position, special equipment/implants available, medications/allergies/relevant history reviewed, required imaging and test results available.   Sterile Technique Maximal sterile technique including full sterile barrier drape, hand hygiene, sterile gown, sterile gloves, mask, hair covering, sterile ultrasound probe cover (if used).   Procedure Description Area of catheter insertion was cleaned with chlorhexidine and draped in sterile fashion. Without real-time ultrasound guidance an arterial catheter was placed into the left radial artery.  Appropriate arterial tracings confirmed on monitor.     Complications/Tolerance None; patient tolerated the procedure well.   EBL Minimal   Specimen(s) None

## 2020-09-29 NOTE — Progress Notes (Addendum)
ANTICOAGULATION CONSULT NOTE - Initial Consult  Pharmacy Consult for Argatroban Indication: DVT  No Known Allergies  Patient Measurements: Height: 6\' 2"  (188 cm) Weight: 119.5 kg (263 lb 7.2 oz) IBW/kg (Calculated) : 82.2  Vital Signs: BP: 83/56 (12/20 1700) Pulse Rate: 58 (12/20 1730)  Labs: Recent Labs    09/27/20 0620 09/27/20 2256 09/28/20 0211 09/28/20 1421 09/29/20 0215 09/29/20 1730  HGB 14.9  --   --  16.7 15.3 15.6  HCT 42.1  --   --  48.1 46.4 46.0  PLT 84*  --   --  65* 54*  --   HEPRLOWMOCWT  --  0.94  --   --   --   --   CREATININE  --   --  1.02  --  0.98  --     Estimated Creatinine Clearance: 92.2 mL/min (by C-G formula based on SCr of 0.98 mg/dL).   Medical History: Past Medical History:  Diagnosis Date  . Glaucoma   . OSA (obstructive sleep apnea)   . Skin cancer       Assessment: 09/29/2020 - Worsening platelets. Profile not c/w HITT but has New DVT 09/29/20. HIGH HITT SCORe - 6-7 points. Pharmacy consulted to start direct thrombin inhibitor.  Will initiate argatroban therapy     Goal of Therapy:  aPTT 50 to 90 seconds Monitor platelets by anticoagulation protocol: Yes   Plan:  Start argatroban at 0.71mcg/kg/min Check aPTT 2 hours after start Monitor aPTT daily once therapeutic and CBC  Alanda Slim, PharmD, [place] Clinical Pharmacist Please see AMION for all Pharmacists' Contact Phone Numbers 09/29/2020, 6:11 PM   D/C Argatroban  ECHO with RV strain Giving TPA 100 mg IV x 1 Consider restart argatroban when aPTT < 80  Alanda Slim, PharmD, [facility] Clinical Pharmacist Please see AMION for all Pharmacists' Contact Phone Numbers 09/29/2020, 6:44 PM

## 2020-09-29 NOTE — Progress Notes (Signed)
CRITICAL VALUE ALERT  Critical Value:  BLE Venous Dopplar Positive in RLE  Date & Time Notied:  09/29/2020 4:17 PM   Provider Notified: Yes  Orders Received/Actions taken: Awaiting orders at this time

## 2020-09-29 NOTE — Progress Notes (Signed)
Initial Nutrition Assessment  DOCUMENTATION CODES:   Not applicable  INTERVENTION:   Initiate tube feeding via OG tube: Pivot 1.5 at 70 ml/h (1680 ml per day)  Provides 2520 kcal, 157 gm protein, 1275 ml free water daily   NUTRITION DIAGNOSIS:   Increased nutrient needs related to catabolic illness (COVID+PNA) as evidenced by estimated needs.  GOAL:   Patient will meet greater than or equal to 90% of their needs  MONITOR:   TF tolerance,Vent status  REASON FOR ASSESSMENT:   Consult Assessment of nutrition requirement/status  ASSESSMENT:   Pt with PMH of not vaccinated and glaucoma admitted 12/7 with COVID+ PNA.    Pt being intubated, spoke with MD ok to start nutrition via OG tube.   12/7 admitted started on remdesivir and steroids 12/17 tx to ICU for O2 requirements of 60 L heated high flow transition to BiPAP 12/20 cortrak ordered - no service available; started on D5 @ 50 ml/h; intubated in the afternoon now with OG tube access can start TF   Medications reviewed and include: vitamin C, solumedrol, zinc  Precedex  Labs reviewed:  CBG's: 122-147   Diet Order:   Diet Order    None      EDUCATION NEEDS:   No education needs have been identified at this time  Skin:  Skin Assessment: Reviewed RN Assessment  Last BM:  unknown  Height:   Ht Readings from Last 1 Encounters:  09/29/20 6\' 2"  (1.88 m)    Weight:   Wt Readings from Last 1 Encounters:  09/18/20 119.5 kg    Ideal Body Weight:  86.3 kg  BMI:  Body mass index is 33.82 kg/m.  Estimated Nutritional Needs:   Kcal:  2979-8921  Protein:  150-165 grams  Fluid:  > 2 L/day  Lockie Pares., RD, LDN, CNSC See AMiON for contact information

## 2020-09-29 NOTE — Procedures (Signed)
Intubation Procedure Note  MILTON SAGONA  [id number]  26-May-1946  Date:09/29/20  Time:2:56 PM   Provider Performing:Laura Radilla    Procedure: Intubation (31500)  Indication(s) Respiratory Failure  Consent Risks of the procedure as well as the alternatives and risks of each were explained to the patient and/or caregiver.  Consent for the procedure was obtained and is signed in the bedside chart   Anesthesia Etomidate, Versed, Fentanyl, Rocuronium and bic bolus   Time Out Verified patient identification, verified procedure, site/side was marked, verified correct patient position, special equipment/implants available, medications/allergies/relevant history reviewed, required imaging and test results available.   Sterile Technique Usual hand hygeine, masks, and gloves were used   Procedure Description Patient positioned in bed supine.  Sedation given as noted above.  Patient was intubated with endotracheal tube using Glidescope.  View was Grade 1 full glottis .  Number of attempts was 1.  Colorimetric CO2 detector was consistent with tracheal placement.   Complications/Tolerance None; patient tolerated the procedure well. Chest X-ray is ordered to verify placement.   EBL none   Specimen(s) None     SIGNATURE    Dr. Brand Males, M.D., F.C.C.P,  Pulmonary and Critical Care Medicine Staff Physician, [place] Director - Interstitial Lung Disease  Program  P[facility] [place] [facility] [place], [place], [address]  Pager: [phone], If no answer  OR between  19:00-7:00h: page [phone] Telephone (clinical office): [phone] Telephone (research): [phone]  2:56 PM 09/29/2020

## 2020-09-29 NOTE — Progress Notes (Addendum)
NAME:  Javier Peterson, MRN:  [id number], DOB:  03/18/1946, LOS: 63 ADMISSION DATE:  10/01/2020, CONSULTATION DATE:  12/15 REFERRING MD:  Javier Peterson, CHIEF COMPLAINT:  Respiratory failure and COVID    Brief History    74 year old nonvaccinated male admitted 12/7 with fever and dyspnea.  Also cough as well as syncopal episode at home.  Hypoxic in the emergency room requiring 8 to 9 L via nasal cannula to keep sats at 90% Covid positive. Admitted to the internal medicine service therapeutic interventions included the following treatment for COVID-19 infection and associated respiratory failure: Supplemental oxygen initially high flow, this is been persistent and also required addition of nonrebreather, active proning position, IV steroids, remdesivir, as well as 2 doses of Actemra received on 12/9 and 12/10. Over the course of his hospitalization he has remained on high flow oxygen, requiring nonrebreather mask as well as high flow oxygen Ranging from 35 L flow now up to 60 L flow, after some difficulty trying to get out of bed this morning.  Because of high oxygen demand, and tenuous respiratory status pulmonary asked to evaluate for possible transfer to the intensive care   There is no immunization history on file for this patient.   Past Medical History  Glaucoma Not vaccinated  Javier Peterson Events   12/7 admitted started on remdesivir and systemic steroids  12/9: First dose of Actemra  12/10: Second dose of Actemra  12/15 remains on high flow oxygen, as well as nonrebreather.  Had episode this morning requiring titration of oxygen up and took approximately < 1 minute to recover because of this pulmonary asked to evaluate for possible transfer to ICU  12/16 looked okay initially, critical care had signed off  12/17: Rapid response called, patient reporting worsening work of breathing, oxygen titrated up to 60 L heated high flow with 100% pulse ox initially in 81 range.  Moved to  ICU placed on BiPAP.  Work of breathing improved, pulse oximetry stabilized.  Critical care assuming primary care  12/18 remains on CPAP 10, unable to tolerate BiPAP and desaturates on HHFNC and NRB, remains on low dose precedex. FUll CODE  12/19, bump in sCr, borderline hypotensive, starting IVF/ bolus and empiric abx for worsening CXR R> L infiltrates ,. Afebrile Some lower BP overnight, SBP 80's, UOP ok, slight bump in sCr 0.84-> 1.02 -642/ net +563 ml Unable to tolerate off CPAP 10/ 1.0 FiO2 for long, despite HHFNC 60L with NRB, unable to tolerate BIPAP with pressure Remains on precedex  Patient states his breathing is the same, not tired, very thirsty   Consults:  Critical care consulted 12/15  Procedures:    Significant Diagnostic Tests:  CT angiogram 12/15>>>not done LE [place] 12/11: neg LE [place] 12/17>>> neg for DVT   Micro Data:  12/7 resp panel RT-PCR >> + COVID 12/7 BC x 2 >> neg 12/9 MRSA PCR  >> neg  Antimicrobials:  Remdesivir completed on 12/12  azithro 12/19 >>12/20 Ceftriaxone 12/19 >>  Interim history/subjective:   09/29/2020  - -d dimer better. Plateetls worse - 50s. Familhy conversation yesterday - full code. Has easy desaturations. Net neg since admit. Afebrile.  100% FiO2 on BiPAP pulse ox 82%.  Appears significantly fatigued.  Results for Javier Peterson (MRN [id number]) as of 09/29/2020 08:37  Ref. Range 09/24/2020 01:03 09/25/2020 03:08 09/26/2020 07:14 09/27/2020 06:20 09/28/2020 02:11  D-Dimer, Quant Latest Ref Range: 0.00 - 0.50 ug/mL-FEU 3.66 (H) 9.07 (H) 17.43 (H) 18.85 (H) 14.93 (H)  Objective   Blood pressure (!) 96/53, pulse (!) 55, temperature (!) 97.3 F (36.3 C), temperature source Axillary, resp. rate 20, height 6\' 2"  (1.88 m), weight 119.5 kg, SpO2 (!) 87 %.    FiO2 (%):  [100 %] 100 %   Intake/Output Summary (Last 24 hours) at 09/29/2020 0836 Last data filed at 09/29/2020 0700 Gross per 24 hour  Intake 1400.31 ml  Output 1025  ml  Net 375.31 ml   Filed Weights   09/14/2020 2025 09/18/20 0234  Weight: 122.5 kg 119.5 kg    Examination: General: In bed on BiPAP  HEENT: full face mask, pupils 3/reactive Neuro: Easily arousable but looks fatigued CV: RR, no murmur PULM: BiPAP in early paradoxus present.  Pulse ox 82% 100% FiO2  GI: soft, bs+, NT/ ND  Extremities: cool/dry, no edema  Skin: no rashes, scattered bruising to arms   Resolved Peterson Problem list     Assessment & Plan:  Acute hypoxic respiratory failure in the setting of Covid pneumonia and ARDS -unable to get CT chest due to instability R/o CAP  -Completed remdesivir -Completed Actemra, last dose 12/10 -Has received systemic steroids  09/29/2020: Severe ARDS with respiratory muscle fatigue.  Currently on BiPAP.  Heading towards intubation  Plan - Continue alternating between HHFNC/ NRB and BiPAP/ CPAP as tolerated - Remains high risk for intubation -likely needs it today 09/29/2020 - Maintain O2 saturations for goal SpO2 > 85% -  - Ongoing aggressive pulmonary hygiene, progress activity as tolerated, mucinex/ robitussin prn  - continue zinc, vit C - prn albuterol HFA - continue steroids 40 mg daily    Rule out sepsis   -Had worsening infiltrates 09/28/2020.  Empiric antibiotic treatment started.  On 09/29/2020 no evidence of sepsis/pneumonia  Plan -DC azithromycin -Continue ceftriaxone for now   At risk for AKI  - strict UOP/ trend renal indices/ MAP goal > 65  Thrombocytopenia - on full dose lovenox - timing does not fit for HIT -  167-> 111-> 84 -> 50s -No sign of bleeding or increased thrombosis [D-dimer downtrending]  Plan -Check HIT panel -DC Lovenox - done 09/28/20 -Repeat Doppler lower extremity 09/29/2020    Best practice (evaluated daily)   Diet: NPO for now; cortrak ordered 09/28/20. Stll NPO. Order D5LR at 50cc/h Pain/Anxiety/Delirium protocol (if indicated): Not indicated VAP protocol (if indicated):  Not indicated DVT prophylaxis: Enoxaparin, full dose GI prophylaxis: pepcid  Glucose control: stable- continue to monitor q 4 while on steroids  Mobility: Bed rest  Wife, Javier Peterson [phone] and daughter, Javier Peterson [phone] updated by phone . GOALS OF CARE       Multi-Disciplinary Goals of Care Discussion Date of Discussion 09/26/2020 an 12/19  09/29/2020   Primary service for patient     Location of discussion     Family and Staff present Marni Griffon, patient and wife Dr Lamonte Sakai on phone with wife Javier Peterson Dr Chase Caller, April RN, wife and daughter  Summary of discussion Reversed code status to full code including short term 5-7 days aggressive ventilator care if needed nlcuing pressors but not long term support and no HD Full code. Intubate + if needed Wife says she is married him to 37  Years and will be difficult to see him on ventilator though it might be c/w goals his care -> l;ater at bedside with wife and daughter -> wife says they are devout christians and know of people esp 1 other devoute christian who was on vent for 55 days and survived.  They undersand such miracle is not possible always but wife believes patient sees parallel with that . In addition, h eis a fighter and previiously well and he is afraid of leaving wife behind without a  Fight. She did says no CPR if he arrests and is not interested in prolonged mech ventilato  Followup goals of care due by   Wife invited to bedside - d/w April charge RN  Misc comments if any   No CPR. If he codes during intubation -> no CPR - d/w Wife and daughter and they affirmed it        Status: Full code  Disposition: ICU    ATTESTATION & SIGNATURE   The patient Javier Peterson is critically ill with multiple organ systems failure and requires high complexity decision making for assessment and support, frequent evaluation and titration of therapies, application of advanced monitoring technologies and extensive interpretation of  multiple databases.   Critical Care Time devoted to patient care services described in this note is  60  Minutes. This time reflects time of care of this signee Dr Brand Males. This critical care time does not reflect procedure time, or teaching time or supervisory time of PA/NP/Med student/Med Resident etc but could involve care discussion time     Dr. Brand Males, M.D., [facility].C.P Pulmonary and Critical Care Medicine Staff Physician [place] [facility] Pager: [phone], If no answer or between  15:00h - 7:00h: call [phone]  09/29/2020 8:38 AM     LABS    PULMONARY No results for input(s): PHART, PCO2ART, PO2ART, HCO3, TCO2, O2SAT in the last 168 hours.  Invalid input(s): PCO2, PO2  CBC Recent Labs  Lab 09/27/20 0620 09/28/20 1421 09/29/20 0215  HGB 14.9 16.7 15.3  HCT 42.1 48.1 46.4  WBC 14.2* 14.2* 14.1*  PLT 84* 65* 54*    COAGULATION Recent Labs  Lab 09/23/20 0525 09/24/20 0103 09/25/20 0308  INR 1.2 1.2 1.3*    CARDIAC  No results for input(s): TROPONINI in the last 168 hours. No results for input(s): PROBNP in the last 168 hours.   CHEMISTRY Recent Labs  Lab 09/23/20 0525 09/24/20 0103 09/25/20 0308 09/26/20 0714 09/28/20 0211 09/29/20 0215  NA 139 138 136 137 141 142  K 4.1 4.3 4.7 4.4 4.4 4.7  CL 102 102 101 99 104 105  CO2 24 25 24 25 22 22   GLUCOSE 114* 127* 114* 98 103* 122*  BUN 27* 31* 30* 28* 45* 46*  CREATININE 0.84 0.85 0.84 0.84 1.02 0.98  CALCIUM 8.4* 8.3* 8.4* 8.5* 8.4* 8.2*  MG 2.6* 2.6* 2.5*  --  2.7*  --   PHOS  --   --   --   --  4.5 4.1   Estimated Creatinine Clearance: 92.2 mL/min (by C-G formula based on SCr of 0.98 mg/dL).   LIVER Recent Labs  Lab 09/23/20 0525 09/24/20 0103 09/25/20 0308 09/26/20 0714 09/28/20 0211 09/29/20 0215  AST 110* 97* 87* 81*  --   --   ALT 250* 242* 217* 186*  --   --   ALKPHOS 98 104 120 143*  --   --   BILITOT 1.1 1.1 1.0  0.8  --   --   PROT 6.1* 6.2* 5.9* 6.3*  --   --   ALBUMIN 2.9* 2.9* 2.8* 3.0* 2.7* 2.6*  INR 1.2 1.2 1.3*  --   --   --  INFECTIOUS Recent Labs  Lab 09/25/20 0308 09/28/20 1421 09/29/20 0215  PROCALCITON <0.10 0.21 0.16     ENDOCRINE CBG (last 3)  Recent Labs    09/27/20 0358 09/27/20 0820 09/28/20 1844  GLUCAP 110* 97 122*         IMAGING x48h  - image(s) personally visualized  -   highlighted in bold DG Chest Port 1 View  Result Date: 09/27/2020 CLINICAL DATA:  Respiratory distress due to COVID-19 EXAM: PORTABLE CHEST 1 VIEW COMPARISON:  September 26, 2020 FINDINGS: Increasing opacity in the right lung, particularly the right base. Persistent opacity in left base. Stable cardiomediastinal silhouette. No pneumothorax. No other acute abnormalities. IMPRESSION: Worsening infiltrate on the right and stable infiltrate on the left, consistent with the patient's known COVID-19 pneumonia. Electronically Signed   By: Dorise Bullion III M.D   On: 09/27/2020 14:57

## 2020-09-30 ENCOUNTER — Inpatient Hospital Stay (HOSPITAL_COMMUNITY): Payer: Medicare HMO

## 2020-09-30 LAB — COMPREHENSIVE METABOLIC PANEL
ALT: 133 U/L — ABNORMAL HIGH (ref 0–44)
AST: 57 U/L — ABNORMAL HIGH (ref 15–41)
Albumin: 2.8 g/dL — ABNORMAL LOW (ref 3.5–5.0)
Alkaline Phosphatase: 147 U/L — ABNORMAL HIGH (ref 38–126)
Anion gap: 14 (ref 5–15)
BUN: 55 mg/dL — ABNORMAL HIGH (ref 8–23)
CO2: 23 mmol/L (ref 22–32)
Calcium: 8.3 mg/dL — ABNORMAL LOW (ref 8.9–10.3)
Chloride: 110 mmol/L (ref 98–111)
Creatinine, Ser: 1.19 mg/dL (ref 0.61–1.24)
GFR, Estimated: >60 mL/min (ref >60)
Glucose, Bld: 112 mg/dL — ABNORMAL HIGH (ref 70–99)
Potassium: 4.6 mmol/L (ref 3.5–5.1)
Sodium: 147 mmol/L — ABNORMAL HIGH (ref 135–145)
Total Bilirubin: 0.8 mg/dL (ref 0.3–1.2)
Total Protein: 5.9 g/dL — ABNORMAL LOW (ref 6.5–8.1)

## 2020-09-30 LAB — CBC WITH DIFFERENTIAL/PLATELET
Abs Immature Granulocytes: 0.08 10*3/uL — ABNORMAL HIGH (ref 0.00–0.07)
Basophils Absolute: 0 10*3/uL (ref 0.0–0.1)
Basophils Relative: 0 %
Eosinophils Absolute: 0.3 10*3/uL (ref 0.0–0.5)
Eosinophils Relative: 2 %
HCT: 46.8 % (ref 39.0–52.0)
Hemoglobin: 15.4 g/dL (ref 13.0–17.0)
Immature Granulocytes: 1 %
Lymphocytes Relative: 3 %
Lymphs Abs: 0.4 10*3/uL — ABNORMAL LOW (ref 0.7–4.0)
MCH: 32.2 pg (ref 26.0–34.0)
MCHC: 32.9 g/dL (ref 30.0–36.0)
MCV: 97.7 fL (ref 80.0–100.0)
Monocytes Absolute: 0.1 10*3/uL (ref 0.1–1.0)
Monocytes Relative: 1 %
Neutro Abs: 14.8 10*3/uL — ABNORMAL HIGH (ref 1.7–7.7)
Neutrophils Relative %: 93 %
Platelets: 42 10*3/uL — ABNORMAL LOW (ref 150–400)
RBC: 4.79 MIL/uL (ref 4.22–5.81)
RDW: 14.4 % (ref 11.5–15.5)
WBC: 15.6 10*3/uL — ABNORMAL HIGH (ref 4.0–10.5)
nRBC: 0 % (ref 0.0–0.2)

## 2020-09-30 LAB — POCT I-STAT 7, (LYTES, BLD GAS, ICA,H+H)
Acid-base deficit: 1 mmol/L (ref 0.0–2.0)
Bicarbonate: 25.7 mmol/L (ref 20.0–28.0)
Calcium, Ion: 1.15 mmol/L (ref 1.15–1.40)
HCT: 44 % (ref 39.0–52.0)
Hemoglobin: 15 g/dL (ref 13.0–17.0)
O2 Saturation: 96 %
Patient temperature: 98.9
Potassium: 4.4 mmol/L (ref 3.5–5.1)
Sodium: 146 mmol/L — ABNORMAL HIGH (ref 135–145)
TCO2: 27 mmol/L (ref 22–32)
pCO2 arterial: 48 mmHg (ref 32.0–48.0)
pH, Arterial: 7.336 — ABNORMAL LOW (ref 7.350–7.450)
pO2, Arterial: 87 mmHg (ref 83.0–108.0)

## 2020-09-30 LAB — GLUCOSE, CAPILLARY
Glucose-Capillary: 108 mg/dL — ABNORMAL HIGH (ref 70–99)
Glucose-Capillary: 119 mg/dL — ABNORMAL HIGH (ref 70–99)
Glucose-Capillary: 145 mg/dL — ABNORMAL HIGH (ref 70–99)
Glucose-Capillary: 149 mg/dL — ABNORMAL HIGH (ref 70–99)
Glucose-Capillary: 162 mg/dL — ABNORMAL HIGH (ref 70–99)
Glucose-Capillary: 181 mg/dL — ABNORMAL HIGH (ref 70–99)

## 2020-09-30 LAB — APTT
aPTT: 52 seconds — ABNORMAL HIGH (ref 24–36)
aPTT: 54 seconds — ABNORMAL HIGH (ref 24–36)

## 2020-09-30 LAB — PROTIME-INR
INR: 2 — ABNORMAL HIGH (ref 0.8–1.2)
Prothrombin Time: 21.8 seconds — ABNORMAL HIGH (ref 11.4–15.2)

## 2020-09-30 LAB — PHOSPHORUS: Phosphorus: 5.3 mg/dL — ABNORMAL HIGH (ref 2.5–4.6)

## 2020-09-30 LAB — SAR COV2 SEROLOGY (COVID19)AB(IGG),IA: SARS-CoV-2 Ab, IgG: REACTIVE — AB

## 2020-09-30 LAB — D-DIMER, QUANTITATIVE: D-Dimer, Quant: >20 ug/mL-FEU — ABNORMAL HIGH (ref 0.00–0.50)

## 2020-09-30 LAB — HEPARIN INDUCED PLATELET AB (HIT ANTIBODY): Heparin Induced Plt Ab: 0.312 OD (ref 0.000–0.400)

## 2020-09-30 LAB — MAGNESIUM: Magnesium: 2.8 mg/dL — ABNORMAL HIGH (ref 1.7–2.4)

## 2020-09-30 MED ORDER — PANTOPRAZOLE SODIUM 40 MG PO PACK
40.0000 mg | PACK | Freq: Every day | ORAL | Status: DC
  Administered 2020-09-30 – 2020-10-02 (×3): 40 mg
  Filled 2020-09-30 (×3): qty 20

## 2020-09-30 MED ORDER — ARGATROBAN 50 MG/50ML IV SOLN
0.5500 ug/kg/min | INTRAVENOUS | Status: DC
  Administered 2020-09-30 (×2): 0.5 ug/kg/min via INTRAVENOUS
  Filled 2020-09-30 (×4): qty 50

## 2020-09-30 MED ORDER — ZINC SULFATE 220 (50 ZN) MG PO CAPS
220.0000 mg | ORAL_CAPSULE | Freq: Every day | ORAL | Status: DC
  Administered 2020-09-30 – 2020-10-03 (×4): 220 mg
  Filled 2020-09-30 (×4): qty 1

## 2020-09-30 MED ORDER — NOREPINEPHRINE 16 MG/250ML-% IV SOLN
0.0000 ug/min | INTRAVENOUS | Status: DC
  Administered 2020-10-01: 7 ug/min via INTRAVENOUS
  Administered 2020-10-02 – 2020-10-03 (×2): 4 ug/min via INTRAVENOUS
  Filled 2020-09-30 (×3): qty 250

## 2020-09-30 MED ORDER — ASCORBIC ACID 500 MG PO TABS
500.0000 mg | ORAL_TABLET | Freq: Every day | ORAL | Status: DC
  Administered 2020-09-30 – 2020-10-08 (×9): 500 mg
  Filled 2020-09-30 (×9): qty 1

## 2020-09-30 MED ORDER — DEXMEDETOMIDINE HCL IN NACL 400 MCG/100ML IV SOLN
0.2000 ug/kg/h | INTRAVENOUS | Status: DC
  Administered 2020-09-30: 1.2 ug/kg/h via INTRAVENOUS
  Administered 2020-09-30 (×2): 0.9 ug/kg/h via INTRAVENOUS
  Administered 2020-10-01: 1.2 ug/kg/h via INTRAVENOUS
  Administered 2020-10-01: 0.9 ug/kg/h via INTRAVENOUS
  Administered 2020-10-01 (×3): 1.2 ug/kg/h via INTRAVENOUS
  Administered 2020-10-01: 0.9 ug/kg/h via INTRAVENOUS
  Administered 2020-10-01 – 2020-10-02 (×5): 1.2 ug/kg/h via INTRAVENOUS
  Filled 2020-09-30 (×15): qty 100

## 2020-09-30 MED ORDER — ARGATROBAN 50 MG/50ML IV SOLN
0.5000 ug/kg/min | INTRAVENOUS | Status: DC
  Administered 2020-09-30: 0.5 ug/kg/min via INTRAVENOUS
  Filled 2020-09-30: qty 50

## 2020-09-30 MED ORDER — ACETAMINOPHEN 325 MG PO TABS
650.0000 mg | ORAL_TABLET | ORAL | Status: DC | PRN
  Administered 2020-10-01 (×3): 650 mg
  Filled 2020-09-30 (×2): qty 2

## 2020-09-30 NOTE — Progress Notes (Signed)
[facility] Progress Note Patient Name: Javier Peterson DOB: 1946-04-12 MRN: [id number]   Date of Service  09/30/2020  HPI/Events of Note  Norepinephrine order disappeared from medication list.   eICU Interventions  Will reorder Norepinephrine IV infusion. Titrate to MAP >= 65.         Kizer Nobbe Cornelia Copa 09/30/2020, 11:21 PM

## 2020-09-30 NOTE — Progress Notes (Signed)
OT Cancellation Note  Patient Details Name: Javier Peterson MRN: [id number] DOB: [birth year]   Cancelled Treatment:    Reason Eval/Treat Not Completed: Patient not medically ready (Intubated. FiO2 100%. [id number])  [place], OT/L   Acute OT Clinical Specialist Acute Rehabilitation Services Pager [phone] Office [phone]  09/30/2020, 6:41 AM

## 2020-09-30 NOTE — Progress Notes (Signed)
NAME:  Javier Peterson, MRN:  [id number], DOB:  22-Dec-1945, LOS: 79 ADMISSION DATE:  09/27/2020, CONSULTATION DATE:  12/15 REFERRING MD:  Tana Coast, CHIEF COMPLAINT:  Respiratory failure and COVID    Brief History    74 year old nonvaccinated male admitted 12/7 with fever and dyspnea.  Also cough as well as syncopal episode at home.  Hypoxic in the emergency room requiring 8 to 9 L via nasal cannula to keep sats at 90% Covid positive. Admitted to the internal medicine service therapeutic interventions included the following treatment for COVID-19 infection and associated respiratory failure: Supplemental oxygen initially high flow, this is been persistent and also required addition of nonrebreather, active proning position, IV steroids, remdesivir, as well as 2 doses of Actemra received on 12/9 and 12/10. Over the course of his hospitalization he has remained on high flow oxygen, requiring nonrebreather mask as well as high flow oxygen Ranging from 35 L flow now up to 60 L flow, after some difficulty trying to get out of bed this morning.  Because of high oxygen demand, and tenuous respiratory status pulmonary asked to evaluate for possible transfer to the intensive care   There is no immunization history on file for this patient.   Past Medical History  Glaucoma Not vaccinated  [facility] Events   12/7 admitted started on remdesivir and systemic steroids  12/9: First dose of Actemra  12/10: Second dose of Actemra  12/15 remains on high flow oxygen, as well as nonrebreather.  Had episode this morning requiring titration of oxygen up and took approximately < 1 minute to recover because of this pulmonary asked to evaluate for possible transfer to ICU  12/16 looked okay initially, critical care had signed off  12/17: Rapid response called, patient reporting worsening work of breathing, oxygen titrated up to 60 L heated high flow with 100% pulse ox initially in 81 range.  Moved to  ICU placed on BiPAP.  Work of breathing improved, pulse oximetry stabilized.  Critical care assuming primary care  12/18 remains on CPAP 10, unable to tolerate BiPAP and desaturates on HHFNC and NRB, remains on low dose precedex. FUll CODE  12/19, bump in sCr, borderline hypotensive, starting IVF/ bolus and empiric abx for worsening CXR R> L infiltrates ,. Afebrile Some lower BP overnight, SBP 80's, UOP ok, slight bump in sCr 0.84-> 1.02 -642/ net +563 ml Unable to tolerate off CPAP 10/ 1.0 FiO2 for long, despite HHFNC 60L with NRB, unable to tolerate BIPAP with pressure Remains on precedex  Patient states his breathing is the same, not tired, very thirsty   12./20 aM - -d dimer better. Plateetls worse - 50s. Familhy conversation yesterday - full code. Has easy desaturations. Net neg since admit. Afebrile.  100% FiO2 on BiPAP pulse ox 82%.  Appears significantly fatigued.  12/20 PM -  PM rounds - resp muscule fatigue and intubated . He is limited code.  Hypotensive post intubation,. ECHO with mild RV strain, HAs NEW - DVT - Findings consistent with acute deep vein thrombosis involving the right posterior tibial veins, and right peroneal veins.  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 On 90% fio2, peep 14 -> pulse ox 89%,. Off levophed. On fent gtt alone. Awake and oriented but dysynchronous  Objective   Blood pressure (!) 162/92, pulse (!) 119, temperature 98.5 F (36.9  C), resp. rate 18, height 6\' 2"  (1.88 m), weight 108.3 kg, SpO2 (!) 88 %.    Vent Mode: PRVC FiO2 (%):  [90 %-100 %] 90 % Set Rate:  [28 bmp] 28 bmp Vt Set:  [620 mL] 620 mL PEEP:  [16 cmH20] 16 cmH20 Plateau Pressure:  [24 cmH20-44 cmH20] 24 cmH20   Intake/Output Summary (Last 24 hours) at 09/30/2020 1004 Last data filed at 09/30/2020 1000 Gross per 24 hour  Intake 3137.14 ml  Output 1038 ml  Net 2099.14 ml   Filed Weights   09/19/2020 2025 09/18/20 0234 09/30/20 0415  Weight: 122.5 kg 119.5 kg 108.3 kg     General Appearance:  Looks criticall ill. On vent Head:  Normocephalic, without obvious abnormality, atraumatic Eyes:  PERRL - yes, conjunctiva/corneas - muddy     Ears:  Normal external ear canals, both ears Nose:  G tube - no Throat:  ETT TUBE - yes , OG tube - yes Neck:  Supple,  No enlargement/tenderness/nodules Lungs: Clear to auscultation bilaterally, Ventilator   Synchrony - NO, 90% fio2, peep 14 with a line Heart:  S1 and S2 normal, no murmur, CVP - no.  Pressors - no Abdomen:  Soft, no masses, no organomegaly Genitalia / Rectal:  Not done Extremities:  Extremities- a line + but intact Skin:  ntact in exposed areas . Sacral area - not examined Neurologic:  Sedation - fent gtt -> RASS - -1 . Moves all 4s - yes. CAM-ICU - neg . Orientation - full oriented and understands what is going on   Resolved Hospital Problem list     Assessment & Plan:  Acute hypoxic respiratory failure in the setting of Covid pneumonia and ARDS -unable to get CT chest due to instability. Sp itubation 09/29/20  -Completed remdesivir -Completed Actemra, last dose 12/10 -Has received systemic steroids   09/30/2020 - > does not meet criteria for SBT/Extubation in setting of Acute Respiratory Failure due to ARDS and 90% fio2/peep14. S/p TPA for empiric for PE   Plan - ARDS net protocol Deep sedatation > RASS goal -4/-5 to promote vent synchrony (see below) - contnue fent gtt  - add  precedex gtt  - Roc prn if still syncrhonlyus - will need to ensure off precedex for t hat  - If needed start nmbex -  Prone based on course  - Maintain O2 saturations for goal SpO2 > 85% - continue zinc, vit C - prn albuterol HFA - continue steroids 40 mg daily   - Argatroban for DVT/empiric PE (till HITT panel comes back)  Rule out sepsis   -Had worsening infiltrates 09/28/2020.  Empiric antibiotic treatment started.  On 09/29/2020 and 12/21 no evidence of sepsis/pneumonia . Afebrile since 12/17   Plan -await trach aspirate 09/29/20 pm -dc ceftriaxone for now  Circulatory shock   - +ve post intubation 09/29/20 and off pressors 09/30/20  Plan  - dc levophed off MAR  - fluid bolus via PIV - MAP goal > 65    At risk for AKI   09/30/2020 creat holding  plan - strict  UOP/ trend renal indices/ MAP goal > 65  Thrombocytopenia  -  167-> 111-> 84 -> 50s on 12/20 and Lovenox stopped and DTI started (HITT score 6-7) -> 42 on 12/21 - med review does not show anhy mediation to reduce platelets other than 1-2% with ppi   Plan -await HIT panel -monitor for bleeding (currently none)  Transaminitis  09/30/2020  - improving  Plan  - monitor  Best practice (evaluated daily)   Diet: TF via OG since 12/20 Pain/Anxiety/Delirium protocol (if indicated): Not indicated VAP protocol (if indicated): YES since 12/20 DVT prophylaxis:  DTI since 09/29/20 pending HITT panel GI prophylaxis:  PPI Glucose control: stable- continue to monitor q 4 while on steroids  Mobility: Bed rest   GOALS OF CARE     Multi-Disciplinary Goals of Care Discussion Date of Discussion 09/26/2020 an 12/19  09/30/2020   Primary service for patient     Location of discussion     Family and Staff present Marni Griffon, patient and wife Dr Lamonte Sakai on phone with wife Judeen Hammans Dr Chase Caller, April RN, wife and daughter  Summary of discussion Reversed code status to full code including short term 5-7 days  aggressive ventilator care if needed nlcuing pressors but not long term support and no HD Full code. Intubate + if needed Wife says she is married him to 30  Years and will be difficult to see him on ventilator though it might be c/w goals his care -> l;ater at bedside with wife and daughter -> wife says they are devout christians and know of people esp 1 other devoute christian who was on vent for 55 days and survived. They undersand such miracle is not possible always but wife believes patient sees parallel with that . In addition, h eis a fighter and previiously well and he is afraid of leaving wife behind without a  Fight. She did says no CPR if he arrests and is not interested in prolonged mech ventilato  Followup goals of care due by   Wife invited to bedside - d/w April charge RN  Misc comments if any   No CPR. If he codes during intubation -> no CPR - d/w Wife and daughter and they affirmed it        Status: Full code  Disposition: ICU     ATTESTATION & SIGNATURE   The patient CLAY SOLUM is critically ill with multiple organ systems failure and requires high complexity decision making for assessment and support, frequent evaluation and titration of therapies, application of advanced monitoring technologies and extensive interpretation of multiple databases.   Critical Care Time devoted to patient care services described in this note is  45  Minutes. This time reflects time of care of this signee Dr Brand Males. This critical care time does not reflect procedure time, or teaching time or supervisory time of PA/NP/Med student/Med Resident etc but could involve care discussion time     Dr. Brand Males, M.D., [facility].C.P Pulmonary and Critical Care Medicine Staff Physician [facility] Pager: [phone], If no answer or between  15:00h - 7:00h: call [phone]  09/30/2020 10:46 AM     LABS    PULMONARY Recent  Labs  Lab 09/29/20 1730 09/30/20 0508  PHART 7.358 7.336*  PCO2ART 42.9 48.0  PO2ART 79* 87  HCO3 24.2 25.7  TCO2 26 27  O2SAT 95.0 96.0    CBC Recent Labs  Lab 09/29/20  0215 09/29/20 1730 09/29/20 2336 09/30/20 0428 09/30/20 0508  HGB 15.3   < > 15.3 15.4 15.0  HCT 46.4   < > 44.4 46.8 44.0  WBC 14.1*  --  16.0* 15.6*  --   PLT 54*  --  42* 42*  --    < > = values in this interval not displayed.    COAGULATION Recent Labs  Lab 09/24/20 0103 09/25/20 0308 09/29/20 2336  INR 1.2 1.3* 2.0*    CARDIAC  No results for input(s): TROPONINI in the last 168 hours. No results for input(s): PROBNP in the last 168 hours.   CHEMISTRY Recent Labs  Lab 09/24/20 0103 09/25/20 0308 09/26/20 4665 09/28/20 0211 09/29/20 0215 09/29/20 1730 09/30/20 0428 09/30/20 0508  NA 138 136 137 141 142 146* 147* 146*  K 4.3 4.7 4.4 4.4 4.7 4.2 4.6 4.4  CL 102 101 99 104 105  --  110  --   CO2 25 24 25 22 22   --  23  --   GLUCOSE 127* 114* 98 103* 122*  --  112*  --   BUN 31* 30* 28* 45* 46*  --  55*  --   CREATININE 0.85 0.84 0.84 1.02 0.98  --  1.19  --   CALCIUM 8.3* 8.4* 8.5* 8.4* 8.2*  --  8.3*  --   MG 2.6* 2.5*  --  2.7*  --   --  2.8*  --   PHOS  --   --   --  4.5 4.1  --  5.3*  --    Estimated Creatinine Clearance: 72.4 mL/min (by C-G formula based on SCr of 1.19 mg/dL).   LIVER Recent Labs  Lab 09/24/20 0103 09/25/20 0308 09/26/20 9935 09/28/20 0211 09/29/20 0215 09/29/20 2336 09/30/20 0428  AST 97* 87* 81*  --   --   --  57*  ALT 242* 217* 186*  --   --   --  133*  ALKPHOS 104 120 143*  --   --   --  147*  BILITOT 1.1 1.0 0.8  --   --   --  0.8  PROT 6.2* 5.9* 6.3*  --   --   --  5.9*  ALBUMIN 2.9* 2.8* 3.0* 2.7* 2.6*  --  2.8*  INR 1.2 1.3*  --   --   --  2.0*  --      INFECTIOUS Recent Labs  Lab 09/25/20 0308 09/28/20 1421 09/29/20 0215  PROCALCITON <0.10 0.21 0.16     ENDOCRINE CBG (last 3)  Recent Labs    09/29/20 2324 09/30/20 0350  09/30/20 0831  GLUCAP 112* 108* 119*         IMAGING x48h  - image(s) personally visualized  -   highlighted in bold Portable Chest x-ray  Result Date: 09/29/2020 CLINICAL DATA:  Evaluate ET tube and OG tube placement. EXAM: PORTABLE CHEST 1 VIEW COMPARISON:  09/27/2020 FINDINGS: ET tube tip is above the carina. Nasogastric tube tip is below the GE junction. Normal heart size. Diffuse bilateral interstitial and airspace densities are unchanged from previous exam. IMPRESSION: 1. Satisfactory position of ET tube and NG tube. 2. No change in aeration to the lungs compared with previous exam. Electronically Signed   By: Kerby Moors M.D.   On: 09/29/2020 15:06   DG Abd Portable 1V  Result Date: 09/29/2020 CLINICAL DATA:  OG tube placement EXAM: PORTABLE ABDOMEN - 1 VIEW COMPARISON:  None. FINDINGS: The OG tube tip  projects over the gastric body. The tip is pointed distally. IMPRESSION: OG tube projects over the stomach. Electronically Signed   By: Constance Holster M.D.   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 Comparison Study: Previous 09/26/20 Performing Technologist: Vonzell Schlatter RVT  Examination Guidelines: A complete evaluation includes B-mode imaging, spectral Doppler, color Doppler, and power Doppler as needed of all accessible portions of each vessel. Bilateral testing is considered an integral part of a complete examination. Limited examinations for reoccurring indications may be performed as noted. The reflux portion of the exam is performed with the patient in reverse Trendelenburg.  +---------+---------------+---------+-----------+----------+--------------+ RIGHT    CompressibilityPhasicitySpontaneityPropertiesThrombus Aging +---------+---------------+---------+-----------+----------+--------------+ CFV      Full           Yes      Yes                                  +---------+---------------+---------+-----------+----------+--------------+ SFJ      Full                                                        +---------+---------------+---------+-----------+----------+--------------+ FV Prox  Full                                                        +---------+---------------+---------+-----------+----------+--------------+ FV Mid   Full                                                        +---------+---------------+---------+-----------+----------+--------------+ FV DistalFull                                                        +---------+---------------+---------+-----------+----------+--------------+ PFV      Full                                                        +---------+---------------+---------+-----------+----------+--------------+ POP      Full           Yes      Yes                                 +---------+---------------+---------+-----------+----------+--------------+ PTV      None                                                        +---------+---------------+---------+-----------+----------+--------------+  PERO     None                                                        +---------+---------------+---------+-----------+----------+--------------+   +---------+---------------+---------+-----------+----------+--------------+ LEFT     CompressibilityPhasicitySpontaneityPropertiesThrombus Aging +---------+---------------+---------+-----------+----------+--------------+ CFV      Full           Yes      Yes                                 +---------+---------------+---------+-----------+----------+--------------+ SFJ      Full                                                        +---------+---------------+---------+-----------+----------+--------------+ FV Prox  Full                                                         +---------+---------------+---------+-----------+----------+--------------+ FV Mid   Full                                                        +---------+---------------+---------+-----------+----------+--------------+ FV DistalFull                                                        +---------+---------------+---------+-----------+----------+--------------+ PFV      Full                                                        +---------+---------------+---------+-----------+----------+--------------+ POP      Full           Yes      Yes                                 +---------+---------------+---------+-----------+----------+--------------+ PTV      Full                                                        +---------+---------------+---------+-----------+----------+--------------+ PERO     Full                                                        +---------+---------------+---------+-----------+----------+--------------+  Summary: RIGHT: - Findings consistent with acute deep vein thrombosis involving the right posterior tibial veins, and right peroneal veins. - No cystic structure found in the popliteal fossa.  LEFT: - There is no evidence of deep vein thrombosis in the lower extremity.  - No cystic structure found in the popliteal fossa.  *See table(s) above for measurements and observations. Electronically signed by Deitra Mayo MD on 09/29/2020 at 4:33:30 PM.    Final    ECHOCARDIOGRAM LIMITED  Result Date: 09/29/2020    ECHOCARDIOGRAM REPORT   Patient Name:   JAYLEON MCFARLANE Date of Exam: 09/29/2020 Medical Rec #:  [id number]          Height:       74.0 in Accession #:    [id number]         Weight:       263.4 lb Date of Birth:  February 08, 1946          BSA:          2.443 m Patient Age:    61 years           BP:           90/73 mmHg Patient Gender: M                  HR:           59 bpm. Exam Location:  Inpatient Procedure: Cardiac Doppler, Color  Doppler and Limited Echo Indications:    COVID-19.  History:        Patient has no prior history of Echocardiogram examinations.                 Signs/Symptoms:Shortness of Breath; Risk Factors:Sleep Apnea.                 COVID-19.  Sonographer:    Clayton Lefort RDCS (AE) Referring Phys: [id number] [facility]  Sonographer Comments: No subcostal window. Acute respiratory distress R06.03 IMPRESSIONS  1. Limited echo  2. Left ventricular ejection fraction, by estimation, is 55 to 60%. The left ventricle has normal function. The left ventricle has no regional wall motion abnormalities. There is moderate left ventricular hypertrophy. Left ventricular diastolic parameters are consistent with Grade I diastolic dysfunction (impaired relaxation).  3. Right ventricular systolic function is low normal. The right ventricular size is moderately enlarged. Tricuspid regurgitation signal is inadequate for assessing PA pressure.  4. The mitral valve is grossly normal. No evidence of mitral valve regurgitation.  5. The aortic valve is tricuspid. Aortic valve regurgitation is not visualized.  6. Aortic dilatation noted. 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 Comparison(s): No prior Echocardiogram. FINDINGS  Left Ventricle: Left ventricular ejection fraction, by estimation, is 55 to 60%. The left ventricle has normal function. The left ventricle has no regional wall motion abnormalities. The left ventricular internal cavity size was normal in size. There is  moderate left ventricular hypertrophy. Left ventricular diastolic parameters are consistent with Grade I diastolic dysfunction (impaired relaxation). Indeterminate filling pressures. Right Ventricle: The right ventricular size is moderately enlarged. No increase in right ventricular wall thickness. Right ventricular systolic function is low normal. Tricuspid  regurgitation signal is inadequate for assessing PA pressure. Left Atrium: Left atrial size was normal in size. Right Atrium: Right atrial size was normal in size. Pericardium: There is no evidence of pericardial effusion. Mitral Valve:  The mitral valve is grossly normal. No evidence of mitral valve regurgitation. Tricuspid Valve: The tricuspid valve is not well visualized. Tricuspid valve regurgitation is not demonstrated. Aortic Valve: The aortic valve is tricuspid. Aortic valve regurgitation is not visualized. Pulmonic Valve: The pulmonic valve was not well visualized. Pulmonic valve regurgitation is not visualized. Aorta: Aortic dilatation noted. 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 Venous: The inferior vena cava was not well visualized. IAS/Shunts: The interatrial septum was not assessed.  LEFT VENTRICLE PLAX 2D LVIDd:         4.70 cm  Diastology LVIDs:         3.40 cm  LV e' medial:    4.13 cm/s LV PW:         1.40 cm  LV E/e' medial:  12.9 LV IVS:        1.50 cm  LV e' lateral:   5.77 cm/s LVOT diam:     2.80 cm  LV E/e' lateral: 9.2 LVOT Area:     6.16 cm  LEFT ATRIUM         Index LA diam:    3.60 cm 1.47 cm/m   AORTA Ao Root diam: 4.30 cm Ao Asc diam:  4.20 cm MITRAL VALVE MV Area (PHT): 3.12 cm    SHUNTS MV Decel Time: 243 msec    Systemic Diam: 2.80 cm MV E velocity: 53.10 cm/s MV A velocity: 62.60 cm/s MV E/A ratio:  0.85 Lyman Bishop MD Electronically signed by Lyman Bishop MD Signature Date/Time: 09/29/2020/2:36:38 PM    Final    [place] EKG SITE RITE  Result Date: 09/29/2020 If Site Rite image not attached, placement could not be confirmed due to current cardiac rhythm.

## 2020-09-30 NOTE — Progress Notes (Signed)
[place] for Argatroban Indication: DVT, possible PE  Allergies  Allergen Reactions  . Heparin Other (See Comments)    + HIT Panel/DVT    Patient Measurements: Height: 6\' 2"  (188 cm) Weight: 108.3 kg (238 lb 12.1 oz) IBW/kg (Calculated) : 82.2  Vital Signs: Temp: 98.5 F (36.9 C) (12/21 0847) Temp Source: Axillary (12/21 0413) BP: 162/92 (12/21 0800) Pulse Rate: 119 (12/21 0930)  Labs: Recent Labs    09/27/20 2256 09/28/20 0211 09/28/20 1421 09/29/20 0215 09/29/20 1730 09/29/20 2330 09/29/20 2336 09/30/20 0428 09/30/20 0508 09/30/20 1007  HGB  --   --    < > 15.3   < >  --  15.3 15.4 15.0  --   HCT  --   --    < > 46.4   < >  --  44.4 46.8 44.0  --   PLT  --   --    < > 54*  --   --  42* 42*  --   --   APTT  --   --   --   --    < > 32  --  52*  --  54*  LABPROT  --   --   --   --   --   --  21.8*  --   --   --   INR  --   --   --   --   --   --  2.0*  --   --   --   HEPRLOWMOCWT 0.94  --   --   --   --   --   --   --   --   --   CREATININE  --  1.02  --  0.98  --   --   --  1.19  --   --    < > = values in this interval not displayed.    Estimated Creatinine Clearance: 72.4 mL/min (by C-G formula based on SCr of 1.19 mg/dL).   Medical History: Past Medical History:  Diagnosis Date  . Glaucoma   . OSA (obstructive sleep apnea)   . Skin cancer     Assessment: 21 yom presenting COVID-10+ with respiratory distress. PE suspected and patient started on treatment-dose Lovenox but unable to get CTA with patient's unstable status. Lovenox held 12/19 with worsening platelets and HIT Ab ordered (in process). Profile not c/w HITT but has new DVT 09/29/20. HIGH HITT SCORE:  6-7 points. Pharmacy consulted to start direct thrombin inhibitor. Will initiate argatroban therapy - start delayed while patient received alteplase for possible PE. Argatroban initiated when aPTT <80.  Confirmatory aPTT remains therapeutic at 54. No active  bleeding issues reported.  Goal of Therapy:  aPTT 50 to 90 seconds Monitor platelets by anticoagulation protocol: Yes   Plan:  Continue argatroban at 0.58mcg/kg/min Monitor daily aPTT, CBC, s/sx bleeding F/u HIT Ab results   Arturo Morton, PharmD, BCPS Please check AMION for all [place] contact numbers Clinical Pharmacist 09/30/2020 10:51 AM

## 2020-09-30 NOTE — Progress Notes (Signed)
PT Cancellation Note  Patient Details Name: Javier Peterson MRN: [id number] DOB: 09/24/1946   Cancelled Treatment:    Reason Eval/Treat Not Completed: Patient not medically ready (intubated with Fio2 of 100%)   [name] [name] 09/30/2020, 6:37 AM  Bayard Males, PT Acute Rehabilitation Services Pager: [phone] Office: [phone]

## 2020-09-30 NOTE — Progress Notes (Signed)
[place] for Argatroban Indication: DVT, possible PE  Allergies  Allergen Reactions  . Heparin Other (See Comments)    + HIT Panel/DVT    Patient Measurements: Height: 6\' 2"  (188 cm) Weight: 108.3 kg (238 lb 12.1 oz) IBW/kg (Calculated) : 82.2  Vital Signs: Temp: 97.9 F (36.6 C) (12/21 0413) Temp Source: Axillary (12/21 0413) BP: 119/92 (12/21 0400) Pulse Rate: 106 (12/21 0630)  Labs: Recent Labs    09/27/20 2256 09/28/20 0211 09/28/20 1421 09/29/20 0215 09/29/20 1730 09/29/20 2100 09/29/20 2330 09/29/20 2336 09/30/20 0428 09/30/20 0508  HGB  --   --    < > 15.3   < >  --   --  15.3 15.4 15.0  HCT  --   --    < > 46.4   < >  --   --  44.4 46.8 44.0  PLT  --   --    < > 54*  --   --   --  42* 42*  --   APTT  --   --   --   --   --  31 32  --  52*  --   LABPROT  --   --   --   --   --   --   --  21.8*  --   --   INR  --   --   --   --   --   --   --  2.0*  --   --   HEPRLOWMOCWT 0.94  --   --   --   --   --   --   --   --   --   CREATININE  --  1.02  --  0.98  --   --   --   --  1.19  --    < > = values in this interval not displayed.    Estimated Creatinine Clearance: 72.4 mL/min (by C-G formula based on SCr of 1.19 mg/dL).   Medical History: Past Medical History:  Diagnosis Date  . Glaucoma   . OSA (obstructive sleep apnea)   . Skin cancer       Assessment: 09/29/2020 - Worsening platelets. Profile not c/w HITT but has New DVT 09/29/20. HIGH HITT SCORE - 6-7 points. Pharmacy consulted to start direct thrombin inhibitor.  Will initiate argatroban therapy.  12/21 AM update #1:  Pt was initially to start argatroban earlier this evening, but this was delayed while patient received alteplase for possible PE, plan was to start argatroban when aPTT is <80, aPTT is now 32, ok to start argatroban. Discussed with RN, no current bleeding or oozing issues.   12/21 AM update #2:  APTT is therapeutic   Goal of Therapy:   aPTT 50 to 90 seconds Monitor platelets by anticoagulation protocol: Yes   Plan:  Cont argatroban at 0.38mcg/kg/min Check aPTT at 0900 Check aPTT's q4h until therapeutic x 2, then daily Daily CBC Monitor for bleeding  Narda Bonds, PharmD, BCPS Clinical Pharmacist Phone: [phone]

## 2020-09-30 NOTE — Progress Notes (Signed)
[place] for Argatroban Indication: DVT, possible PE  Allergies  Allergen Reactions  . Heparin Other (See Comments)    + HIT Panel/DVT    Patient Measurements: Height: 6\' 2"  (188 cm) Weight: 119.5 kg (263 lb 7.2 oz) IBW/kg (Calculated) : 82.2  Vital Signs: Temp: 97.4 F (36.3 C) (12/20 2019) Temp Source: Oral (12/20 2019) BP: 120/78 (12/21 0000) Pulse Rate: 96 (12/21 0045)  Labs: Recent Labs    09/27/20 2256 09/28/20 0211 09/28/20 1421 09/29/20 0215 09/29/20 1730 09/29/20 2100 09/29/20 2330 09/29/20 2336  HGB  --   --  16.7 15.3 15.6  --   --  15.3  HCT  --   --  48.1 46.4 46.0  --   --  44.4  PLT  --   --  65* 54*  --   --   --  42*  APTT  --   --   --   --   --  31 32  --   LABPROT  --   --   --   --   --   --   --  21.8*  INR  --   --   --   --   --   --   --  2.0*  HEPRLOWMOCWT 0.94  --   --   --   --   --   --   --   CREATININE  --  1.02  --  0.98  --   --   --   --     Estimated Creatinine Clearance: 92.2 mL/min (by C-G formula based on SCr of 0.98 mg/dL).   Medical History: Past Medical History:  Diagnosis Date  . Glaucoma   . OSA (obstructive sleep apnea)   . Skin cancer       Assessment: 09/29/2020 - Worsening platelets. Profile not c/w HITT but has New DVT 09/29/20. HIGH HITT SCORE - 6-7 points. Pharmacy consulted to start direct thrombin inhibitor.  Will initiate argatroban therapy.  12/21 AM update:  Pt was initially to start argatroban earlier this evening, but this was delayed while patient received alteplase for possible PE, plan was to start argatroban when aPTT is <80, aPTT is now 32, ok to start argatroban. Discussed with RN, no current bleeding or oozing issues.   Goal of Therapy:  aPTT 50 to 90 seconds Monitor platelets by anticoagulation protocol: Yes   Plan:  Start argatroban at 0.1mcg/kg/min Check aPTT at 0500 Check aPTT's q4h until therapeutic x 2, then daily Daily CBC Monitor for  bleeding  Narda Bonds, PharmD, BCPS Clinical Pharmacist Phone: [phone]

## 2020-10-01 ENCOUNTER — Inpatient Hospital Stay (HOSPITAL_COMMUNITY): Payer: Medicare HMO

## 2020-10-01 DIAGNOSIS — R579 Shock, unspecified: Secondary | ICD-10-CM

## 2020-10-01 DIAGNOSIS — J80 Acute respiratory distress syndrome: Secondary | ICD-10-CM | POA: Diagnosis not present

## 2020-10-01 DIAGNOSIS — U071 COVID-19: Secondary | ICD-10-CM | POA: Diagnosis not present

## 2020-10-01 DIAGNOSIS — J9600 Acute respiratory failure, unspecified whether with hypoxia or hypercapnia: Secondary | ICD-10-CM | POA: Diagnosis not present

## 2020-10-01 LAB — COMPREHENSIVE METABOLIC PANEL
ALT: 113 U/L — ABNORMAL HIGH (ref 0–44)
AST: 48 U/L — ABNORMAL HIGH (ref 15–41)
Albumin: 2.5 g/dL — ABNORMAL LOW (ref 3.5–5.0)
Alkaline Phosphatase: 102 U/L (ref 38–126)
Anion gap: 10 (ref 5–15)
BUN: 48 mg/dL — ABNORMAL HIGH (ref 8–23)
CO2: 26 mmol/L (ref 22–32)
Calcium: 8 mg/dL — ABNORMAL LOW (ref 8.9–10.3)
Chloride: 113 mmol/L — ABNORMAL HIGH (ref 98–111)
Creatinine, Ser: 1.07 mg/dL (ref 0.61–1.24)
GFR, Estimated: >60 mL/min (ref >60)
Glucose, Bld: 146 mg/dL — ABNORMAL HIGH (ref 70–99)
Potassium: 4.4 mmol/L (ref 3.5–5.1)
Sodium: 149 mmol/L — ABNORMAL HIGH (ref 135–145)
Total Bilirubin: 0.9 mg/dL (ref 0.3–1.2)
Total Protein: 5.2 g/dL — ABNORMAL LOW (ref 6.5–8.1)

## 2020-10-01 LAB — LACTIC ACID, PLASMA
Lactic Acid, Venous: 3.8 mmol/L (ref 0.5–1.9)
Lactic Acid, Venous: 3.8 mmol/L (ref 0.5–1.9)

## 2020-10-01 LAB — CBC
HCT: 39.9 % (ref 39.0–52.0)
Hemoglobin: 13.4 g/dL (ref 13.0–17.0)
MCH: 33.3 pg (ref 26.0–34.0)
MCHC: 33.6 g/dL (ref 30.0–36.0)
MCV: 99.3 fL (ref 80.0–100.0)
Platelets: 38 10*3/uL — ABNORMAL LOW (ref 150–400)
RBC: 4.02 MIL/uL — ABNORMAL LOW (ref 4.22–5.81)
RDW: 15.1 % (ref 11.5–15.5)
WBC: 16.3 10*3/uL — ABNORMAL HIGH (ref 4.0–10.5)
nRBC: 0 % (ref 0.0–0.2)

## 2020-10-01 LAB — POCT I-STAT 7, (LYTES, BLD GAS, ICA,H+H)
Acid-Base Excess: 3 mmol/L — ABNORMAL HIGH (ref 0.0–2.0)
Acid-Base Excess: 4 mmol/L — ABNORMAL HIGH (ref 0.0–2.0)
Bicarbonate: 29.4 mmol/L — ABNORMAL HIGH (ref 20.0–28.0)
Bicarbonate: 30.2 mmol/L — ABNORMAL HIGH (ref 20.0–28.0)
Calcium, Ion: 1.19 mmol/L (ref 1.15–1.40)
Calcium, Ion: 1.15 mmol/L (ref 1.15–1.40)
HCT: 39 % (ref 39.0–52.0)
HCT: 37 % — ABNORMAL LOW (ref 39.0–52.0)
Hemoglobin: 13.3 g/dL (ref 13.0–17.0)
Hemoglobin: 12.6 g/dL — ABNORMAL LOW (ref 13.0–17.0)
O2 Saturation: 93 %
O2 Saturation: 95 %
Patient temperature: 101.5
Patient temperature: 100.9
Potassium: 5.1 mmol/L (ref 3.5–5.1)
Potassium: 5.2 mmol/L — ABNORMAL HIGH (ref 3.5–5.1)
Sodium: 151 mmol/L — ABNORMAL HIGH (ref 135–145)
Sodium: 151 mmol/L — ABNORMAL HIGH (ref 135–145)
TCO2: 31 mmol/L (ref 22–32)
TCO2: 32 mmol/L (ref 22–32)
pCO2 arterial: 53 mmHg — ABNORMAL HIGH (ref 32.0–48.0)
pCO2 arterial: 56.4 mmHg — ABNORMAL HIGH (ref 32.0–48.0)
pH, Arterial: 7.36 (ref 7.350–7.450)
pH, Arterial: 7.342 — ABNORMAL LOW (ref 7.350–7.450)
pO2, Arterial: 76 mmHg — ABNORMAL LOW (ref 83.0–108.0)
pO2, Arterial: 90 mmHg (ref 83.0–108.0)

## 2020-10-01 LAB — GLUCOSE, CAPILLARY
Glucose-Capillary: 114 mg/dL — ABNORMAL HIGH (ref 70–99)
Glucose-Capillary: 122 mg/dL — ABNORMAL HIGH (ref 70–99)
Glucose-Capillary: 132 mg/dL — ABNORMAL HIGH (ref 70–99)
Glucose-Capillary: 137 mg/dL — ABNORMAL HIGH (ref 70–99)
Glucose-Capillary: 140 mg/dL — ABNORMAL HIGH (ref 70–99)
Glucose-Capillary: 166 mg/dL — ABNORMAL HIGH (ref 70–99)

## 2020-10-01 LAB — PHOSPHORUS: Phosphorus: 3 mg/dL (ref 2.5–4.6)

## 2020-10-01 LAB — URINALYSIS, ROUTINE W REFLEX MICROSCOPIC
Bilirubin Urine: NEGATIVE
Glucose, UA: NEGATIVE mg/dL
Ketones, ur: NEGATIVE mg/dL
Leukocytes,Ua: NEGATIVE
Nitrite: NEGATIVE
Protein, ur: 30 mg/dL — AB
RBC / HPF: >50 RBC/hpf — ABNORMAL HIGH (ref 0–5)
Specific Gravity, Urine: 1.029 (ref 1.005–1.030)
pH: 5 (ref 5.0–8.0)

## 2020-10-01 LAB — APTT: aPTT: 51 seconds — ABNORMAL HIGH (ref 24–36)

## 2020-10-01 LAB — PROCALCITONIN: Procalcitonin: 0.1 ng/mL

## 2020-10-01 LAB — MAGNESIUM: Magnesium: 3 mg/dL — ABNORMAL HIGH (ref 1.7–2.4)

## 2020-10-01 MED ORDER — SODIUM CHLORIDE 0.9 % IV SOLN
2.0000 g | Freq: Three times a day (TID) | INTRAVENOUS | Status: DC
  Administered 2020-10-01 – 2020-10-04 (×10): 2 g via INTRAVENOUS
  Filled 2020-10-01 (×10): qty 2

## 2020-10-01 MED ORDER — VANCOMYCIN HCL 750 MG/150ML IV SOLN
750.0000 mg | Freq: Two times a day (BID) | INTRAVENOUS | Status: DC
  Administered 2020-10-02 – 2020-10-03 (×4): 750 mg via INTRAVENOUS
  Filled 2020-10-01 (×5): qty 150

## 2020-10-01 MED ORDER — LACTATED RINGERS IV BOLUS
1000.0000 mL | Freq: Once | INTRAVENOUS | Status: AC
  Administered 2020-10-01: 1000 mL via INTRAVENOUS

## 2020-10-01 MED ORDER — VANCOMYCIN HCL 2000 MG/400ML IV SOLN
2000.0000 mg | Freq: Once | INTRAVENOUS | Status: AC
  Administered 2020-10-01: 2000 mg via INTRAVENOUS
  Filled 2020-10-01: qty 400

## 2020-10-01 NOTE — Progress Notes (Signed)
eLink Physician-Brief Progress Note Patient Name: Javier Peterson DOB: [birth year] MRN: [id number]   Date of Service  10/01/2020  HPI/Events of Note  eMD hand off to follow CxR and Vent while on Prone.  ZSM:[id number]. ET/lines in place. No Pneumothorax.   Camera: Tolerating prone, sats 97%, P Peak 35 to 38.    eICU Interventions  Continue care. Lung protective ventilation.      Intervention Category Intermediate Interventions: Other:  Elmer Sow 10/01/2020, 7:02 PM

## 2020-10-01 NOTE — Progress Notes (Signed)
NAME:  Javier Peterson, MRN:  [id number], DOB:  02-23-1946, LOS: 3 ADMISSION DATE:  09/11/2020, CONSULTATION DATE:  12/15 REFERRING MD:  Javier Peterson, CHIEF COMPLAINT:  Respiratory failure and COVID    Brief History    74 year old nonvaccinated male admitted 12/7 with fever and dyspnea.  Also cough as well as syncopal episode at home.  Hypoxic in the emergency room requiring 8 to 9 L via nasal cannula to keep sats at 90% Covid positive. Admitted to the internal medicine service therapeutic interventions included the following treatment for COVID-19 infection and associated respiratory failure: Supplemental oxygen initially high flow, this is been persistent and also required addition of nonrebreather, active proning position, IV steroids, remdesivir, as well as 2 doses of Actemra received on 12/9 and 12/10. Over the course of his hospitalization he has remained on high flow oxygen, requiring nonrebreather mask as well as high flow oxygen Ranging from 35 L flow now up to 60 L flow, after some difficulty trying to get out of bed this morning.  Because of high oxygen demand, and tenuous respiratory status pulmonary asked to evaluate for possible transfer to the intensive care   There is no immunization history on file for this patient.   Past Medical History  Glaucoma Not vaccinated  [place] [facility] Events   12/7 admitted started on remdesivir and systemic steroids  12/9: First dose of Actemra  12/10: Second dose of Actemra  12/15 remains on high flow oxygen, as well as nonrebreather.  Had episode this morning requiring titration of oxygen up and took approximately < 1 minute to recover because of this pulmonary asked to evaluate for possible transfer to ICU  12/16 looked okay initially, critical care had signed off  12/17: Rapid response called, patient reporting worsening work of breathing, oxygen titrated up to 60 L heated high flow with 100% pulse ox initially in 81 range.  Moved to  ICU placed on BiPAP.  Work of breathing improved, pulse oximetry stabilized.  Critical care assuming primary care  12/18 remains on CPAP 10, unable to tolerate BiPAP and desaturates on HHFNC and NRB, remains on low dose precedex. FUll CODE  12/19, bump in sCr, borderline hypotensive, starting IVF/ bolus and empiric abx for worsening CXR R> L infiltrates ,. Afebrile Some lower BP overnight, SBP 80's, UOP ok, slight bump in sCr 0.84-> 1.02 -642/ net +563 ml Unable to tolerate off CPAP 10/ 1.0 FiO2 for long, despite HHFNC 60L with NRB, unable to tolerate BIPAP with pressure Remains on precedex  Patient states his breathing is the same, not tired, very thirsty   12./20 aM - -d dimer better. Plateetls worse - 50s. Familhy conversation yesterday - full code. Has easy desaturations. Net neg since admit. Afebrile.  100% FiO2 on BiPAP pulse ox 82%.  Appears significantly fatigued.  12/20 PM -  PM rounds - resp muscule fatigue and intubated . He is limited code.  Hypotensive post intubation,. ECHO with mild RV strain, HAs NEW - DVT - Findings consistent with acute deep vein thrombosis involving the right posterior tibial veins, and right peroneal veins.  Has had PICC  12/21 -  s.p TPA and now on argatroban for confirmed DVT swith suspected HITT and suspected PE. On 90% fio2, peep 14 -> pulse ox 89%,. Off levophed. On fent gtt alone. Awake and oriented but dysynchronous    Consults:  Critical care consulted 12/15  Procedures:    Significant Diagnostic Tests:  CT angiogram 12/15>>>not done LE [place] 12/11: neg  LE [place] 12/17>>> neg for DVT  LE [place] - 12/20 - HAs NEW - DVT - Findings consistent with acute deep vein thrombosis involving the right posterior tibial veins, and right peroneal veins  Micro Data:  12/7 resp panel RT-PCR >> + COVID 12/7 BC x 2 >> neg 12/9 MRSA PCR  >> neg xxxx 12/120 - trach aspirate  Antimicrobials:  Remdesivir completed on 12/12 azithro 12/19 >>12/20 xxxx Ceftriaxone  12/19 (afebrle since 12/17) >>12/20 xxxx vanc 12/22 Cefepime 12/22  Interim history/subjective:   10/01/2020  -supine, on fent gttt, precedex gtt, 80% fio2, peep 14. Not on pressors. No bleeding. On argatroban Platelet low yesterday - still pending result today. HIT Ab - negative.  Making urine.   More synchronous with vent  Has new fever 101F  Results for Javier, Peterson (MRN [id number]) as of 10/01/2020 12:17  Ref. Range 09/29/2020 13:52  Heparin Induced Platelet Ab Latest Ref Range: 0.000 - 0.400 OD 0.312    Objective   Blood pressure 96/68, pulse 76, temperature (!) 101.5 F (38.6 C), temperature source Axillary, resp. rate (!) 29, height 6\' 2"  (1.88 m), weight 108.4 kg, SpO2 94 %.    Vent Mode: PRVC FiO2 (%):  [80 %] 80 % Set Rate:  [28 bmp] 28 bmp Vt Set:  [620 mL] 620 mL PEEP:  [14 cmH20] 14 cmH20 Plateau Pressure:  [22 cmH20-37 cmH20] 31 cmH20   Intake/Output Summary (Last 24 hours) at 10/01/2020 1350 Last data filed at 10/01/2020 1200 Gross per 24 hour  Intake 1359.12 ml  Output 1957.5 ml  Net -598.38 ml   Filed Weights   09/18/20 0234 09/30/20 0415 10/01/20 0116  Weight: 119.5 kg 108.3 kg 108.4 kg    General Appearance:  Looks criticall ill  Head:  Normocephalic, without obvious abnormality, atraumatic Eyes:  PERRL - yes, conjunctiva/corneas - muddy     Ears:  Normal external ear canals, both ears Nose:  G tube - no Throat:  ETT TUBE - yes , OG tube - yes Neck:  Supple,  No enlargement/tenderness/nodules Lungs: Clear to auscultation bilaterally, Ventilator   Synchrony - yes better today, 80% fio2, peep 14 Heart:  S1 and S2 normal, no murmur, CVP - x.  Pressors - no Abdomen:  Soft, no masses, no organomegaly Genitalia / Rectal:  Not done Extremities:  Extremities- intact Skin:  ntact in exposed areas . Sacral area - x Neurologic:  Sedation - Fent gtt, precedex gtt -> RASS - -4      Resolved Hospital Problem list     Assessment & Plan:  Acute  hypoxic respiratory failure in the setting of Covid pneumonia and ARDS -unable to get CT chest due to instability. Sp itubation 09/29/20 and s/p TPA empiric PE 09/29/20  -Completed remdesivir -Completed Actemra, last dose 12/10 -Has received systemic steroids - s/p TPA 09/29/20    10/01/2020 - > does not meet criteria for SBT/Extubation in setting of Acute Respiratory Failure due to ARDS  80% fio2, peep 14 -> PF ratio 95., On argatroban.    Plan - ARDS Measure  - Target TVol 6-8cc/kgIBW  - Target Plateau Pressure < 30cm H20  - Target driving pressure less than 15 cm of water  - Target PaO2 55-65: titrate PEEP/FiO2 per protocol  - As long as PaO2 to FiO2 ratio is less than 1:150 position in prone position for 16 hours a day  - start proning 10/01/2020      Rule out sepsis   -Had worsening infiltrates  09/28/2020.  Empiric antibiotic treatment started.  On 09/29/2020 and 12/21 no evidence of sepsis/pneumonia . Afebrile since 12/17 but has new fever 12./22   Plan - repeat pan culture 12/22 - check sepsis biomarker 12/22 - vanc 12/22 >> - cefepime 12.22   Circulatory shock   - +ve post intubation 09/29/20 and off pressors 09/30/20. Back on levophed  Plan  -levophed +  - fluid bolus via PIV - MAP goal > 65    At risk for AKI  10/01/2020 creat holding  plan - strict UOP/ trend renal indices/ MAP goal > 65  Electrolyte imbalance  10/01/2020 = Na 151  Plan  - continue current free water - escalate if Na is rising  Thrombocytopenia  -  167-> 111-> 84 -> 50s on 12/20 and Lovenox stopped and DTI started (HITT score 6-7) -> 42 on 12/21 - med review does not show anhy mediation to reduce platelets other than 1-2% with ppi   - HIT ab neg  Plan -dc argatroban -monitor for bleeding (currently none)  Transaminitis  10/01/2020  - improving  Plan  - monitor  Best practice (evaluated daily)   Diet: TF via OG since 12/20 Pain/Anxiety/Delirium protocol (if  indicated): Not indicated VAP protocol (if indicated): YES since 12/20 DVT prophylaxis:  scd  GI prophylaxis:  PPI Glucose control: stable- continue to monitor q 4 while on steroids  Mobility: Bed rest   GOALS OF CARE     Multi-Disciplinary Goals of Care Discussion Date of Discussion 09/26/2020 an 12/19  10/01/2020   Primary service for patient     Location of discussion     Family and Staff present Marni Griffon, patient and wife Dr Lamonte Sakai on phone with wife Judeen Hammans Dr Chase Caller, April RN, wife and daughter  Summary of discussion Reversed code status to full code including short term 5-7 days aggressive ventilator care if needed nlcuing pressors but not long term support and no HD Full code. Intubate + if needed Wife says she is married him to 38  Years and will be difficult to see him on ventilator though it might be c/w goals his care -> l;ater at bedside with wife and daughter -> wife says they are devout christians and know of people esp 1 other devoute christian who was on vent for 55 days and survived. They undersand such miracle is not possible always but wife believes patient sees parallel with that . In addition, h eis a fighter and previiously well and he is afraid of leaving wife behind without a  Fight. She did says no CPR if he arrests and is not interested in prolonged mech ventilato  Followup goals of care due by   Wife invited to bedside - d/w April charge RN  Misc comments if any   No CPR. If he codes during intubation -> no CPR - d/w Wife and daughter and they affirmed it        Status: Full code  Disposition: ICU      ATTESTATION & SIGNATURE   The patient Javier Peterson is critically ill with multiple organ systems failure and requires high complexity decision making for assessment and support, frequent evaluation and titration of therapies, application of advanced monitoring technologies and extensive interpretation of multiple databases.   Critical Care  Time devoted to patient care services described in this note is  40  Minutes. This time reflects time of care of this signee Dr Brand Males. This critical care time does not reflect  procedure time, or teaching time or supervisory time of PA/NP/Med student/Med Resident etc but could involve care discussion time     Dr. Brand Males, M.D., [facility].C.P Pulmonary and Critical Care Medicine Staff Physician [facility] Pager: [phone], If no answer or between  15:00h - 7:00h: call [phone]  10/01/2020 2:26 PM      LABS    PULMONARY Recent Labs  Lab 09/29/20 1730 09/30/20 0508 10/01/20 1311  PHART 7.358 7.336* 7.360  PCO2ART 42.9 48.0 53.0*  PO2ART 79* 87 76*  HCO3 24.2 25.7 29.4*  TCO2 26 27 31   O2SAT 95.0 96.0 93.0    CBC Recent Labs  Lab 09/29/20 0215 09/29/20 1730 09/29/20 2336 09/30/20 0428 09/30/20 0508 10/01/20 1311  HGB 15.3   < > 15.3 15.4 15.0 13.3  HCT 46.4   < > 44.4 46.8 44.0 39.0  WBC 14.1*  --  16.0* 15.6*  --   --   PLT 54*  --  42* 42*  --   --    < > = values in this interval not displayed.    COAGULATION Recent Labs  Lab 09/25/20 0308 09/29/20 2336  INR 1.3* 2.0*    CARDIAC  No results for input(s): TROPONINI in the last 168 hours. No results for input(s): PROBNP in the last 168 hours.   CHEMISTRY Recent Labs  Lab 09/25/20 0308 09/26/20 [id number] 09/28/20 0211 09/29/20 0215 09/29/20 1730 09/30/20 0428 09/30/20 0508 10/01/20 0254 10/01/20 1311  NA 136 137 141 142 146* 147* 146* 149* 151*  K 4.7 4.4 4.4 4.7 4.2 4.6 4.4 4.4 5.1  CL 101 99 104 105  --  110  --  113*  --   CO2 24 25 22 22   --  23  --  26  --   GLUCOSE 114* 98 103* 122*  --  112*  --  146*  --   BUN 30* 28* 45* 46*  --  55*  --  48*  --   CREATININE 0.84 0.84 1.02 0.98  --  1.19  --  1.07  --   CALCIUM 8.4* 8.5* 8.4* 8.2*  --  8.3*  --  8.0*  --   MG 2.5*  --  2.7*  --   --  2.8*  --  3.0*  --   PHOS  --    --  4.5 4.1  --  5.3*  --  3.0  --    Estimated Creatinine Clearance: 80.6 mL/min (by C-G formula based on SCr of 1.07 mg/dL).   LIVER Recent Labs  Lab 09/25/20 0308 09/26/20 0714 09/28/20 0211 09/29/20 0215 09/29/20 2336 09/30/20 0428 10/01/20 0254  AST 87* 81*  --   --   --  57* 48*  ALT 217* 186*  --   --   --  133* 113*  ALKPHOS 120 143*  --   --   --  147* 102  BILITOT 1.0 0.8  --   --   --  0.8 0.9  PROT 5.9* 6.3*  --   --   --  5.9* 5.2*  ALBUMIN 2.8* 3.0* 2.7* 2.6*  --  2.8* 2.5*  INR 1.3*  --   --   --  2.0*  --   --      INFECTIOUS Recent Labs  Lab 09/25/20 0308 09/28/20 1421 09/29/20 0215  PROCALCITON <0.10 0.21 0.16     ENDOCRINE CBG (last 3)  Recent Labs    10/01/20 0351 10/01/20 0833 10/01/20 1154  GLUCAP 132* 122* 166*         IMAGING x48h  - image(s) personally visualized  -   highlighted in bold DG CHEST PORT 1 VIEW  Result Date: 10/01/2020 CLINICAL DATA:  Endotracheal tube present.  COVID positive. EXAM: PORTABLE CHEST 1 VIEW COMPARISON:  September 30, 2020. FINDINGS: Endotracheal tube tip in satisfactory position at the level of the clavicular heads. Gastric tube courses below the diaphragm with the tip projecting in expected region of the stomach. The side port appears below the gastroesophageal junction. Slightly improved aeration at the lung bases with persistent diffuse interstitial and patchy airspace opacities. No visible pleural effusions or pneumothorax. Similar cardiomediastinal silhouette. IMPRESSION: Slightly improved aeration at the lung bases with persistent diffuse interstitial and patchy airspace opacities, compatible with multifocal pneumonia. Electronically Signed   By: Margaretha Sheffield MD   On: 10/01/2020 08:05   DG CHEST PORT 1 VIEW  Result Date: 09/30/2020 CLINICAL DATA:  Evaluate ET tube placement.  COVID positive. EXAM: PORTABLE CHEST 1 VIEW COMPARISON:  09/29/2020 FINDINGS: ET tube tip is in satisfactory position  above the carina. There is an NG tube with tip and side port below the GE junction. Right arm PICC line tip is in the distal SVC. Stable cardiomediastinal contours. Persistent and unchanged bilat lower lung zone predominant interstitial and airspace opacities compatible with multifocal pneumonia. IMPRESSION: 1. Stable support apparatus. 2. Persistent bilateral lower lung zone predominant interstitial and airspace opacities compatible with multifocal pneumonia. Electronically Signed   By: Kerby Moors M.D.   On: 09/30/2020 11:44   Portable Chest x-ray  Result Date: 09/29/2020 CLINICAL DATA:  Evaluate ET tube and OG tube placement. EXAM: PORTABLE CHEST 1 VIEW COMPARISON:  09/27/2020 FINDINGS: ET tube tip is above the carina. Nasogastric tube tip is below the GE junction. Normal heart size. Diffuse bilateral interstitial and airspace densities are unchanged from previous exam. IMPRESSION: 1. Satisfactory position of ET tube and NG tube. 2. No change in aeration to the lungs compared with previous exam. Electronically Signed   By: Kerby Moors M.D.   On: 09/29/2020 15:06   DG Abd Portable 1V  Result Date: 09/29/2020 CLINICAL DATA:  OG tube placement EXAM: PORTABLE ABDOMEN - 1 VIEW COMPARISON:  None. FINDINGS: The OG tube tip projects over the gastric body. The tip is pointed distally. IMPRESSION: OG tube projects over the stomach. Electronically Signed   By: Constance Holster M.D.   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 Comparison Study: Previous 09/26/20 Performing Technologist: Vonzell Schlatter RVT  Examination Guidelines: A complete evaluation includes B-mode imaging, spectral Doppler, color Doppler, and power Doppler as needed of all accessible portions of each vessel. Bilateral testing is considered an integral part of a complete examination. Limited examinations for reoccurring indications may be performed  as noted. The reflux portion of the exam is performed with the patient in reverse Trendelenburg.  +---------+---------------+---------+-----------+----------+--------------+ RIGHT    CompressibilityPhasicitySpontaneityPropertiesThrombus Aging +---------+---------------+---------+-----------+----------+--------------+ CFV      Full           Yes      Yes                                 +---------+---------------+---------+-----------+----------+--------------+ SFJ      Full                                                        +---------+---------------+---------+-----------+----------+--------------+  FV Prox  Full                                                        +---------+---------------+---------+-----------+----------+--------------+ FV Mid   Full                                                        +---------+---------------+---------+-----------+----------+--------------+ FV DistalFull                                                        +---------+---------------+---------+-----------+----------+--------------+ PFV      Full                                                        +---------+---------------+---------+-----------+----------+--------------+ POP      Full           Yes      Yes                                 +---------+---------------+---------+-----------+----------+--------------+ PTV      None                                                        +---------+---------------+---------+-----------+----------+--------------+ PERO     None                                                        +---------+---------------+---------+-----------+----------+--------------+   +---------+---------------+---------+-----------+----------+--------------+ LEFT     CompressibilityPhasicitySpontaneityPropertiesThrombus Aging +---------+---------------+---------+-----------+----------+--------------+ CFV      Full            Yes      Yes                                 +---------+---------------+---------+-----------+----------+--------------+ SFJ      Full                                                        +---------+---------------+---------+-----------+----------+--------------+ FV Prox  Full                                                        +---------+---------------+---------+-----------+----------+--------------+  FV Mid   Full                                                        +---------+---------------+---------+-----------+----------+--------------+ FV DistalFull                                                        +---------+---------------+---------+-----------+----------+--------------+ PFV      Full                                                        +---------+---------------+---------+-----------+----------+--------------+ POP      Full           Yes      Yes                                 +---------+---------------+---------+-----------+----------+--------------+ PTV      Full                                                        +---------+---------------+---------+-----------+----------+--------------+ PERO     Full                                                        +---------+---------------+---------+-----------+----------+--------------+     Summary: RIGHT: - Findings consistent with acute deep vein thrombosis involving the right posterior tibial veins, and right peroneal veins. - No cystic structure found in the popliteal fossa.  LEFT: - There is no evidence of deep vein thrombosis in the lower extremity.  - No cystic structure found in the popliteal fossa.  *See table(s) above for measurements and observations. Electronically signed by Deitra Mayo MD on 09/29/2020 at 4:33:30 PM.    Final    [place] EKG SITE RITE  Result Date: 09/29/2020 If Site Rite image not attached, placement could not be confirmed due to current cardiac  rhythm.

## 2020-10-01 NOTE — Progress Notes (Signed)
Pharmacy Antibiotic Note  Javier Peterson is a 74 y.o. male admitted on 10/02/2020 with sepsis.  Pharmacy has been consulted for vancomycin/cefepime dosing. Tmax/24h 101.5, WBC 16.3. SCr 1.07 stable.  Plan: Cefepime 2g IV q8h Vancomycin 2g IV x1; then 750mg  IV q12h Monitor clinical progress, c/s, renal function F/u de-escalation plan/LOT, vancomycin levels as indicated   Height: 6\' 2"  (188 cm) Weight: 108.4 kg (238 lb 15.7 oz) IBW/kg (Calculated) : 82.2  Temp (24hrs), Avg:100.6 F (38.1 C), Min:98.9 F (37.2 C), Max:101.5 F (38.6 C)  Recent Labs  Lab 09/26/20 0714 09/27/20 0620 09/28/20 0211 09/28/20 1421 09/29/20 0215 09/29/20 2336 09/30/20 0428 10/01/20 0254 10/01/20 1046  WBC 13.1*   < >  --  14.2* 14.1* 16.0* 15.6*  --  16.3*  CREATININE 0.84  --  1.02  --  0.98  --  1.19 1.07  --    < > = values in this interval not displayed.    Estimated Creatinine Clearance: 80.6 mL/min (by C-G formula based on SCr of 1.07 mg/dL).    No Active Allergies  Antimicrobials this admission: 12/22 vancomycin >>  12/22 cefepime >>   Dose adjustments this admission:   Microbiology results:   Arturo Morton, PharmD, BCPS Please check AMION for all [place] contact numbers Clinical Pharmacist 10/01/2020 2:35 PM

## 2020-10-01 NOTE — Progress Notes (Signed)
RT secured the ETT 25@lips  with cloth tape. RT turned patient to prone position @ 1550 with RNx4. Patient did have some blood come from his mouth after turning but blood stopped after suctioning when patient was settled. RT will continue to monitor.

## 2020-10-01 NOTE — Progress Notes (Signed)
Cortrak Tube Team Note:  Consult received to place a Cortrak feeding tube.   Arrived to unit to place tube. Per MD plan is to hold off on placement of Cortrak tube today. Patient is high risk for bleeding with procedure and is on argatroban infusion. Patient with functional OGT at this time with tube feeds infusing.   Jacklynn Barnacle, MS, RD, LDN Pager number available on Amion

## 2020-10-01 NOTE — Progress Notes (Signed)
[place] for Argatroban Indication: DVT, possible PE  Allergies  Allergen Reactions  . Heparin Other (See Comments)    + HIT Panel/DVT    Patient Measurements: Height: 6\' 2"  (188 cm) Weight: 108.4 kg (238 lb 15.7 oz) IBW/kg (Calculated) : 82.2  Vital Signs: Temp: 101.2 F (38.4 C) (12/22 0800) Temp Source: Axillary (12/22 0800) BP: 105/72 (12/22 0800) Pulse Rate: 76 (12/22 1030)  Labs: Recent Labs    09/29/20 0215 09/29/20 1730 09/29/20 2336 09/30/20 0428 09/30/20 0508 09/30/20 1007 10/01/20 0254 10/01/20 0911  HGB 15.3   < > 15.3 15.4 15.0  --   --   --   HCT 46.4   < > 44.4 46.8 44.0  --   --   --   PLT 54*  --  42* 42*  --   --   --   --   APTT  --    < >  --  52*  --  54*  --  51*  LABPROT  --   --  21.8*  --   --   --   --   --   INR  --   --  2.0*  --   --   --   --   --   CREATININE 0.98  --   --  1.19  --   --  1.07  --    < > = values in this interval not displayed.    Estimated Creatinine Clearance: 80.6 mL/min (by C-G formula based on SCr of 1.07 mg/dL).   Medical History: Past Medical History:  Diagnosis Date  . Glaucoma   . OSA (obstructive sleep apnea)   . Skin cancer     Assessment: 55 yom presenting COVID-10+ with respiratory distress. PE suspected and patient started on treatment-dose Lovenox but unable to get CTA with patient's unstable status. Lovenox held 12/19 with worsening platelets and HIT Ab ordered (in process). Found to have new DVT 09/29/20. HIGH HITT SCORE:  6-7 points. Pharmacy consulted to start direct thrombin inhibitor. Will initiate argatroban therapy - start delayed while patient received alteplase 12/20 for possible PE. Argatroban initiated when aPTT <80.  aPTT remains therapeutic at 51 but near bottom of range. Hg wnl, plt low but stable at 42 yesterday (still pending for today). No active bleeding issues reported.  Goal of Therapy:  aPTT 50 to 90 seconds Monitor platelets by  anticoagulation protocol: Yes   Plan:  Increase argatroban slightly to 0.55 mcg/kg/min to ensure stay in range Monitor daily aPTT, CBC, s/sx bleeding F/u HIT Ab results - ip   Arturo Morton, PharmD, BCPS Please check AMION for all [place] contact numbers Clinical Pharmacist 10/01/2020 10:42 AM

## 2020-10-01 NOTE — Progress Notes (Signed)
Now prone Lactate 3 when checked for fever 80% fiop2  Plan 1L fluid bolus Continue 16h prone Recheck lactate Await cxr report    SIGNATURE    Dr. Brand Males, M.D., F.C.C.P,  Pulmonary and Critical Care Medicine Staff Physician, [place] Director - Interstitial Lung Disease  Program  Pulmonary [place] [facility] [place], [place], [address]  Pager: [phone], If no answer  OR between  19:00-7:00h: page [phone] Telephone (clinical office): [phone] Telephone (research): [phone]  6:34 PM 10/01/2020

## 2020-10-02 ENCOUNTER — Inpatient Hospital Stay (HOSPITAL_COMMUNITY): Payer: Medicare HMO

## 2020-10-02 DIAGNOSIS — U071 COVID-19: Secondary | ICD-10-CM | POA: Diagnosis not present

## 2020-10-02 DIAGNOSIS — J9600 Acute respiratory failure, unspecified whether with hypoxia or hypercapnia: Secondary | ICD-10-CM | POA: Diagnosis not present

## 2020-10-02 LAB — POCT I-STAT 7, (LYTES, BLD GAS, ICA,H+H)
Acid-Base Excess: 4 mmol/L — ABNORMAL HIGH (ref 0.0–2.0)
Acid-Base Excess: 3 mmol/L — ABNORMAL HIGH (ref 0.0–2.0)
Bicarbonate: 29.2 mmol/L — ABNORMAL HIGH (ref 20.0–28.0)
Bicarbonate: 28.8 mmol/L — ABNORMAL HIGH (ref 20.0–28.0)
Calcium, Ion: 1.14 mmol/L — ABNORMAL LOW (ref 1.15–1.40)
Calcium, Ion: 1.18 mmol/L (ref 1.15–1.40)
HCT: 38 % — ABNORMAL LOW (ref 39.0–52.0)
HCT: 36 % — ABNORMAL LOW (ref 39.0–52.0)
Hemoglobin: 12.9 g/dL — ABNORMAL LOW (ref 13.0–17.0)
Hemoglobin: 12.2 g/dL — ABNORMAL LOW (ref 13.0–17.0)
O2 Saturation: 97 %
O2 Saturation: 99 %
Patient temperature: 98.8
Potassium: 4.9 mmol/L (ref 3.5–5.1)
Potassium: 4.7 mmol/L (ref 3.5–5.1)
Sodium: 149 mmol/L — ABNORMAL HIGH (ref 135–145)
Sodium: 149 mmol/L — ABNORMAL HIGH (ref 135–145)
TCO2: 31 mmol/L (ref 22–32)
TCO2: 30 mmol/L (ref 22–32)
pCO2 arterial: 46 mmHg (ref 32.0–48.0)
pCO2 arterial: 47.9 mmHg (ref 32.0–48.0)
pH, Arterial: 7.412 (ref 7.350–7.450)
pH, Arterial: 7.388 (ref 7.350–7.450)
pO2, Arterial: 90 mmHg (ref 83.0–108.0)
pO2, Arterial: 121 mmHg — ABNORMAL HIGH (ref 83.0–108.0)

## 2020-10-02 LAB — PHOSPHORUS: Phosphorus: 3.5 mg/dL (ref 2.5–4.6)

## 2020-10-02 LAB — COMPREHENSIVE METABOLIC PANEL
ALT: 161 U/L — ABNORMAL HIGH (ref 0–44)
AST: 72 U/L — ABNORMAL HIGH (ref 15–41)
Albumin: 2.4 g/dL — ABNORMAL LOW (ref 3.5–5.0)
Alkaline Phosphatase: 75 U/L (ref 38–126)
Anion gap: 9 (ref 5–15)
BUN: 38 mg/dL — ABNORMAL HIGH (ref 8–23)
CO2: 28 mmol/L (ref 22–32)
Calcium: 7.9 mg/dL — ABNORMAL LOW (ref 8.9–10.3)
Chloride: 112 mmol/L — ABNORMAL HIGH (ref 98–111)
Creatinine, Ser: 0.84 mg/dL (ref 0.61–1.24)
GFR, Estimated: >60 mL/min (ref >60)
Glucose, Bld: 114 mg/dL — ABNORMAL HIGH (ref 70–99)
Potassium: 4.9 mmol/L (ref 3.5–5.1)
Sodium: 149 mmol/L — ABNORMAL HIGH (ref 135–145)
Total Bilirubin: 1 mg/dL (ref 0.3–1.2)
Total Protein: 5.2 g/dL — ABNORMAL LOW (ref 6.5–8.1)

## 2020-10-02 LAB — BLOOD GAS, ARTERIAL
Acid-Base Excess: 2.6 mmol/L — ABNORMAL HIGH (ref 0.0–2.0)
Bicarbonate: 27 mmol/L (ref 20.0–28.0)
Drawn by: 60056
FIO2: 70
O2 Saturation: 92.8 %
Patient temperature: 36.9
pCO2 arterial: 44.7 mmHg (ref 32.0–48.0)
pH, Arterial: 7.398 (ref 7.350–7.450)
pO2, Arterial: 65.2 mmHg — ABNORMAL LOW (ref 83.0–108.0)

## 2020-10-02 LAB — DIC (DISSEMINATED INTRAVASCULAR COAGULATION)PANEL
D-Dimer, Quant: >20 ug/mL-FEU — ABNORMAL HIGH (ref 0.00–0.50)
Fibrinogen: 78 mg/dL — CL (ref 210–475)
INR: 1.7 — ABNORMAL HIGH (ref 0.8–1.2)
Platelets: 29 10*3/uL — CL (ref 150–400)
Prothrombin Time: 19.6 seconds — ABNORMAL HIGH (ref 11.4–15.2)
Smear Review: NONE SEEN
aPTT: 29 seconds (ref 24–36)

## 2020-10-02 LAB — GLUCOSE, CAPILLARY
Glucose-Capillary: 97 mg/dL (ref 70–99)
Glucose-Capillary: 95 mg/dL (ref 70–99)
Glucose-Capillary: 124 mg/dL — ABNORMAL HIGH (ref 70–99)
Glucose-Capillary: 167 mg/dL — ABNORMAL HIGH (ref 70–99)
Glucose-Capillary: 156 mg/dL — ABNORMAL HIGH (ref 70–99)
Glucose-Capillary: 149 mg/dL — ABNORMAL HIGH (ref 70–99)

## 2020-10-02 LAB — APTT: aPTT: 30 seconds (ref 24–36)

## 2020-10-02 LAB — CBC
HCT: 39.5 % (ref 39.0–52.0)
HCT: 39.4 % (ref 39.0–52.0)
Hemoglobin: 12.5 g/dL — ABNORMAL LOW (ref 13.0–17.0)
Hemoglobin: 13.2 g/dL (ref 13.0–17.0)
MCH: 32.3 pg (ref 26.0–34.0)
MCH: 33.4 pg (ref 26.0–34.0)
MCHC: 31.6 g/dL (ref 30.0–36.0)
MCHC: 33.5 g/dL (ref 30.0–36.0)
MCV: 102.1 fL — ABNORMAL HIGH (ref 80.0–100.0)
MCV: 99.7 fL (ref 80.0–100.0)
Platelets: 33 10*3/uL — ABNORMAL LOW (ref 150–400)
Platelets: 31 10*3/uL — ABNORMAL LOW (ref 150–400)
RBC: 3.87 MIL/uL — ABNORMAL LOW (ref 4.22–5.81)
RBC: 3.95 MIL/uL — ABNORMAL LOW (ref 4.22–5.81)
RDW: 15.1 % (ref 11.5–15.5)
RDW: 15.2 % (ref 11.5–15.5)
WBC: 14.7 10*3/uL — ABNORMAL HIGH (ref 4.0–10.5)
WBC: 14.6 10*3/uL — ABNORMAL HIGH (ref 4.0–10.5)
nRBC: 0 % (ref 0.0–0.2)
nRBC: 0 % (ref 0.0–0.2)

## 2020-10-02 LAB — LACTIC ACID, PLASMA
Lactic Acid, Venous: 4.3 mmol/L (ref 0.5–1.9)
Lactic Acid, Venous: 3.7 mmol/L (ref 0.5–1.9)

## 2020-10-02 LAB — PROCALCITONIN: Procalcitonin: <0.1 ng/mL

## 2020-10-02 LAB — MAGNESIUM: Magnesium: 2.8 mg/dL — ABNORMAL HIGH (ref 1.7–2.4)

## 2020-10-02 LAB — SAVE SMEAR(SSMR), FOR PROVIDER SLIDE REVIEW

## 2020-10-02 LAB — TROPONIN I (HIGH SENSITIVITY): Troponin I (High Sensitivity): 66 ng/L — ABNORMAL HIGH (ref <18)

## 2020-10-02 LAB — HEPARIN INDUCED PLATELET AB (HIT ANTIBODY): Heparin Induced Plt Ab: 0.392 OD (ref 0.000–0.400)

## 2020-10-02 MED ORDER — ARTIFICIAL TEARS OPHTHALMIC OINT
1.0000 "application " | TOPICAL_OINTMENT | Freq: Three times a day (TID) | OPHTHALMIC | Status: DC
  Administered 2020-10-02 – 2020-10-09 (×21): 1 via OPHTHALMIC
  Filled 2020-10-02 (×6): qty 3.5

## 2020-10-02 MED ORDER — FENTANYL CITRATE (PF) 100 MCG/2ML IJ SOLN: 25.0000 ug | Freq: Once | INTRAMUSCULAR | Status: DC

## 2020-10-02 MED ORDER — FAMOTIDINE 40 MG/5ML PO SUSR
20.0000 mg | Freq: Every day | ORAL | Status: DC
  Administered 2020-10-02 – 2020-10-08 (×7): 20 mg
  Filled 2020-10-02 (×7): qty 2.5

## 2020-10-02 MED ORDER — MIDAZOLAM HCL 2 MG/2ML IJ SOLN: 4.0000 mg | Freq: Once | INTRAMUSCULAR | Status: AC

## 2020-10-02 MED ORDER — MIDAZOLAM BOLUS VIA INFUSION
1.0000 mg | INTRAVENOUS | Status: DC | PRN
  Administered 2020-10-07: 2 mg via INTRAVENOUS
  Filled 2020-10-02: qty 2

## 2020-10-02 MED ORDER — DOCUSATE SODIUM 50 MG/5ML PO LIQD
100.0000 mg | Freq: Two times a day (BID) | ORAL | Status: DC
  Administered 2020-10-02 – 2020-10-08 (×9): 100 mg
  Filled 2020-10-02 (×10): qty 10

## 2020-10-02 MED ORDER — FENTANYL 2500MCG IN NS 250ML (10MCG/ML) PREMIX INFUSION
25.0000 ug/h | INTRAVENOUS | Status: DC
  Administered 2020-10-02 – 2020-10-05 (×6): 200 ug/h via INTRAVENOUS
  Administered 2020-10-05: 150 ug/h via INTRAVENOUS
  Administered 2020-10-06 – 2020-10-09 (×7): 200 ug/h via INTRAVENOUS
  Filled 2020-10-02 (×15): qty 250

## 2020-10-02 MED ORDER — PIVOT 1.5 CAL PO LIQD
1000.0000 mL | ORAL | Status: DC
  Administered 2020-10-02 – 2020-10-08 (×6): 1000 mL
  Filled 2020-10-02 (×14): qty 1000

## 2020-10-02 MED ORDER — MIDAZOLAM 50MG/50ML (1MG/ML) PREMIX INFUSION
0.0000 mg/h | INTRAVENOUS | Status: DC
  Administered 2020-10-02: 2 mg/h via INTRAVENOUS
  Administered 2020-10-02 – 2020-10-04 (×5): 4 mg/h via INTRAVENOUS
  Administered 2020-10-04: 8 mg/h via INTRAVENOUS
  Administered 2020-10-05: 3 mg/h via INTRAVENOUS
  Administered 2020-10-05: 6 mg/h via INTRAVENOUS
  Administered 2020-10-06: 3 mg/h via INTRAVENOUS
  Administered 2020-10-06: 6 mg/h via INTRAVENOUS
  Administered 2020-10-07: 3 mg/h via INTRAVENOUS
  Administered 2020-10-08: 7 mg/h via INTRAVENOUS
  Administered 2020-10-08 (×2): 6 mg/h via INTRAVENOUS
  Administered 2020-10-08 – 2020-10-09 (×3): 7 mg/h via INTRAVENOUS
  Filled 2020-10-02 (×18): qty 50

## 2020-10-02 MED ORDER — FENTANYL BOLUS VIA INFUSION
25.0000 ug | INTRAVENOUS | Status: DC | PRN
  Administered 2020-10-05: 25 ug via INTRAVENOUS
  Filled 2020-10-02: qty 25

## 2020-10-02 MED ORDER — POLYETHYLENE GLYCOL 3350 17 G PO PACK
17.0000 g | PACK | Freq: Every day | ORAL | Status: DC
  Administered 2020-10-03 – 2020-10-04 (×2): 17 g
  Filled 2020-10-02 (×3): qty 1

## 2020-10-02 MED ORDER — SODIUM CHLORIDE 0.9 % IV SOLN
0.0000 ug/kg/min | INTRAVENOUS | Status: AC
  Administered 2020-10-02 – 2020-10-04 (×5): 3 ug/kg/min via INTRAVENOUS
  Filled 2020-10-02 (×7): qty 20

## 2020-10-02 MED ORDER — CISATRACURIUM BOLUS VIA INFUSION
10.0000 mg | Freq: Once | INTRAVENOUS | Status: AC
  Administered 2020-10-02: 10 mg via INTRAVENOUS
  Filled 2020-10-02: qty 10

## 2020-10-02 MED ORDER — MIDAZOLAM HCL 2 MG/2ML IJ SOLN
INTRAMUSCULAR | Status: AC
  Administered 2020-10-02: 4 mg via INTRAVENOUS
  Filled 2020-10-02: qty 4

## 2020-10-02 NOTE — Progress Notes (Addendum)
NAME:  Javier Peterson, MRN:  [id number], DOB:  1946/04/06, LOS: 68 ADMISSION DATE:  09/14/2020, CONSULTATION DATE:  12/15 REFERRING MD:  Tana Coast, CHIEF COMPLAINT:  Respiratory failure and COVID    Brief History    74 year old nonvaccinated male admitted 12/7 with fever and dyspnea.  Also cough as well as syncopal episode at home.  Hypoxic in the emergency room requiring 8 to 9 L via nasal cannula to keep sats at 90% Covid positive. Admitted to the internal medicine service therapeutic interventions included the following treatment for COVID-19 infection and associated respiratory failure: Supplemental oxygen initially high flow, this is been persistent and also required addition of nonrebreather, active proning position, IV steroids, remdesivir, as well as 2 doses of Actemra received on 12/9 and 12/10. Over the course of his hospitalization he has remained on high flow oxygen, requiring nonrebreather mask as well as high flow oxygen Ranging from 35 L flow now up to 60 L flow, after some difficulty trying to get out of bed this morning.  Because of high oxygen demand, and tenuous respiratory status pulmonary asked to evaluate for possible transfer to the intensive care   There is no immunization history on file for this patient.   Past Medical History  Glaucoma Not vaccinated  [place] [facility] Events   12/7 admitted started on remdesivir and systemic steroids  12/9: First dose of Actemra  12/10: Second dose of Actemra  12/15 remains on high flow oxygen, as well as nonrebreather.  Had episode this morning requiring titration of oxygen up and took approximately < 1 minute to recover because of this pulmonary asked to evaluate for possible transfer to ICU  12/16 looked okay initially, critical care had signed off  12/17: Rapid response called, patient reporting worsening work of breathing, oxygen titrated up to 60 L heated high flow with 100% pulse ox initially in 81 range.  Moved to  ICU placed on BiPAP.  Work of breathing improved, pulse oximetry stabilized.  Critical care assuming primary care  12/18 remains on CPAP 10, unable to tolerate BiPAP and desaturates on HHFNC and NRB, remains on low dose precedex. FUll CODE  12/19, bump in sCr, borderline hypotensive, starting IVF/ bolus and empiric abx for worsening CXR R> L infiltrates ,. Afebrile Some lower BP overnight, SBP 80's, UOP ok, slight bump in sCr 0.84-> 1.02 -642/ net +563 ml Unable to tolerate off CPAP 10/ 1.0 FiO2 for long, despite HHFNC 60L with NRB, unable to tolerate BIPAP with pressure Remains on precedex  Patient states his breathing is the same, not tired, very thirsty   12./20 aM - -d dimer better. Plateetls worse - 50s. Familhy conversation yesterday - full code. Has easy desaturations. Net neg since admit. Afebrile.  100% FiO2 on BiPAP pulse ox 82%.  Appears significantly fatigued.  12/20 PM -  PM rounds - resp muscule fatigue and intubated . He is limited code.  Hypotensive post intubation,. ECHO with mild RV strain, HAs NEW - DVT - Findings consistent with acute deep vein thrombosis involving the right posterior tibial veins, and right peroneal veins.  Has had PICC  12/21 -  s.p TPA and now on argatroban for confirmed DVT swith suspected HITT and suspected PE. On 90% fio2, peep 14 -> pulse ox 89%,. Off levophed. On fent gtt alone. Awake and oriented but dysynchronous  12/22 - -supine, on fent gttt, precedex gtt, 80% fio2, peep 14. Not on pressors. No bleeding. On argatroban Platelet low yesterday - still pending  result today. HIT Ab - negative.  Making urine. More synchronous with vent. Has new fever 101F   Consults:  Critical care consulted 12/15  Procedures:    Significant Diagnostic Tests:  CT angiogram 12/15>>>not done LE [place] 12/11: neg LE [place] 12/17>>> neg for DVT  LE [place] - 12/20 - HAs NEW - DVT - Findings consistent with acute deep vein thrombosis involving the right posterior tibial veins,  and right peroneal veins  Micro Data:  12/7 resp panel RT-PCR >> + COVID 12/7 BC x 2 >> neg 12/9 MRSA PCR  >> neg xxxx 12/120 - trach aspirate  Antimicrobials:  Remdesivir completed on 12/12 azithro 12/19 >>12/20 xxxx Ceftriaxone 12/19 (afebrle since 12/17) >>12/20 xxxx vanc 12/22 Cefepime 12/22  Interim history/subjective:   10/02/2020  - > fever appears to be responding to antibiotics.  Patient completed prone positioning at this morning was turned to supine.  He still on 90% FiO2 and a PEEP of 14.  He is on fentanyl infusion and Precedex infusion.  He is able to nod to voice appropriately.  He did get dyssynchronous with the ventilator and with endotracheal tube cuff reinflation things improved.  Pressors continue  Of argattroban after HIT Ab - neg  Plat worse at 33 Lactate worse - 4.3 PCT normal < 0.1 oh   Results for Javier Peterson, Javier Peterson (MRN [id number]) as of 10/01/2020 12:17  Ref. Range 09/29/2020 13:52  Heparin Induced Platelet Ab Latest Ref Range: 0.000 - 0.400 OD 0.312    Objective   Blood pressure 103/62, pulse 63, temperature 98.8 F (37.1 C), temperature source Axillary, resp. rate (!) 28, height 6\' 2"  (1.88 m), weight 108.4 kg, SpO2 100 %.    Vent Mode: PRVC FiO2 (%):  [70 %-80 %] 70 % Set Rate:  [28 bmp] 28 bmp Vt Set:  [620 mL] 620 mL PEEP:  [14 cmH20] 14 cmH20 Plateau Pressure:  [31 cmH20-36 cmH20] 36 cmH20   Intake/Output Summary (Last 24 hours) at 10/02/2020 1003 Last data filed at 10/02/2020 [id number] Gross per 24 hour  Intake 2923.07 ml  Output 170 ml  Net 2753.07 ml   Filed Weights   09/30/20 0415 10/01/20 0116 10/02/20 0300  Weight: 108.3 kg 108.4 kg 108.4 kg    General Appearance:  Looks criticall il Head:  Normocephalic, without obvious abnormality, atraumatic Eyes:  PERRL - yes, conjunctiva/corneas - chemosis +     Ears:  Normal external ear canals, both ears Nose:  G tube - yes Throat:  ETT TUBE - yes , OG tube - x Neck:  Supple,   No enlargement/tenderness/nodules Lungs: Clear to auscultation bilaterally, Ventilator   Synchrony -dyssynchronous and then improved Heart:  S1 and S2 normal, no murmur, CVP - x.  Pressors - levophed + Abdomen:  Soft, no masses, no organomegaly Genitalia / Rectal:  Not done Extremities:  Extremities- intact Skin:  ntact in exposed areas . Sacral area - zx Neurologic:  Sedation - precedex gtt, fent ttt -> RASS --3 . Moves all 4s - yes. CAM-ICU - x . Orientation - seems aware of what is going on without confusion        Resolved Hospital Problem list     Assessment & Plan:  Acute hypoxic respiratory failure in the setting of Covid pneumonia and ARDS -unable to get CT chest due to instability. Sp itubation 09/29/20 and s/p TPA empiric PE 09/29/20 with confirmed DVT  -Completed remdesivir -Completed Actemra, last dose 12/10 -Has received systemic steroids - s/p TPA  09/29/20 (ssupected PE in setting of DVT and some RV strain)   10/02/2020 - > does not meet criteria for SBT/Extubation in setting of Acute Respiratory Failure due to ARDS. OFf argatroban.  eT tuve very [employer] - advance ET tube and recheck CXR -  ARDS Measure  - Target TVol 6-8cc/kgIBW  - Target Plateau Pressure < 30cm H20  - Target driving pressure less than 15 cm of water  - Target PaO2 55-65: titrate PEEP/FiO2 per protocol  - As long as PaO2 to FiO2 ratio is less than 1:150 position in prone position for 16 hours a day  - started proning 10/01/2020  - Check ABG for PF ratio -> if still < 150 or 120 - > add nimbex with deep sedation     Rule out sepsis   -Had worsening infiltrates 09/28/2020.  Empiric antibiotic treatment started.  On 09/29/2020 and 12/21 no evidence of sepsis/pneumonia . Afebrile since 12/17 but has new fever 12./22  10/02/2020 0 improving fever but concern for DIC  Plan - await pan culture 12/22 - vanc 12/22 >>  - cefepime 12.22 - check DIC panel   Circulatory shock   - +ve  post intubation 09/29/20 and off pressors 09/30/20. Back on levophed as of 12/21 - 12/22  Plan  -levophed +  - fluid bolus via PIV - MAP goal > 65    At risk for AKI  10/02/2020 creat holding  plan - strict UOP/ trend renal indices/ MAP goal > 65  Electrolyte imbalance  10/02/2020 = Na 149 and sme beter  Plan  - continue current free water - escalate if Na is rising  Thrombocytopenia with DVT -  167-> 111-> 84 -> 50s on 12/20 and Lovenox stopped and DTI started (HITT score 6-7) -> 42 on 12/21 - med review does not show anhy mediation to reduce platelets other than 1-2% with ppi   - HIT ab neg -  Off argatroban since 12/222/1  10/02/2020 0- worsening plateelets ? DIC v ppi related  Plan -check DIC panel stat - chagne ppi to h2 blockade -monitor for bleeding (currently none) - no anticoagulation du eto low platlets   Transaminitis  10/02/2020  - improving  Plan  - monitor  Best practice (evaluated daily)   Diet: TF via OG since 12/20 Pain/Anxiety/Delirium protocol (if indicated): Not indicated VAP protocol (if indicated): YES since 12/20 DVT prophylaxis:  scd  GI prophylaxis:  PPI Glucose control: stable- continue to monitor q 4 while on steroids  Mobility: Bed rest   GOALS OF CARE     Multi-Disciplinary Goals of Care Discussion Date of Discussion 09/26/2020 an 12/19  10/02/2020   Primary service for patient     Location of discussion     Family and Staff present Marni Griffon, patient and wife Dr Lamonte Sakai on phone with wife Judeen Hammans Dr Chase Caller, April RN, wife and daughter  Summary of discussion Reversed code status to full code including short term 5-7 days aggressive ventilator care if needed nlcuing pressors but not long term support and no HD Full code. Intubate + if needed Wife says she is married him to 59  Years and will be difficult to see him on ventilator though it might be c/w goals his care -> l;ater at bedside with wife and daughter -> wife says  they are devout christians and know of people esp 1 other devoute christian who was on vent for 55 days and survived. They undersand such miracle is  not possible always but wife believes patient sees parallel with that . In addition, h eis a fighter and previiously well and he is afraid of leaving wife behind without a  Fight. She did says no CPR if he arrests and is not interested in prolonged mech ventilato  Followup goals of care due by   Wife invited to bedside - d/w April charge RN  Misc comments if any   No CPR. If he codes during intubation -> no CPR - d/w Wife and daughter and they affirmed it    Wife and daughter updted 10/02/2020 ->     Status: Full code  Disposition: ICU     ATTESTATION & SIGNATURE   The patient Javier Peterson is critically ill with multiple organ systems failure and requires high complexity decision making for assessment and support, frequent evaluation and titration of therapies, application of advanced monitoring technologies and extensive interpretation of multiple databases.   Critical Care Time devoted to patient care services described in this note is  40  Minutes. This time reflects time of care of this signee Dr Brand Males. This critical care time does not reflect procedure time, or teaching time or supervisory time of PA/NP/Med student/Med Resident etc but could involve care discussion time     Dr. Brand Males, M.D., [facility]C.P Pulmonary and Critical Care Medicine Staff Physician [place] [facility] Pager: [phone], If no answer or between  15:00h - 7:00h: call [phone]  10/02/2020 10:44 AM       LABS    PULMONARY Recent Labs  Lab 09/29/20 1730 09/30/20 0508 10/01/20 1311 10/01/20 1720  PHART 7.358 7.336* 7.360 7.342*  PCO2ART 42.9 48.0 53.0* 56.4*  PO2ART 79* 87 76* 90  HCO3 24.2 25.7 29.4* 30.2*  TCO2 26 27 31  32  O2SAT 95.0 96.0 93.0 95.0    CBC Recent Labs  Lab  09/30/20 0428 09/30/20 0508 10/01/20 1046 10/01/20 1311 10/01/20 1720 10/02/20 0352  HGB 15.4   < > 13.4 13.3 12.6* 12.5*  HCT 46.8   < > 39.9 39.0 37.0* 39.5  WBC 15.6*  --  16.3*  --   --  14.7*  PLT 42*  --  38*  --   --  33*   < > = values in this interval not displayed.    COAGULATION Recent Labs  Lab 09/29/20 2336  INR 2.0*    CARDIAC  No results for input(s): TROPONINI in the last 168 hours. No results for input(s): PROBNP in the last 168 hours.   CHEMISTRY Recent Labs  Lab 09/28/20 0211 09/29/20 0215 09/29/20 1730 09/30/20 0428 09/30/20 0508 10/01/20 0254 10/01/20 1311 10/01/20 1720 10/02/20 0352  NA 141 142   < > 147* 146* 149* 151* 151* 149*  K 4.4 4.7   < > 4.6 4.4 4.4 5.1 5.2* 4.9  CL 104 105  --  110  --  113*  --   --  112*  CO2 22 22  --  23  --  26  --   --  28  GLUCOSE 103* 122*  --  112*  --  146*  --   --  114*  BUN 45* 46*  --  55*  --  48*  --   --  38*  CREATININE 1.02 0.98  --  1.19  --  1.07  --   --  0.84  CALCIUM 8.4* 8.2*  --  8.3*  --  8.0*  --   --  7.9*  MG 2.7*  --   --  2.8*  --  3.0*  --   --  2.8*  PHOS 4.5 4.1  --  5.3*  --  3.0  --   --  3.5   < > = values in this interval not displayed.   Estimated Creatinine Clearance: 102.7 mL/min (by C-G formula based on SCr of 0.84 mg/dL).   LIVER Recent Labs  Lab 09/26/20 0714 09/28/20 0211 09/29/20 0215 09/29/20 2336 09/30/20 0428 10/01/20 0254 10/02/20 0352  AST 81*  --   --   --  57* 48* 72*  ALT 186*  --   --   --  133* 113* 161*  ALKPHOS 143*  --   --   --  147* 102 75  BILITOT 0.8  --   --   --  0.8 0.9 1.0  PROT 6.3*  --   --   --  5.9* 5.2* 5.2*  ALBUMIN 3.0* 2.7* 2.6*  --  2.8* 2.5* 2.4*  INR  --   --   --  2.0*  --   --   --      INFECTIOUS Recent Labs  Lab 09/29/20 0215 10/01/20 1406 10/01/20 1639 10/01/20 2240 10/02/20 0352  LATICACIDVEN  --   --  3.8* 3.8* 4.3*  PROCALCITON 0.16 0.10  --   --  <0.10     ENDOCRINE CBG (last 3)  Recent Labs     10/01/20 2339 10/02/20 0349 10/02/20 0800  GLUCAP 114* 97 95         IMAGING x48h  - image(s) personally visualized  -   highlighted in bold DG Chest Port 1 View  Result Date: 10/02/2020 CLINICAL DATA:  Coronavirus pneumonia.  Ventilator support. EXAM: PORTABLE CHEST 1 VIEW COMPARISON:  10/01/2020 FINDINGS: Endotracheal tube tip 10 cm above the carina. Orogastric or nasogastric tube enters the abdomen. Right arm PICC tip in the SVC above the right atrium. There is somewhat improved aeration in both lower lobes. Widespread pulmonary infiltrates persist. No worsening or new finding. IMPRESSION: 1. Endotracheal tube tip 10 cm above the carina. 2. Persistent widespread pulmonary infiltrates with somewhat improved aeration in both lower lobes. Electronically Signed   By: Nelson Chimes M.D.   On: 10/02/2020 09:32   DG Chest Port 1 View  Result Date: 10/01/2020 CLINICAL DATA:  Evaluate endotracheal tube positioning. EXAM: PORTABLE CHEST 1 VIEW COMPARISON:  October 01, 2020 (5:10 a.m.) FINDINGS: There is stable endotracheal tube, nasogastric tube and right-sided PICC line positioning. Marked severity diffuse bilateral interstitial and patchy airspace opacities are seen. This is unchanged in severity when compared to the prior exam. There is no evidence of a pleural effusion or pneumothorax. The cardiac silhouette is borderline in size. The visualized skeletal structures are unremarkable. IMPRESSION: Stable support line positioning with marked severity diffuse bilateral interstitial and patchy airspace opacities, unchanged in severity when compared to the prior exam. Electronically Signed   By: Virgina Norfolk M.D.   On: 10/01/2020 19:43   DG CHEST PORT 1 VIEW  Result Date: 10/01/2020 CLINICAL DATA:  Endotracheal tube present.  COVID positive. EXAM: PORTABLE CHEST 1 VIEW COMPARISON:  September 30, 2020. FINDINGS: Endotracheal tube tip in satisfactory position at the level of the clavicular heads.  Gastric tube courses below the diaphragm with the tip projecting in expected region of the stomach. The side port appears below the gastroesophageal junction. Slightly improved aeration at the lung  bases with persistent diffuse interstitial and patchy airspace opacities. No visible pleural effusions or pneumothorax. Similar cardiomediastinal silhouette. IMPRESSION: Slightly improved aeration at the lung bases with persistent diffuse interstitial and patchy airspace opacities, compatible with multifocal pneumonia. Electronically Signed   By: Margaretha Sheffield MD   On: 10/01/2020 08:05   DG CHEST PORT 1 VIEW  Result Date: 09/30/2020 CLINICAL DATA:  Evaluate ET tube placement.  COVID positive. EXAM: PORTABLE CHEST 1 VIEW COMPARISON:  09/29/2020 FINDINGS: ET tube tip is in satisfactory position above the carina. There is an NG tube with tip and side port below the GE junction. Right arm PICC line tip is in the distal SVC. Stable cardiomediastinal contours. Persistent and unchanged bilat lower lung zone predominant interstitial and airspace opacities compatible with multifocal pneumonia. IMPRESSION: 1. Stable support apparatus. 2. Persistent bilateral lower lung zone predominant interstitial and airspace opacities compatible with multifocal pneumonia. Electronically Signed   By: Kerby Moors M.D.   On: 09/30/2020 11:44

## 2020-10-02 NOTE — Progress Notes (Signed)
   PF ratio > 150 jsut now  Plan Check abg at 10pm and decide on prone    SIGNATURE    Dr. Brand Males, M.D., F.C.C.P,  Pulmonary and Critical Care Medicine Staff Physician, [place] Director - Interstitial Lung Disease  Program  Pulmonary [place] at [place], [place], [address]  Pager: [phone], If no answer  OR between  19:00-7:00h: page [phone] Telephone (clinical office): [phone] Telephone (research): [phone]  6:38 PM 10/02/2020

## 2020-10-02 NOTE — Procedures (Signed)
Diagnostic Bronchoscopy Procedure Note   Javier Peterson  [id number]  [birth year]  Date:10/02/20  Time:6:30 PM   Provider Performing:Harman Langhans  Procedure: Diagnostic Bronchoscopy (03474)  Indication(s) Assist with direct visualization of tracheostomy placement  Consent Risks of the procedure as well as the alternatives and risks of each were explained to the patient and/or caregiver.  Consent for the procedure was obtained.   Anesthesia See separate tracheostomy note   Time Out Verified patient identification, verified procedure, site/side was marked, verified correct patient position, special equipment/implants available, medications/allergies/relevant history reviewed, required imaging and test results available.   Sterile Technique Usual hand hygiene, masks, gowns, and gloves were used   Procedure Description Bronchoscope advanced through endotracheal tube and into airway.  After suctioning out tracheal secretions, bronchoscope used to provide direct visualization of tracheostomy placement.   Complications/Tolerance None; patient tolerated the procedure well..   EBL None   Specimen(s) None Diagnostic Bronchoscopy Procedure Note   Javier Peterson  [id number]  04-24-1946  Date:10/02/20  Time:6:30 PM   Provider Performing:Maurie Musco  Procedure: Diagnostic Bronchoscopy (64332)  Indication(s)  direct visualization of endotrcheal tube placement  - because cxr was saying ws still high after et tube readjusted. Prior to procedure fio2 increased to 100%  Consent Emergent.    Anesthesia Patient already on deep sedation and nimbex  Time Out Verified patient identification, verified procedure, site/side was marked, verified correct patient position, special equipment/implants available, medications/allergies/relevant history reviewed, required imaging and test results available.   Sterile Technique Usual hand hygiene, masks, gowns, and  gloves were used   Procedure Description Bronchoscope advanced through endotracheal tube and into airway.  Tip of ET tube measured to be 3cm above carina with good return volumes on Vent.   CXR with wrong prediction   Complications/Tolerance None; patient tolerated the procedure well..   EBL None   Specimen(s) None    SIGNATURE    Dr. Brand Males, M.D., F.C.C.P,  Pulmonary and Critical Care Medicine Staff Physician, [place] Director - Interstitial Lung Disease  Program  Pulmonary [place] at [place], [place], [address]  Pager: [phone], If no answer  OR between  19:00-7:00h: page [phone] Telephone (clinical office): [phone] Telephone (research): [phone]  6:32 PM 10/02/2020

## 2020-10-02 NOTE — Progress Notes (Signed)
eLink Physician-Brief Progress Note Patient Name: Javier Peterson DOB: 1946/05/14 MRN: [id number]   Date of Service  10/02/2020  HPI/Events of Note  Patient meets criteria for proning.  eICU Interventions  Nursing communication sent to bedside RN to prone patient.        Kerry Kass Delva Derden 10/02/2020, 11:55 PM

## 2020-10-02 NOTE — Progress Notes (Signed)
Nutrition Follow-up  DOCUMENTATION CODES:   Not applicable  INTERVENTION:   - Plan to exchange OG tube for post-pyloric Cortrak on next available Cortrak service date (10/03/20)  Continue tube feeding via OG tube: - Pivot 1.5 @ 70 ml/hr (1680 ml/day) - Free water flushes 100 ml q 8 hours  Tube feeding regimen provides 2520 kcal, 157 grams of protein, and 1275 ml of H2O.   Total free water with current flushes: 1575 ml  NUTRITION DIAGNOSIS:   Increased nutrient needs related to catabolic illness (COVID+PNA) as evidenced by estimated needs.  Ongoing  GOAL:   Patient will meet greater than or equal to 90% of their needs  Met via TF  MONITOR:   TF tolerance,Vent status  REASON FOR ASSESSMENT:   Consult Enteral/tube feeding initiation and management  ASSESSMENT:   Pt with PMH of not vaccinated and glaucoma admitted 12/7 with COVID+ PNA.  12/07 - admitted, started on remdesivir and steroids 12/17 - transfer to ICU for O2 requirements of 60 L heated high flow, transition to BiPAP 12/20 - cortrak ordered but no service available, started on D5 @ 50 ml/hr, intubated in the afternoon with OG tube access, TF initiated, TPA  Discussed pt with RN and during ICU rounds. Plan is for post-pyloric Cortrak tube placement tomorrow.  Pt now with COVID ARDS. Pt is currently supine but was proned overnight. Per RN, plan is to prone again later this afternoon.  RN reports tube feeding rate was decreased to 10 ml/hr overnight. RN has increased tube feeding back up to goal rate.  Current TF: Pivot 1.5 @ 70 ml/hr, free water flushes 100 ml q 8 hours  Patient is currently intubated on ventilator support MV: 16.8 L/min Temp (24hrs), Avg:99.3 F (37.4 C), Min:97.7 F (36.5 C), Max:100.9 F (38.3 C) BP (a-line): 118/55 MAP (a-line): 72  Drips: Nimbex Precedex Fentanyl Levophed  Medications reviewed and include: vitamin C 500 mg daily, colace, pepcid, SSI q 4 hours, IV  solu-medrol, miralax, zinc sulfate 220 mg daily, IV abx  Labs reviewed: sodium 149, BUN 38, magnesium 2.8, elevated LFTs, lactic acid 4.3 CBG's: 95-166 x 24 hours  UOP: 470 ml x 24 hours I/O's: +4.0 L since admit  Diet Order:   Diet Order    None      EDUCATION NEEDS:   No education needs have been identified at this time  Skin:  Skin Assessment: Skin Integrity Issues: DTI: right heel Stage II: nose  Last BM:  09/28/20  Height:   Ht Readings from Last 1 Encounters:  09/29/20 6' 2"  (1.88 m)    Weight:   Wt Readings from Last 1 Encounters:  10/02/20 108.4 kg    Ideal Body Weight:  86.3 kg  BMI:  Body mass index is 30.68 kg/m.  Estimated Nutritional Needs:   Kcal:  3149-7026  Protein:  150-165 grams  Fluid:  > 2 L/day    Gustavus Bryant, MS, RD, LDN Inpatient Clinical Dietitian Please see AMiON for contact information.

## 2020-10-03 ENCOUNTER — Inpatient Hospital Stay (HOSPITAL_COMMUNITY): Payer: Medicare HMO

## 2020-10-03 DIAGNOSIS — U071 COVID-19: Secondary | ICD-10-CM | POA: Diagnosis not present

## 2020-10-03 DIAGNOSIS — J9600 Acute respiratory failure, unspecified whether with hypoxia or hypercapnia: Secondary | ICD-10-CM | POA: Diagnosis not present

## 2020-10-03 LAB — COMPREHENSIVE METABOLIC PANEL
ALT: 134 U/L — ABNORMAL HIGH (ref 0–44)
AST: 42 U/L — ABNORMAL HIGH (ref 15–41)
Albumin: 2.4 g/dL — ABNORMAL LOW (ref 3.5–5.0)
Alkaline Phosphatase: 72 U/L (ref 38–126)
Anion gap: 5 (ref 5–15)
BUN: 45 mg/dL — ABNORMAL HIGH (ref 8–23)
CO2: 28 mmol/L (ref 22–32)
Calcium: 8 mg/dL — ABNORMAL LOW (ref 8.9–10.3)
Chloride: 114 mmol/L — ABNORMAL HIGH (ref 98–111)
Creatinine, Ser: 0.72 mg/dL (ref 0.61–1.24)
GFR, Estimated: >60 mL/min (ref >60)
Glucose, Bld: 149 mg/dL — ABNORMAL HIGH (ref 70–99)
Potassium: 4.1 mmol/L (ref 3.5–5.1)
Sodium: 147 mmol/L — ABNORMAL HIGH (ref 135–145)
Total Bilirubin: 0.9 mg/dL (ref 0.3–1.2)
Total Protein: 5.1 g/dL — ABNORMAL LOW (ref 6.5–8.1)

## 2020-10-03 LAB — POCT I-STAT 7, (LYTES, BLD GAS, ICA,H+H)
Acid-Base Excess: 6 mmol/L — ABNORMAL HIGH (ref 0.0–2.0)
Bicarbonate: 31.5 mmol/L — ABNORMAL HIGH (ref 20.0–28.0)
Calcium, Ion: 1.24 mmol/L (ref 1.15–1.40)
HCT: 32 % — ABNORMAL LOW (ref 39.0–52.0)
Hemoglobin: 10.9 g/dL — ABNORMAL LOW (ref 13.0–17.0)
O2 Saturation: 83 %
Patient temperature: 94.6
Potassium: 4.8 mmol/L (ref 3.5–5.1)
Sodium: 149 mmol/L — ABNORMAL HIGH (ref 135–145)
TCO2: 33 mmol/L — ABNORMAL HIGH (ref 22–32)
pCO2 arterial: 43.7 mmHg (ref 32.0–48.0)
pH, Arterial: 7.457 — ABNORMAL HIGH (ref 7.350–7.450)
pO2, Arterial: 40 mmHg — CL (ref 83.0–108.0)

## 2020-10-03 LAB — CK TOTAL AND CKMB (NOT AT ARMC)
CK, MB: 4.5 ng/mL (ref 0.5–5.0)
Relative Index: INVALID (ref 0.0–2.5)
Total CK: 44 U/L — ABNORMAL LOW (ref 49–397)

## 2020-10-03 LAB — PROTIME-INR
INR: 1.6 — ABNORMAL HIGH (ref 0.8–1.2)
Prothrombin Time: 18 seconds — ABNORMAL HIGH (ref 11.4–15.2)

## 2020-10-03 LAB — CBC
HCT: 36.8 % — ABNORMAL LOW (ref 39.0–52.0)
HCT: 38.5 % — ABNORMAL LOW (ref 39.0–52.0)
HCT: 36.6 % — ABNORMAL LOW (ref 39.0–52.0)
Hemoglobin: 12.1 g/dL — ABNORMAL LOW (ref 13.0–17.0)
Hemoglobin: 12 g/dL — ABNORMAL LOW (ref 13.0–17.0)
Hemoglobin: 11.7 g/dL — ABNORMAL LOW (ref 13.0–17.0)
MCH: 33.5 pg (ref 26.0–34.0)
MCH: 32.3 pg (ref 26.0–34.0)
MCH: 32.6 pg (ref 26.0–34.0)
MCHC: 32.9 g/dL (ref 30.0–36.0)
MCHC: 31.2 g/dL (ref 30.0–36.0)
MCHC: 32 g/dL (ref 30.0–36.0)
MCV: 101.9 fL — ABNORMAL HIGH (ref 80.0–100.0)
MCV: 103.5 fL — ABNORMAL HIGH (ref 80.0–100.0)
MCV: 101.9 fL — ABNORMAL HIGH (ref 80.0–100.0)
Platelets: 27 10*3/uL — CL (ref 150–400)
Platelets: 24 10*3/uL — CL (ref 150–400)
Platelets: 22 10*3/uL — CL (ref 150–400)
RBC: 3.61 MIL/uL — ABNORMAL LOW (ref 4.22–5.81)
RBC: 3.72 MIL/uL — ABNORMAL LOW (ref 4.22–5.81)
RBC: 3.59 MIL/uL — ABNORMAL LOW (ref 4.22–5.81)
RDW: 15.2 % (ref 11.5–15.5)
RDW: 15.4 % (ref 11.5–15.5)
RDW: 15.3 % (ref 11.5–15.5)
WBC: 10.6 10*3/uL — ABNORMAL HIGH (ref 4.0–10.5)
WBC: 9.8 10*3/uL (ref 4.0–10.5)
WBC: 9.1 10*3/uL (ref 4.0–10.5)
nRBC: 0 % (ref 0.0–0.2)
nRBC: 0 % (ref 0.0–0.2)
nRBC: 0.2 % (ref 0.0–0.2)

## 2020-10-03 LAB — GLUCOSE, CAPILLARY
Glucose-Capillary: 122 mg/dL — ABNORMAL HIGH (ref 70–99)
Glucose-Capillary: 122 mg/dL — ABNORMAL HIGH (ref 70–99)
Glucose-Capillary: 122 mg/dL — ABNORMAL HIGH (ref 70–99)
Glucose-Capillary: 128 mg/dL — ABNORMAL HIGH (ref 70–99)
Glucose-Capillary: 130 mg/dL — ABNORMAL HIGH (ref 70–99)
Glucose-Capillary: 135 mg/dL — ABNORMAL HIGH (ref 70–99)

## 2020-10-03 LAB — LACTIC ACID, PLASMA
Lactic Acid, Venous: 3.5 mmol/L (ref 0.5–1.9)
Lactic Acid, Venous: 3 mmol/L (ref 0.5–1.9)

## 2020-10-03 LAB — PROCALCITONIN
Procalcitonin: <0.1 ng/mL
Procalcitonin: <0.1 ng/mL

## 2020-10-03 LAB — PHOSPHORUS: Phosphorus: 4 mg/dL (ref 2.5–4.6)

## 2020-10-03 LAB — TROPONIN I (HIGH SENSITIVITY)
Troponin I (High Sensitivity): 54 ng/L — ABNORMAL HIGH (ref <18)
Troponin I (High Sensitivity): 63 ng/L — ABNORMAL HIGH (ref <18)

## 2020-10-03 LAB — APTT: aPTT: 25 seconds (ref 24–36)

## 2020-10-03 LAB — VANCOMYCIN, TROUGH: Vancomycin Tr: 6 ug/mL — ABNORMAL LOW (ref 15–20)

## 2020-10-03 MED ORDER — LACTATED RINGERS IV BOLUS
1000.0000 mL | Freq: Once | INTRAVENOUS | Status: AC
  Administered 2020-10-03: 1000 mL via INTRAVENOUS

## 2020-10-03 MED ORDER — VANCOMYCIN HCL IN DEXTROSE 1-5 GM/200ML-% IV SOLN
1000.0000 mg | Freq: Three times a day (TID) | INTRAVENOUS | Status: DC
  Administered 2020-10-03 – 2020-10-04 (×2): 1000 mg via INTRAVENOUS
  Filled 2020-10-03 (×2): qty 200

## 2020-10-03 NOTE — Progress Notes (Signed)
Pt supined at this time with no complications. Pt has small cut on left ear that will not stop bleeding. RN aware. Pt ett secured with commercial tube holder at 27 at the lip per previous settings. No breakdown noted to face. VS within normal limits.

## 2020-10-03 NOTE — Progress Notes (Addendum)
eLink Physician-Brief Progress Note Patient Name: Javier Peterson DOB: [birth year] MRN: [id number]   Date of Service  10/03/2020  HPI/Events of Note  I noted CBC and ABG. Saw him on camera and called and spoke with RN. Supine CXR with no pneumo and plans to prone at 2 am anyway. Until that time, increase the Fio2 to 100 as o2 sat is 83% . Will lower it once proned again. Also check ABG at 3 am  eICU Interventions  Asked RN to call us with those results      Intervention Category Major Interventions: Respiratory failure - evaluation and management  [name] 10/03/2020, 11:09 PM   4:45 am Notified ABG Peripheral o2 sat is 98% proned Gradually decrease fio2 to keep O2 sat 90-91

## 2020-10-03 NOTE — Progress Notes (Signed)
NAME:  Javier Peterson, MRN:  [id number], DOB:  04/24/1946, LOS: 57 ADMISSION DATE:  09/24/2020, CONSULTATION DATE:  12/15 REFERRING MD:  Tana Coast, CHIEF COMPLAINT:  Respiratory failure and COVID    Brief History    74 year old nonvaccinated male admitted 12/7 with fever and dyspnea.  Also cough as well as syncopal episode at home.  Hypoxic in the emergency room requiring 8 to 9 L via nasal cannula to keep sats at 90% Covid positive. Admitted to the internal medicine service therapeutic interventions included the following treatment for COVID-19 infection and associated respiratory failure: Supplemental oxygen initially high flow, this is been persistent and also required addition of nonrebreather, active proning position, IV steroids, remdesivir, as well as 2 doses of Actemra received on 12/9 and 12/10. Over the course of his hospitalization he has remained on high flow oxygen, requiring nonrebreather mask as well as high flow oxygen Ranging from 35 L flow now up to 60 L flow, after some difficulty trying to get out of bed this morning.  Because of high oxygen demand, and tenuous respiratory status pulmonary asked to evaluate for possible transfer to the intensive care   There is no immunization history on file for this patient.   Past Medical History  Glaucoma Not vaccinated  [facility] Events   12/7 admitted started on remdesivir and systemic steroids  12/9: First dose of Actemra  12/10: Second dose of Actemra  12/15 remains on high flow oxygen, as well as nonrebreather.  Had episode this morning requiring titration of oxygen up and took approximately < 1 minute to recover because of this pulmonary asked to evaluate for possible transfer to ICU  12/16 looked okay initially, critical care had signed off  12/17: Rapid response called, patient reporting worsening work of breathing, oxygen titrated up to 60 L heated high flow with 100% pulse ox initially in 81 range.  Moved to  ICU placed on BiPAP.  Work of breathing improved, pulse oximetry stabilized.  Critical care assuming primary care  12/18 remains on CPAP 10, unable to tolerate BiPAP and desaturates on HHFNC and NRB, remains on low dose precedex. FUll CODE  12/19, bump in sCr, borderline hypotensive, starting IVF/ bolus and empiric abx for worsening CXR R> L infiltrates ,. Afebrile Some lower BP overnight, SBP 80's, UOP ok, slight bump in sCr 0.84-> 1.02 -642/ net +563 ml Unable to tolerate off CPAP 10/ 1.0 FiO2 for long, despite HHFNC 60L with NRB, unable to tolerate BIPAP with pressure Remains on precedex  Patient states his breathing is the same, not tired, very thirsty   12./20 aM - -d dimer better. Plateetls worse - 50s. Familhy conversation yesterday - full code. Has easy desaturations. Net neg since admit. Afebrile.  100% FiO2 on BiPAP pulse ox 82%.  Appears significantly fatigued.  12/20 PM -  PM rounds - resp muscule fatigue and intubated . He is limited code.  Hypotensive post intubation,. ECHO with mild RV strain, HAs NEW - DVT - Findings consistent with acute deep vein thrombosis involving the right posterior tibial veins, and right peroneal veins.  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 On 90% fio2, peep 14 -> pulse ox 89%,. Off levophed. On fent gtt alone. Awake and oriented but dysynchronous  12/22 - -supine, on fent gttt, precedex gtt, 80% fio2, peep 14. Not on pressors. No bleeding. On argatroban Platelet low yesterday -  still pending result today. HIT Ab - negative.  Making urine. More synchronous with vent. Has new fever 101F. Argatroban stopped  12/23 - start nimbex. Continue prone/supine ( fever appears to be responding to antibiotics.  Patient completed prone positioning at this morning was turned to supine.  He still on 90% FiO2 and a PEEP of 14.  He is on fentanyl infusion and Precedex infusion.  He is able  to nod to voice appropriately.  He did get dyssynchronous with the ventilator and with endotracheal tube cuff reinflation things improved.)  . HIT AB - negaitive from 09/29/20     Consults:  Critical care consulted 12/15  Procedures:  ETT 12/20 Aline 12/20 PICC 12/20 Bronch for et tube position 12/23  Significant Diagnostic Tests:  CT angiogram 12/15>>>not done LE [place] 12/11: neg LE [place] 12/17>>> neg for DVT  LE [place] - 12/20 - HAs NEW - DVT - Findings consistent with acute deep vein thrombosis involving the right posterior tibial veins, and right peroneal veins  Micro Data:  12/7 resp panel RT-PCR >> + COVID 12/7 BC x 2 >> neg 12/9 MRSA PCR  >> neg xxxx 12/20 - trach aspirate ? Sent 12/22 blood  Antimicrobials:  Remdesivir completed on 12/12 azithro 12/19 >>12/20 xxxx Ceftriaxone 12/19 (afebrle since 12/17) >>12/20 xxxx vanc 12/22 Cefepime 12/22  Interim history/subjective:   10/03/2020  - > lactate 3.5 and stll up. On fent gtt, versed gtt, nimbex gtt. On pressors levophed gtt. Vent -> 60%./pee[p14 On cycle #2 prone currently. Plat continues to drop - 27k. Fever has improved. Bronc yestrday - ET tube 3cm above carina   DIC panel yesterday positive / Schistocyte negative on smear  Objective   Blood pressure 102/66, pulse 70, temperature 98.5 F (36.9 C), temperature source Axillary, resp. rate (!) 28, height 6\' 2"  (1.88 m), weight 108.4 kg, SpO2 99 %.    Vent Mode: PRVC FiO2 (%):  [70 %-90 %] 70 % Set Rate:  [28 bmp] 28 bmp Vt Set:  [620 mL] 620 mL PEEP:  [14 cmH20] 14 cmH20 Plateau Pressure:  [28 cmH20-38 cmH20] 28 cmH20   Intake/Output Summary (Last 24 hours) at 10/03/2020 0723 Last data filed at 10/03/2020 0500 Gross per 24 hour  Intake 1478.81 ml  Output 1265 ml  Net 213.81 ml   Filed Weights   10/01/20 0116 10/02/20 0300 10/03/20 0201  Weight: 108.4 kg 108.4 kg 108.4 kg   General Appearance:  Looks criticall ill . PRONE Head:  Normocephalic, without  obvious abnormality, atraumatic Eyes:  PERRL - not examined, conjunctiva/corneas - not examined     Ears:  Normal external ear canals, both ears Nose:  G tube - yues Throat:  ETT TUBE - yes , OG tube - x Neck:  Supple,  No enlargement/tenderness/nodules Lungs: Clear to auscultation bilaterally, Ventilator   Synchrony - yes, 60% fio2, peep 14 -> pulse ox 96% Heart:  S1 and S2 normal, no murmur, CVP - x.  Pressors - levophed 58mcg -> MAP 80s Abdomen:  Soft, no masses, no organomegaly Genitalia / Rectal:  Not done Extremities:  Extremities- intact Skin:  ntact in exposed areas . Sacral area - flexiseal +. No decub Neurologic:  Sedation - fent gtt, vrsed gtt, nimbex -> RASS - -4, BIS: 40s . Moves all 4s - no. CAM-ICU - x . Orientation - no   Resolved Hospital Problem list     Assessment & Plan:  Acute hypoxic respiratory failure in the setting of Covid pneumonia and  ARDS -unable to get CT chest due to instability. Sp itubation 09/29/20 and s/p TPA empiric PE 09/29/20 with confirmed DVT  -Completed remdesivir -Completed Actemra, last dose 12/10 -Has received systemic steroids - s/p TPA 09/29/20 (ssupected PE in setting of DVT and some RV strain)   10/03/2020 - > does not meet criteria for SBT/Extubation in setting of Acute Respiratory Failure due to ARDS, sedation, nimbex but improved to 60% fio2, peep 14   Plan  -  ARDS Measure  - Target TVol 6-8cc/kgIBW  - Target Plateau Pressure < 30cm H20  - Target driving pressure less than 15 cm of water  - Target PaO2 55-65: titrate PEEP/FiO2 per protocol  - As long as PaO2 to FiO2 ratio is less than 1:150 position in prone position for 16 hours a day  - started proning 10/01/2020, cycle #2 10/02/20   Rule out sepsis   -Had worsening infiltrates 09/28/2020.  Empiric antibiotic treatment started.  On 09/29/2020 and 12/21 no evidence of sepsis/pneumonia . Afebrile since 12/17 but has new fever 12./22  10/03/2020 - improving. afebril x  12h   Plan - await pan culture 12/22 - vanc 12/22 >>  - cefepime 12.22    Circulatory shock   - +ve post intubation 09/29/20 and off pressors 09/30/20. Back on levophed as of 12/21 - 12/22 and ongoing as of 12/24. 20mc with MAP 80s  Plan  -levophed + - asked RN to wean for MAP goal > 65 - add fluid bolus 1L  - fluid bolus via PIV - MAP goal > 65   Lactic acidosis  10/03/2020 - +ve and only slowly improving ? Sepsis related  Plan  - check CK - check PCT  - 1L fluids bolus and reasses   At risk for AKI  10/03/2020 creat holding  plan - strict UOP/ trend renal indices/ MAP goal > 65    Thrombocytopenia - onset 09/26/20 -  - HIT ab neg 12/20.   Off argatroban since 12/222/1 RLE DVT + 12/20 (was negative 12/17)  DIC panel +ve 10/02/20 - Smear schisto negative  10/03/2020 - plat 27k and worse. INR better 1.6  Plan - h2 blockade instead of pppi snice 10/02/20 -monitor for bleeding (currently none) - no anticoagulation du eto low platlets    Electrolyte imbalance  10/03/2020 = Na 147 and sme beter  Plan  - continue current free water - escalate if Na is rising  Transaminitis  10/03/2020  - improving  Plan  - monitor  Best practice (evaluated daily)   Diet: TF via OG since 12/20 Pain/Anxiety/Delirium protocol (if indicated): Not indicated VAP protocol (if indicated): YES since 12/20 DVT prophylaxis:  scd  GI prophylaxis:  PPI Glucose control: stable- continue to monitor q 4 while on steroids  Mobility: Bed rest   GOALS OF CARE     Multi-Disciplinary Goals of Care Discussion Date of Discussion 09/26/2020 an 12/19  09/29/20   Primary service for patient     Location of discussion     Family and Staff present Marni Griffon, patient and wife Dr Lamonte Sakai on phone with wife Judeen Hammans Dr Chase Caller, April RN, wife and daughter  Summary of discussion Reversed code status to full code including short term 5-7 days aggressive ventilator care if needed nlcuing  pressors but not long term support and no HD Full code. Intubate + if needed Wife says she is married him to 84  Years and will be difficult to see him on ventilator though it might  be c/w goals his care -> l;ater at bedside with wife and daughter -> wife says they are devout christians and know of people esp 1 other devoute christian who was on vent for 55 days and survived. They undersand such miracle is not possible always but wife believes patient sees parallel with that . In addition, h eis a fighter and previiously well and he is afraid of leaving wife behind without a  Fight. She did says no CPR if he arrests and is not interested in prolonged mech ventilato  Followup goals of care due by   Wife invited to bedside - d/w April charge RN  Misc comments if any   No CPR. If he codes during intubation -> no CPR - d/w Wife and daughter and they affirmed it    Wife and daughter updted 12/21, 12.22, 12/23, 10/03/2020     Status: Full code  Disposition: ICU     ATTESTATION & SIGNATURE   The patient Javier Peterson is critically ill with multiple organ systems failure and requires high complexity decision making for assessment and support, frequent evaluation and titration of therapies, application of advanced monitoring technologies and extensive interpretation of multiple databases.   Critical Care Time devoted to patient care services described in this note is  40  Minutes. This time reflects time of care of this signee Dr Brand Males. This critical care time does not reflect procedure time, or teaching time or supervisory time of PA/NP/Med student/Med Resident etc but could involve care discussion time     Dr. Brand Males, M.D., [facility].C.P Pulmonary and Critical Care Medicine Staff Physician [facility] Pager: [phone], If no answer or between  15:00h - 7:00h: call [phone]  10/03/2020 8:02 AM       LABS     PULMONARY Recent Labs  Lab 09/30/20 0508 10/01/20 1311 10/01/20 1720 10/02/20 1114 10/02/20 1836 10/02/20 2138  PHART 7.336* 7.360 7.342* 7.412 7.388 7.398  PCO2ART 48.0 53.0* 56.4* 46.0 47.9 44.7  PO2ART 87 76* 90 90 121* 65.2*  HCO3 25.7 29.4* 30.2* 29.2* 28.8* 27.0  TCO2 27 31 32 31 30  --   O2SAT 96.0 93.0 95.0 97.0 99.0 92.8    CBC Recent Labs  Lab 10/02/20 0352 10/02/20 1114 10/02/20 1139 10/02/20 1836 10/03/20 0348  HGB 12.5*   < > 13.2 12.2* 12.1*  HCT 39.5   < > 39.4 36.0* 36.8*  WBC 14.7*  --  14.6*  --  10.6*  PLT 33*  --  29*  31*  --  27*   < > = values in this interval not displayed.    COAGULATION Recent Labs  Lab 09/29/20 2336 10/02/20 1139 10/03/20 0348  INR 2.0* 1.7* 1.6*    CARDIAC  No results for input(s): TROPONINI in the last 168 hours. No results for input(s): PROBNP in the last 168 hours.   CHEMISTRY Recent Labs  Lab 09/28/20 0211 09/29/20 0215 09/29/20 1730 09/30/20 0428 09/30/20 0508 10/01/20 0254 10/01/20 1311 10/01/20 1720 10/02/20 0352 10/02/20 1114 10/02/20 1836 10/03/20 0348  NA 141 142   < > 147*   < > 149*   < > 151* 149* 149* 149* 147*  K 4.4 4.7   < > 4.6   < > 4.4   < > 5.2* 4.9 4.9 4.7 4.1  CL 104 105  --  110  --  113*  --   --  112*  --   --  114*  CO2 22 22  --  23  --  26  --   --  28  --   --  28  GLUCOSE 103* 122*  --  112*  --  146*  --   --  114*  --   --  149*  BUN 45* 46*  --  55*  --  48*  --   --  38*  --   --  45*  CREATININE 1.02 0.98  --  1.19  --  1.07  --   --  0.84  --   --  0.72  CALCIUM 8.4* 8.2*  --  8.3*  --  8.0*  --   --  7.9*  --   --  8.0*  MG 2.7*  --   --  2.8*  --  3.0*  --   --  2.8*  --   --   --   PHOS 4.5 4.1  --  5.3*  --  3.0  --   --  3.5  --   --  4.0   < > = values in this interval not displayed.   Estimated Creatinine Clearance: 107.8 mL/min (by C-G formula based on SCr of 0.72 mg/dL).   LIVER Recent Labs  Lab 09/29/20 0215 09/29/20 2336 09/30/20 0428  10/01/20 0254 10/02/20 0352 10/02/20 1139 10/03/20 0348  AST  --   --  57* 48* 72*  --  42*  ALT  --   --  133* 113* 161*  --  134*  ALKPHOS  --   --  147* 102 75  --  72  BILITOT  --   --  0.8 0.9 1.0  --  0.9  PROT  --   --  5.9* 5.2* 5.2*  --  5.1*  ALBUMIN 2.6*  --  2.8* 2.5* 2.4*  --  2.4*  INR  --  2.0*  --   --   --  1.7* 1.6*     INFECTIOUS Recent Labs  Lab 09/29/20 0215 10/01/20 1406 10/01/20 1639 10/02/20 0352 10/02/20 1147 10/03/20 0348  LATICACIDVEN  --   --    < > 4.3* 3.7* 3.5*  PROCALCITON 0.16 0.10  --  <0.10  --   --    < > = values in this interval not displayed.     ENDOCRINE CBG (last 3)  Recent Labs    10/02/20 1952 10/02/20 2324 10/03/20 0334  GLUCAP 156* 149* 122*         IMAGING x48h  - image(s) personally visualized  -   highlighted in bold DG CHEST PORT 1 VIEW  Result Date: 10/02/2020 CLINICAL DATA:  Evaluate endotracheal tube tip. Pulmonary infiltrates. EXAM: PORTABLE CHEST 1 VIEW COMPARISON:  Same day chest radiograph. FINDINGS: Endotracheal tube tip at the level of the inferior clavicular heads, approximately 5.7 cm above the carina. Right PICC with the tip projecting at the inferior SVC. Gastric tube courses below the diaphragm in outside the field of view. Suspected slight worsening of extensive bilateral interstitial airspace opacities. No visible pleural effusions or pneumothorax. Cardiomediastinal silhouette is similar to prior and within normal limits. IMPRESSION: 1. Endotracheal tube tip at the level of the inferior clavicular heads, approximately 5.7 cm above the carina. 2. Suspected slight worsening of extensive bilateral interstitial airspace opacities. Electronically Signed   By: Margaretha Sheffield MD   On: 10/02/2020 11:34   DG Chest Port 1 [address]  Result Date: 10/02/2020 CLINICAL DATA:  Coronavirus pneumonia.  Ventilator support. EXAM: PORTABLE CHEST 1 VIEW COMPARISON:  10/01/2020 FINDINGS: Endotracheal tube tip 10 cm above  the carina. Orogastric or nasogastric tube enters the abdomen. Right arm PICC tip in the SVC above the right atrium. There is somewhat improved aeration in both lower lobes. Widespread pulmonary infiltrates persist. No worsening or new finding. IMPRESSION: 1. Endotracheal tube tip 10 cm above the carina. 2. Persistent widespread pulmonary infiltrates with somewhat improved aeration in both lower lobes. Electronically Signed   By: Nelson Chimes M.D.   On: 10/02/2020 09:32   DG Chest Port 1 View  Result Date: 10/01/2020 CLINICAL DATA:  Evaluate endotracheal tube positioning. EXAM: PORTABLE CHEST 1 VIEW COMPARISON:  October 01, 2020 (5:10 a.m.) FINDINGS: There is stable endotracheal tube, nasogastric tube and right-sided PICC line positioning. Marked severity diffuse bilateral interstitial and patchy airspace opacities are seen. This is unchanged in severity when compared to the prior exam. There is no evidence of a pleural effusion or pneumothorax. The cardiac silhouette is borderline in size. The visualized skeletal structures are unremarkable. IMPRESSION: Stable support line positioning with marked severity diffuse bilateral interstitial and patchy airspace opacities, unchanged in severity when compared to the prior exam. Electronically Signed   By: Virgina Norfolk M.D.   On: 10/01/2020 19:43

## 2020-10-03 NOTE — Progress Notes (Signed)
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  eICU Interventions  Continue to monitor platelet count and transfuse for platelet count < 10 K or active bleeding.        Kerry Kass Arushi Partridge 10/03/2020, 6:36 AM

## 2020-10-03 NOTE — Progress Notes (Signed)
   Patient continues on prone ventilation with paralysis.  FiO2 improved to 50% and PEEP of 12.  Platelets continue to drop however.  Coming off pressors  Plan -Continue supine/prone cycles - > get chest x-ray as soon as supine -Get blood gas when supine -approximately 9 PM today -Recheck CBC -if platelets are precipitously low consider platelet transfusion      SIGNATURE    Dr. Brand Males, M.D., F.C.C.P,  Pulmonary and Critical Care Medicine Staff Physician, [place] Director - Interstitial Lung Disease  Program  Pulmonary [place] [facility] [place], [place], [address]  Pager: [phone], If no answer  OR between  19:00-7:00h: page [phone] Telephone (clinical office): [phone] Telephone (research): [phone]  5:40 PM 10/03/2020

## 2020-10-03 NOTE — Progress Notes (Signed)
Pharmacy Antibiotic Note  Javier Peterson is a 74 y.o. male admitted on 2020/10/11 with sepsis.  Pharmacy was consulted for vancomycin/cefepime dosing.  D#3 of antibiuotic therapy. WBC has normalized. SCr wnl. PCT neg. LA down to 3.   A 12-hour vanc trough was subtherapeutic at 6 today.   Plan: Continue Cefepime 2g IV q8h Increase vancomycin to 1 gm IV Q 8 hours Monitor clinical progress, c/s, renal function F/u de-escalation plan/LOT, vancomycin levels as indicated   Height: 6\' 2"  (188 cm) Weight: 108.4 kg (238 lb 15.7 oz) IBW/kg (Calculated) : 82.2  Temp (24hrs), Avg:96.2 F (35.7 C), Min:94.2 F (34.6 C), Max:98.5 F (36.9 C)  Recent Labs  Lab 09/29/20 0215 09/29/20 2336 09/30/20 0428 10/01/20 0254 10/01/20 1046 10/01/20 1639 10/01/20 2240 10/02/20 0352 10/02/20 1139 10/02/20 1147 10/03/20 0348 10/03/20 0848 10/03/20 1532 10/03/20 1533  WBC 14.1*   < > 15.6*  --  16.3*  --   --  14.7* 14.6*  --  10.6* 9.8  --   --   CREATININE 0.98  --  1.19 1.07  --   --   --  0.84  --   --  0.72  --   --   --   LATICACIDVEN  --   --   --   --   --    < > 3.8* 4.3*  --  3.7* 3.5*  --   --  3.0*  VANCOTROUGH  --   --   --   --   --   --   --   --   --   --   --   --  6*  --    < > = values in this interval not displayed.    Estimated Creatinine Clearance: 107.8 mL/min (by C-G formula based on SCr of 0.72 mg/dL).    No Active Allergies  Antimicrobials this admission: 12/22 vancomycin >>  12/22 cefepime >>   Dose adjustments this admission: 12/24: 12-hr Vanc trough 6 on 750 mg q 12 hours > increased to vanc 1 gm IV Q 8 hours    Microbiology results: 12/22 BCx >> ngtd    Albertina Parr, PharmD., BCPS, BCCCP Clinical Pharmacist Please refer to [facility] for unit-specific pharmacist

## 2020-10-04 ENCOUNTER — Inpatient Hospital Stay (HOSPITAL_COMMUNITY): Payer: Medicare HMO

## 2020-10-04 DIAGNOSIS — D65 Disseminated intravascular coagulation [defibrination syndrome]: Secondary | ICD-10-CM | POA: Diagnosis not present

## 2020-10-04 LAB — CBC
HCT: 36.3 % — ABNORMAL LOW (ref 39.0–52.0)
HCT: 35.7 % — ABNORMAL LOW (ref 39.0–52.0)
Hemoglobin: 11.4 g/dL — ABNORMAL LOW (ref 13.0–17.0)
Hemoglobin: 11 g/dL — ABNORMAL LOW (ref 13.0–17.0)
MCH: 32.6 pg (ref 26.0–34.0)
MCH: 32 pg (ref 26.0–34.0)
MCHC: 31.4 g/dL (ref 30.0–36.0)
MCHC: 30.8 g/dL (ref 30.0–36.0)
MCV: 103.7 fL — ABNORMAL HIGH (ref 80.0–100.0)
MCV: 103.8 fL — ABNORMAL HIGH (ref 80.0–100.0)
Platelets: 23 10*3/uL — CL (ref 150–400)
Platelets: 20 10*3/uL — CL (ref 150–400)
RBC: 3.5 MIL/uL — ABNORMAL LOW (ref 4.22–5.81)
RBC: 3.44 MIL/uL — ABNORMAL LOW (ref 4.22–5.81)
RDW: 15.8 % — ABNORMAL HIGH (ref 11.5–15.5)
RDW: 15.9 % — ABNORMAL HIGH (ref 11.5–15.5)
WBC: 9.4 10*3/uL (ref 4.0–10.5)
WBC: 9.8 10*3/uL (ref 4.0–10.5)
nRBC: 0.2 % (ref 0.0–0.2)
nRBC: 0 % (ref 0.0–0.2)

## 2020-10-04 LAB — POCT I-STAT 7, (LYTES, BLD GAS, ICA,H+H)
Acid-Base Excess: 5 mmol/L — ABNORMAL HIGH (ref 0.0–2.0)
Acid-Base Excess: 4 mmol/L — ABNORMAL HIGH (ref 0.0–2.0)
Acid-Base Excess: 3 mmol/L — ABNORMAL HIGH (ref 0.0–2.0)
Bicarbonate: 31.6 mmol/L — ABNORMAL HIGH (ref 20.0–28.0)
Bicarbonate: 31.2 mmol/L — ABNORMAL HIGH (ref 20.0–28.0)
Bicarbonate: 30.6 mmol/L — ABNORMAL HIGH (ref 20.0–28.0)
Calcium, Ion: 1.27 mmol/L (ref 1.15–1.40)
Calcium, Ion: 1.23 mmol/L (ref 1.15–1.40)
Calcium, Ion: 1.24 mmol/L (ref 1.15–1.40)
HCT: 32 % — ABNORMAL LOW (ref 39.0–52.0)
HCT: 31 % — ABNORMAL LOW (ref 39.0–52.0)
HCT: 33 % — ABNORMAL LOW (ref 39.0–52.0)
Hemoglobin: 10.9 g/dL — ABNORMAL LOW (ref 13.0–17.0)
Hemoglobin: 10.5 g/dL — ABNORMAL LOW (ref 13.0–17.0)
Hemoglobin: 11.2 g/dL — ABNORMAL LOW (ref 13.0–17.0)
O2 Saturation: 99 %
O2 Saturation: 98 %
O2 Saturation: 81 %
Patient temperature: 97.8
Patient temperature: 97.8
Patient temperature: 96.2
Potassium: 4.4 mmol/L (ref 3.5–5.1)
Potassium: 4.3 mmol/L (ref 3.5–5.1)
Potassium: 4.9 mmol/L (ref 3.5–5.1)
Sodium: 150 mmol/L — ABNORMAL HIGH (ref 135–145)
Sodium: 151 mmol/L — ABNORMAL HIGH (ref 135–145)
Sodium: 150 mmol/L — ABNORMAL HIGH (ref 135–145)
TCO2: 33 mmol/L — ABNORMAL HIGH (ref 22–32)
TCO2: 33 mmol/L — ABNORMAL HIGH (ref 22–32)
TCO2: 32 mmol/L (ref 22–32)
pCO2 arterial: 56.2 mmHg — ABNORMAL HIGH (ref 32.0–48.0)
pCO2 arterial: 55.4 mmHg — ABNORMAL HIGH (ref 32.0–48.0)
pCO2 arterial: 56.5 mmHg — ABNORMAL HIGH (ref 32.0–48.0)
pH, Arterial: 7.356 (ref 7.350–7.450)
pH, Arterial: 7.356 (ref 7.350–7.450)
pH, Arterial: 7.335 — ABNORMAL LOW (ref 7.350–7.450)
pO2, Arterial: 159 mmHg — ABNORMAL HIGH (ref 83.0–108.0)
pO2, Arterial: 106 mmHg (ref 83.0–108.0)
pO2, Arterial: 46 mmHg — ABNORMAL LOW (ref 83.0–108.0)

## 2020-10-04 LAB — LACTIC ACID, PLASMA
Lactic Acid, Venous: 2.3 mmol/L (ref 0.5–1.9)
Lactic Acid, Venous: 3.6 mmol/L (ref 0.5–1.9)

## 2020-10-04 LAB — COMPREHENSIVE METABOLIC PANEL
ALT: 107 U/L — ABNORMAL HIGH (ref 0–44)
ALT: 98 U/L — ABNORMAL HIGH (ref 0–44)
AST: 35 U/L (ref 15–41)
AST: 34 U/L (ref 15–41)
Albumin: 2.4 g/dL — ABNORMAL LOW (ref 3.5–5.0)
Albumin: 2.5 g/dL — ABNORMAL LOW (ref 3.5–5.0)
Alkaline Phosphatase: 61 U/L (ref 38–126)
Alkaline Phosphatase: 72 U/L (ref 38–126)
Anion gap: 11 (ref 5–15)
Anion gap: 6 (ref 5–15)
BUN: 43 mg/dL — ABNORMAL HIGH (ref 8–23)
BUN: 42 mg/dL — ABNORMAL HIGH (ref 8–23)
CO2: 28 mmol/L (ref 22–32)
CO2: 30 mmol/L (ref 22–32)
Calcium: 8.1 mg/dL — ABNORMAL LOW (ref 8.9–10.3)
Calcium: 8.3 mg/dL — ABNORMAL LOW (ref 8.9–10.3)
Chloride: 112 mmol/L — ABNORMAL HIGH (ref 98–111)
Chloride: 113 mmol/L — ABNORMAL HIGH (ref 98–111)
Creatinine, Ser: 0.68 mg/dL (ref 0.61–1.24)
Creatinine, Ser: 0.69 mg/dL (ref 0.61–1.24)
GFR, Estimated: >60 mL/min (ref >60)
GFR, Estimated: >60 mL/min (ref >60)
Glucose, Bld: 89 mg/dL (ref 70–99)
Glucose, Bld: 173 mg/dL — ABNORMAL HIGH (ref 70–99)
Potassium: 4.8 mmol/L (ref 3.5–5.1)
Potassium: 5.3 mmol/L — ABNORMAL HIGH (ref 3.5–5.1)
Sodium: 151 mmol/L — ABNORMAL HIGH (ref 135–145)
Sodium: 149 mmol/L — ABNORMAL HIGH (ref 135–145)
Total Bilirubin: 0.8 mg/dL (ref 0.3–1.2)
Total Bilirubin: 0.9 mg/dL (ref 0.3–1.2)
Total Protein: 5 g/dL — ABNORMAL LOW (ref 6.5–8.1)
Total Protein: 5.3 g/dL — ABNORMAL LOW (ref 6.5–8.1)

## 2020-10-04 LAB — FIBRINOGEN: Fibrinogen: 65 mg/dL — CL (ref 210–475)

## 2020-10-04 LAB — MAGNESIUM
Magnesium: 2.8 mg/dL — ABNORMAL HIGH (ref 1.7–2.4)
Magnesium: 2.6 mg/dL — ABNORMAL HIGH (ref 1.7–2.4)

## 2020-10-04 LAB — PROCALCITONIN: Procalcitonin: <0.1 ng/mL

## 2020-10-04 LAB — PROTIME-INR
INR: 1.6 — ABNORMAL HIGH (ref 0.8–1.2)
INR: 1.4 — ABNORMAL HIGH (ref 0.8–1.2)
Prothrombin Time: 18.7 seconds — ABNORMAL HIGH (ref 11.4–15.2)
Prothrombin Time: 16.5 seconds — ABNORMAL HIGH (ref 11.4–15.2)

## 2020-10-04 LAB — GLUCOSE, CAPILLARY
Glucose-Capillary: 109 mg/dL — ABNORMAL HIGH (ref 70–99)
Glucose-Capillary: 121 mg/dL — ABNORMAL HIGH (ref 70–99)
Glucose-Capillary: 126 mg/dL — ABNORMAL HIGH (ref 70–99)
Glucose-Capillary: 133 mg/dL — ABNORMAL HIGH (ref 70–99)
Glucose-Capillary: 134 mg/dL — ABNORMAL HIGH (ref 70–99)
Glucose-Capillary: 154 mg/dL — ABNORMAL HIGH (ref 70–99)

## 2020-10-04 LAB — APTT
aPTT: 26 seconds (ref 24–36)
aPTT: 27 seconds (ref 24–36)

## 2020-10-04 LAB — ABO/RH: ABO/RH(D): A POS

## 2020-10-04 LAB — PHOSPHORUS: Phosphorus: 3.9 mg/dL (ref 2.5–4.6)

## 2020-10-04 MED ORDER — FREE WATER
200.0000 mL | Freq: Three times a day (TID) | Status: DC
  Administered 2020-10-04 – 2020-10-05 (×3): 200 mL

## 2020-10-04 MED ORDER — METHYLPREDNISOLONE SODIUM SUCC 40 MG IJ SOLR
20.0000 mg | INTRAMUSCULAR | Status: DC
  Administered 2020-10-05: 20 mg via INTRAVENOUS
  Filled 2020-10-04: qty 1

## 2020-10-04 MED ORDER — INSULIN ASPART 100 UNIT/ML ~~LOC~~ SOLN
2.0000 [IU] | SUBCUTANEOUS | Status: DC
  Administered 2020-10-04 (×3): 2 [IU] via SUBCUTANEOUS
  Administered 2020-10-04: 4 [IU] via SUBCUTANEOUS
  Administered 2020-10-05: 2 [IU] via SUBCUTANEOUS
  Administered 2020-10-05: 4 [IU] via SUBCUTANEOUS
  Administered 2020-10-06 – 2020-10-08 (×6): 2 [IU] via SUBCUTANEOUS

## 2020-10-04 MED ORDER — SODIUM CHLORIDE 0.9% IV SOLUTION: Freq: Once | INTRAVENOUS | Status: DC

## 2020-10-04 MED ORDER — LACTATED RINGERS IV BOLUS
500.0000 mL | Freq: Once | INTRAVENOUS | Status: AC
  Administered 2020-10-04: 500 mL via INTRAVENOUS

## 2020-10-04 NOTE — Progress Notes (Addendum)
NAME:  Javier Peterson, MRN:  [id number], DOB:  10/19/1945, LOS: 68 ADMISSION DATE:  10/08/2020, CONSULTATION DATE:  12/15 REFERRING MD:  Tana Coast, CHIEF COMPLAINT:  Respiratory failure and COVID    Brief History    74 year old nonvaccinated male admitted 12/7 with fever and dyspnea.  Also cough as well as syncopal episode at home.  Hypoxic in the emergency room requiring 8 to 9 L via nasal cannula to keep sats at 90% Covid positive. Admitted to the internal medicine service therapeutic interventions included the following treatment for COVID-19 infection and associated respiratory failure: Supplemental oxygen initially high flow, this is been persistent and also required addition of nonrebreather, active proning position, IV steroids, remdesivir, as well as 2 doses of Actemra received on 12/9 and 12/10. Over the course of his hospitalization he has remained on high flow oxygen, requiring nonrebreather mask as well as high flow oxygen Ranging from 35 L flow now up to 60 L flow, after some difficulty trying to get out of bed this morning.  Because of high oxygen demand, and tenuous respiratory status pulmonary asked to evaluate for possible transfer to the intensive care   There is no immunization history on file for this patient.   Past Medical History  Glaucoma Not vaccinated  [place] Events   12/7 admitted started on remdesivir and systemic steroids  12/9: First dose of Actemra  12/10: Second dose of Actemra  12/15 remains on high flow oxygen, as well as nonrebreather.  Had episode this morning requiring titration of oxygen up and took approximately < 1 minute to recover because of this pulmonary asked to evaluate for possible transfer to ICU  12/16 looked okay initially, critical care had signed off  12/17: Rapid response called, patient reporting worsening work of breathing, oxygen titrated up to 60 L heated high flow with 100% pulse ox initially in 81 range.  Moved to  ICU placed on BiPAP.  Work of breathing improved, pulse oximetry stabilized.  Critical care assuming primary care  12/18 remains on CPAP 10, unable to tolerate BiPAP and desaturates on HHFNC and NRB, remains on low dose precedex. FUll CODE  12/19, bump in sCr, borderline hypotensive, starting IVF/ bolus and empiric abx for worsening CXR R> L infiltrates ,. Afebrile Some lower BP overnight, SBP 80's, UOP ok, slight bump in sCr 0.84-> 1.02 -642/ net +563 ml Unable to tolerate off CPAP 10/ 1.0 FiO2 for long, despite HHFNC 60L with NRB, unable to tolerate BIPAP with pressure Remains on precedex  Patient states his breathing is the same, not tired, very thirsty   12./20 aM - -d dimer better. Plateetls worse - 50s. Familhy conversation yesterday - full code. Has easy desaturations. Net neg since admit. Afebrile.  100% FiO2 on BiPAP pulse ox 82%.  Appears significantly fatigued.  12/20 PM -  PM rounds - resp muscule fatigue and intubated . He is limited code.  Hypotensive post intubation,. ECHO with mild RV strain, HAs NEW - DVT - Findings consistent with acute deep vein thrombosis involving the right posterior tibial veins, and right peroneal veins.  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 On 90% fio2, peep 14 -> pulse ox 89%,. Off levophed. On fent gtt alone. Awake and oriented but dysynchronous  12/22 - -supine, on fent gttt, precedex gtt, 80% fio2, peep 14. Not on pressors. No bleeding. On argatroban Platelet low yesterday -  still pending result today. HIT Ab - negative.  Making urine. More synchronous with vent. Has new fever 101F. Argatroban stopped  12/23 - start nimbex. Continue prone/supine ( fever appears to be responding to antibiotics.  Patient completed prone positioning at this morning was turned to supine.  He still on 90% FiO2 and a PEEP of 14.  He is on fentanyl infusion and Precedex infusion.  He is able  to nod to voice appropriately.  He did get dyssynchronous with the ventilator and with endotracheal tube cuff reinflation things improved.)  . HIT AB - negaitive from 09/29/20 and 12/21  12/24 -  lactate 3.5 and stll up. On fent gtt, versed gtt, nimbex gtt. On pressors levophed gtt. Vent -> 60%./pee[p14 On cycle #2 prone currently. Plat continues to drop - 27k. Fever has improved. Bronc yestrday - ET tube 3cm above carina. DIC panel yesterday positive / Schistocyte negative on smear   Consults:  Critical care consulted 12/15  Procedures:  ETT 12/20 Aline 12/20 PICC 12/20 Bronch for et tube position 12/23  Significant Diagnostic Tests:  CT angiogram 12/15>>>not done LE [place] 12/11: neg LE [place] 12/17>>> neg for DVT  LE [place] - 12/20 - HAs NEW - DVT - Findings consistent with acute deep vein thrombosis involving the right posterior tibial veins, and right peroneal veins  Micro Data:  12/7 resp panel RT-PCR >> + COVID 12/7 BC x 2 >> neg 12/9 MRSA PCR  >> neg xxxx 12/20 - trach aspirate ? Sent 12/22 blood  Antimicrobials:/anticovid  12/9 actemra 12/20 actemra ? Start date - systemic steroid -> reduced solumedrol 20mg  daily 12/25/221 -. Need to seet stop date Remdesivir completed on 12/12 azithro 12/19 >>12/20 xxxx Ceftriaxone 12/19 (afebrle since 12/17) >>12/20 xxxx vanc 12/22 > 12/25 (worsening platelets d/w Dr Alvy Bimler ) Cefepime 12/22 > 12/25( worsennig platelets)  Interim history/subjective:   10/04/2020  - > now prone. Cycle #3.  On 70% fio2, peep 12 on vent -> pulse ox 95%. On Fent gt, versed gtt, nimbex gtt. Nimbex 48h time point later this afternoon. Afebrile since 12/23 esp after abx 12/22. Off pressors. Has oral cavity bleeding (new) and RUE picc site bleeding (new sponteanous)  Objective   Blood pressure (!) 117/57, pulse 79, temperature 97.8 F (36.6 C), temperature source Oral, resp. rate (!) 28, height 6\' 2"  (1.88 m), weight 113.7 kg, SpO2 98 %.    Vent Mode:  PRVC FiO2 (%):  [50 %-100 %] 70 % Set Rate:  [28 bmp] 28 bmp Vt Set:  [620 mL] 620 mL PEEP:  [10 [id number] cmH20] 12 cmH20 Plateau Pressure:  [32 cmH20-37 cmH20] 37 cmH20   Intake/Output Summary (Last 24 hours) at 10/04/2020 0856 Last data filed at 10/04/2020 0600 Gross per 24 hour  Intake 2437.63 ml  Output 2300 ml  Net 137.63 ml   Filed Weights   10/02/20 0300 10/03/20 0201 10/04/20 0500  Weight: 108.4 kg 108.4 kg 113.7 kg     General Appearance:  Looks criticall ill . PRONE. Has some dried up blood around RUE PICC and fresh blood from oral cavity Head:  Normocephalic, without obvious abnormality, atraumatic Eyes:  PERRL - not examined, conjunctiva/corneas - cannot examined     Ears:  Normal external ear canals, both ears Nose:  G tube - cortrak Throat:  ETT TUBE - yes , OG tube - no Neck:  Supple,  No enlargement/tenderness/nodules Lungs: Clear to auscultation bilaterally, Ventilator   Synchrony - yes Heart:  S1 and S2 normal,  no murmur, CVP - x.  Pressors - x Abdomen:  Soft, no masses, no organomegaly Genitalia / Rectal:  Not done,. Stool output good - no bleeding Extremities:  Extremities- intact . RUE PICC with dried blood Skin:  ntact in exposed areas . Sacral area - looks intact Neurologic:  Sedation - fent gtt, versed gtt, nimbex -> RASS - -5/BIS 46   Resolved Hospital Problem list     Assessment & Plan:  Acute hypoxic respiratory failure in the setting of Covid pneumonia and ARDS -unable to get CT chest due to instability. Sp itubation 09/29/20 and s/p TPA empiric PE 09/29/20 with confirmed DVT  -Completed remdesivir -Completed Actemra, last dose 12/10 -Has received systemic steroids - s/p TPA 09/29/20 (ssupected PE in setting of DVT and some RV strain)   10/04/2020 - > does not meet criteria for SBT/Extubation in setting of Acute Respiratory Failure due to ARDS, sedation, nimbex   Plan  -  ARDS Measure  - Target TVol 6-8cc/kgIBW  - Target Plateau  Pressure < 30cm H20  - Target driving pressure less than 15 cm of water  - Target PaO2 55-65: titrate PEEP/FiO2 per protocol  - As long as PaO2 to FiO2 ratio is less than 1:150 position in prone position for 16 hours a day  - started proning 10/01/2020, cycle #2 10/02/20, cycle #4 - 10/03/20   - continue nimbex but will aim to dc 12/25pm or 12/26AM (48-72h range)  Rule out sepsis   -Had worsening infiltrates 09/28/2020.  Empiric antibiotic treatment started.  On 09/29/2020 and 12/21 no evidence of sepsis/pneumonia . Afebrile since 12/17 but has new fever 12./22  10/04/2020 - improving. Afebrile x 24-36h. Culture still negative.    Plan - await pan culture 12/22 - vanc 12/22 >> 12/25 - cefepime 12.22 - continue for now but low threshold to dc     Circulatory shock   - +ve post intubation 09/29/20 and off pressors 09/30/20. Back on levophed as of 12/21 - 12/22 and ongoing as of 12/24.. Off pressors 12/25  Plan  -dc levophed off MAR  - fluid bolus via PIV - MAP goal > 65   Lactic acidosis  10/04/2020 - +ve and only slowly improving ? Sepsis related. CK normal. PCT ok. Seems to have improvedd after fluid bolus yesterday  Plan  - repeat 1L fluids bolus and reasses   At risk for AKI  10/04/2020 creat holding but bun up. Na high. Suggests pre-renal  plan - strict UOP/ trend renal indices/ MAP goal > 65 - increase free water    Thrombocytopenia - onset 09/26/20  RLE DVT + 12/20 (was negative 12/17)  HITT ab neg 12/20 and 12/21.    Off argatroban since 12/222/1 DIC panel +ve 10/02/20 - Smear schisto negative  10/04/2020 - now with spnatenous bleeding orally and RUE picc site.    Plan - h2 blockade instead of pppi snice 10/02/20 -dc vanc  - heme consult Dr Alvy Bimler called (prelim: likely due to illness - consumption and BM suppression) - no anticoagulation du eto low platlets  = platelet 1 unit  - cryo x 1 - Dr Alvy Bimler will order   Electrolyte  imbalance  10/04/2020 = Na 151  Plan  - continue current free water -increase dose  Transaminitis  10/04/2020  - improving  Plan  - monitor  Best practice (evaluated daily)   Diet: TF via OG since 12/20 Pain/Anxiety/Delirium protocol (if indicated): Not indicated VAP protocol (if indicated): YES since 12/20 DVT  prophylaxis:  scd  GI prophylaxis:  PPI Glucose control: stable- continue to monitor q 4 while on steroids  Mobility: Bed rest   GOALS OF CARE     Multi-Disciplinary Goals of Care Discussion Date of Discussion 09/26/2020 an 12/19  09/29/20   Primary service for patient     Location of discussion     Family and Staff present Anders Simmonds, patient and wife Dr Delton Coombes on phone with wife Cordelia Pen Dr Marchelle Gearing, April RN, wife and daughter  Summary of discussion Reversed code status to full code including short term 5-7 days aggressive ventilator care if needed nlcuing pressors but not long term support and no HD Full code. Intubate + if needed Wife says she is married him to 26  Years and will be difficult to see him on ventilator though it might be c/w goals his care -> l;ater at bedside with wife and daughter -> wife says they are devout christians and know of people esp 1 other devoute christian who was on vent for 55 days and survived. They undersand such miracle is not possible always but wife believes patient sees parallel with that . In addition, h eis a fighter and previiously well and he is afraid of leaving wife behind without a  Fight. She did says no CPR if he arrests and is not interested in prolonged mech ventilato  Followup goals of care due by   Wife invited to bedside - d/w April charge RN  Misc comments if any   No CPR. If he codes during intubation -> no CPR - d/w Wife and daughter and they affirmed it    Wife and daughter updted 12/21, 12.22, 12/23, 10/03/2020 and 10/04/20    Status: Full medical care but no CPR  Disposition: ICU    ATTESTATION &  SIGNATURE   The patient Javier Peterson is critically ill with multiple organ systems failure and requires high complexity decision making for assessment and support, frequent evaluation and titration of therapies, application of advanced monitoring technologies and extensive interpretation of multiple databases.   Critical Care Time devoted to patient care services described in this note is  60  Minutes. This time reflects time of care of this signee Dr Kalman Shan. This critical care time does not reflect procedure time, or teaching time or supervisory time of PA/NP/Med student/Med Resident etc but could involve care discussion time     Dr. Kalman Shan, M.D., [facility].C.P Pulmonary and Critical Care Medicine Staff Physician [facility] Pager: [phone], If no answer or between  15:00h - 7:00h: call [phone]  10/04/2020 9:33 AM       LABS    PULMONARY Recent Labs  Lab 10/02/20 1114 10/02/20 1836 10/02/20 2138 10/03/20 2026 10/04/20 0415 10/04/20 0840  PHART 7.412 7.388 7.398 7.457* 7.356 7.356  PCO2ART 46.0 47.9 44.7 43.7 56.2* 55.4*  PO2ART 90 121* 65.2* 40* 159* 106  HCO3 29.2* 28.8* 27.0 31.5* 31.6* 31.2*  TCO2 31 30  --  33* 33* 33*  O2SAT 97.0 99.0 92.8 83.0 99.0 98.0    CBC Recent Labs  Lab 10/03/20 0848 10/03/20 2026 10/03/20 2040 10/04/20 0415 10/04/20 0522 10/04/20 0840  HGB 12.0*   < > 11.7* 10.9* 11.4* 10.5*  HCT 38.5*   < > 36.6* 32.0* 36.3* 31.0*  WBC 9.8  --  9.1  --  9.4  --   PLT 24*  --  22*  --  23*  --    < > = values in this interval not displayed.    COAGULATION Recent Labs  Lab 09/29/20 2336 10/02/20 1139 10/03/20 0348  INR 2.0* 1.7* 1.6*    CARDIAC  No results for input(s): TROPONINI in the last 168 hours. No results for input(s): PROBNP in the last 168 hours.   CHEMISTRY Recent Labs  Lab 09/28/20 0211 09/29/20 0215 09/30/20 0428 09/30/20 0508  10/01/20 0254 10/01/20 1311 10/02/20 0352 10/02/20 1114 10/03/20 0348 10/03/20 2026 10/04/20 0415 10/04/20 0522 10/04/20 0840  NA 141   < > 147*   < > 149*   < > 149*   < > 147* 149* 150* 151* 151*  K 4.4   < > 4.6   < > 4.4   < > 4.9   < > 4.1 4.8 4.4 4.8 4.3  CL 104   < > 110  --  113*  --  112*  --  114*  --   --  112*  --   CO2 22   < > 23  --  26  --  28  --  28  --   --  28  --   GLUCOSE 103*   < > 112*  --  146*  --  114*  --  149*  --   --  89  --   BUN 45*   < > 55*  --  48*  --  38*  --  45*  --   --  43*  --   CREATININE 1.02   < > 1.19  --  1.07  --  0.84  --  0.72  --   --  0.68  --   CALCIUM 8.4*   < > 8.3*  --  8.0*  --  7.9*  --  8.0*  --   --  8.1*  --   MG 2.7*  --  2.8*  --  3.0*  --  2.8*  --   --   --   --  2.8*  --   PHOS 4.5   < > 5.3*  --  3.0  --  3.5  --  4.0  --   --  3.9  --    < > = values in this interval not displayed.   Estimated Creatinine Clearance: 110.3 mL/min (by C-G formula based on SCr of 0.68 mg/dL).   LIVER Recent Labs  Lab 09/29/20 2336 09/30/20 0428 10/01/20 0254 10/02/20 0352 10/02/20 1139 10/03/20 0348 10/04/20 0522  AST  --  57* 48* 72*  --  42* 35  ALT  --  133* 113* 161*  --  134* 107*  ALKPHOS  --  147* 102 75  --  72 61  BILITOT  --  0.8 0.9 1.0  --  0.9 0.8  PROT  --  5.9* 5.2* 5.2*  --  5.1* 5.0*  ALBUMIN  --  2.8* 2.5* 2.4*  --  2.4* 2.4*  INR 2.0*  --   --   --  1.7* 1.6*  --      INFECTIOUS Recent Labs  Lab 10/02/20 0352 10/02/20 1147 10/03/20 0348 10/03/20 0848 10/03/20 1533 10/04/20 0522  LATICACIDVEN 4.3*   < > 3.5*  --  3.0* 2.3*  PROCALCITON <0.10  --  <0.10 <0.10  --   --    < > = values in this interval not displayed.     ENDOCRINE CBG (last 3)  Recent  Labs    10/03/20 2339 10/04/20 0258 10/04/20 0746  GLUCAP 122* 109* 121*         IMAGING x48h  - image(s) personally visualized  -   highlighted in bold DG CHEST PORT 1 VIEW  Result Date: 10/04/2020 CLINICAL DATA:  COVID  pneumonia. EXAM: PORTABLE CHEST 1 VIEW COMPARISON:  12.421 FINDINGS: 0513 hours. Endotracheal tube tip is 6.2 cm above the base of the carina. The NG tube passes into the stomach although the distal tip position is not included on the film. Interval progression of confluent ground-glass airspace disease in the right mid lung. Background interstitial and hazy ground-glass lung opacity is stable. No substantial pleural effusion. Telemetry leads overlie the chest. IMPRESSION: 1. Interval progression of confluent ground-glass airspace disease in the right mid lung. 2. Stable interstitial and hazy ground-glass lung opacity bilaterally. Electronically Signed   By: Misty Stanley M.D.   On: 10/04/2020 06:46   DG CHEST PORT 1 VIEW  Result Date: 10/03/2020 CLINICAL DATA:  Endotracheal tube placement EXAM: PORTABLE CHEST 1 VIEW COMPARISON:  October 02, 2020 FINDINGS: The endotracheal tube terminates approximately 6 cm above the carina. The enteric tube extends below the left hemidiaphragm. The right-sided PICC line is stable. The heart size is stable. Diffuse bilateral airspace opacities are again noted. There is no pneumothorax. There are bilateral pleural effusions. IMPRESSION: 1. Lines and tubes as above. 2. Otherwise, no significant short interval change. Electronically Signed   By: Constance Holster M.D.   On: 10/03/2020 18:37   DG CHEST PORT 1 VIEW  Result Date: 10/02/2020 CLINICAL DATA:  Evaluate endotracheal tube tip. Pulmonary infiltrates. EXAM: PORTABLE CHEST 1 VIEW COMPARISON:  Same day chest radiograph. FINDINGS: Endotracheal tube tip at the level of the inferior clavicular heads, approximately 5.7 cm above the carina. Right PICC with the tip projecting at the inferior SVC. Gastric tube courses below the diaphragm in outside the field of view. Suspected slight worsening of extensive bilateral interstitial airspace opacities. No visible pleural effusions or pneumothorax. Cardiomediastinal silhouette is  similar to prior and within normal limits. IMPRESSION: 1. Endotracheal tube tip at the level of the inferior clavicular heads, approximately 5.7 cm above the carina. 2. Suspected slight worsening of extensive bilateral interstitial airspace opacities. Electronically Signed   By: Margaretha Sheffield MD   On: 10/02/2020 11:34   DG Chest Port 1 View  Result Date: 10/02/2020 CLINICAL DATA:  Coronavirus pneumonia.  Ventilator support. EXAM: PORTABLE CHEST 1 VIEW COMPARISON:  10/01/2020 FINDINGS: Endotracheal tube tip 10 cm above the carina. Orogastric or nasogastric tube enters the abdomen. Right arm PICC tip in the SVC above the right atrium. There is somewhat improved aeration in both lower lobes. Widespread pulmonary infiltrates persist. No worsening or new finding. IMPRESSION: 1. Endotracheal tube tip 10 cm above the carina. 2. Persistent widespread pulmonary infiltrates with somewhat improved aeration in both lower lobes. Electronically Signed   By: Nelson Chimes M.D.   On: 10/02/2020 09:32

## 2020-10-04 NOTE — Plan of Care (Signed)
  Problem: Respiratory: Goal: Will maintain a patent airway Outcome: Progressing   Problem: Nutrition: Goal: Adequate nutrition will be maintained Outcome: Progressing Note: Pt is tolerating tube feeds well.   Problem: Elimination: Goal: Will not experience complications related to bowel motility Outcome: Progressing Goal: Will not experience complications related to urinary retention Outcome: Progressing   Problem: Activity: Goal: Risk for activity intolerance will decrease Outcome: Not Progressing Note: Intubated, Sedated, Paralyzed and proned. Unable to mobilize at this time.

## 2020-10-04 NOTE — Progress Notes (Signed)
PM rounds  Now s/p platelts and cryo 50% fo2 prone -> pulse ox 89% No new bleding  Plan  - supine at 6pm  - stop nimbex at 7pm (48h+_  - check abg at 7-8pm  - check cbc, bmet, inr, lactate at 8pm   Additional 30 min ccm time  SIGNATURE    Dr. Brand Males, M.D., F.C.C.P,  Pulmonary and Critical Care Medicine Staff Physician, [facility] [facility] Director - Interstitial Lung Disease  Program  Pulmonary [place] [facility] [place], [place], [address]  Pager: [phone], If no answer  OR between  19:00-7:00h: page [phone] Telephone (clinical office): [phone] Telephone (research): [phone]  5:12 PM 10/04/2020         LABS    PULMONARY Recent Labs  Lab 10/02/20 1114 10/02/20 1836 10/02/20 2138 10/03/20 2026 10/04/20 0415 10/04/20 0840  PHART 7.412 7.388 7.398 7.457* 7.356 7.356  PCO2ART 46.0 47.9 44.7 43.7 56.2* 55.4*  PO2ART 90 121* 65.2* 40* 159* 106  HCO3 29.2* 28.8* 27.0 31.5* 31.6* 31.2*  TCO2 31 30  --  33* 33* 33*  O2SAT 97.0 99.0 92.8 83.0 99.0 98.0    CBC Recent Labs  Lab 10/03/20 2040 10/04/20 0415 10/04/20 0522 10/04/20 0840 10/04/20 0942  HGB 11.7*   < > 11.4* 10.5* 11.0*  HCT 36.6*   < > 36.3* 31.0* 35.7*  WBC 9.1  --  9.4  --  9.8  PLT 22*  --  23*  --  20*   < > = values in this interval not displayed.    COAGULATION Recent Labs  Lab 09/29/20 2336 10/02/20 1139 10/03/20 0348 10/04/20 0942  INR 2.0* 1.7* 1.6* 1.6*    CARDIAC  No results for input(s): TROPONINI in the last 168 hours. No results for input(s): PROBNP in the last 168 hours.   CHEMISTRY Recent Labs  Lab 09/30/20 0428 09/30/20 0508 10/01/20 0254 10/01/20 1311 10/02/20 0352 10/02/20 1114 10/03/20 0348 10/03/20 2026 10/04/20 0415 10/04/20 0522 10/04/20 0840 10/04/20 0942  NA 147*   < > 149*   < > 149*   < > 147* 149* 150* 151* 151*  --   K 4.6   < > 4.4   < > 4.9   < > 4.1 4.8  4.4 4.8 4.3  --   CL 110  --  113*  --  112*  --  114*  --   --  112*  --   --   CO2 23  --  26  --  28  --  28  --   --  28  --   --   GLUCOSE 112*  --  146*  --  114*  --  149*  --   --  89  --   --   BUN 55*  --  48*  --  38*  --  45*  --   --  43*  --   --   CREATININE 1.19  --  1.07  --  0.84  --  0.72  --   --  0.68  --   --   CALCIUM 8.3*  --  8.0*  --  7.9*  --  8.0*  --   --  8.1*  --   --   MG 2.8*  --  3.0*  --  2.8*  --   --   --   --  2.8*  --  2.6*  PHOS 5.3*  --  3.0  --  3.5  --  4.0  --   --  3.9  --   --    < > = values in this interval not displayed.   Estimated Creatinine Clearance: 110.3 mL/min (by C-G formula based on SCr of 0.68 mg/dL).   LIVER Recent Labs  Lab 09/29/20 2336 09/30/20 0428 10/01/20 0254 10/02/20 0352 10/02/20 1139 10/03/20 0348 10/04/20 0522 10/04/20 0942  AST  --  57* 48* 72*  --  42* 35  --   ALT  --  133* 113* 161*  --  134* 107*  --   ALKPHOS  --  147* 102 75  --  72 61  --   BILITOT  --  0.8 0.9 1.0  --  0.9 0.8  --   PROT  --  5.9* 5.2* 5.2*  --  5.1* 5.0*  --   ALBUMIN  --  2.8* 2.5* 2.4*  --  2.4* 2.4*  --   INR 2.0*  --   --   --  1.7* 1.6*  --  1.6*     INFECTIOUS Recent Labs  Lab 10/03/20 0348 10/03/20 0848 10/03/20 1533 10/04/20 0522  LATICACIDVEN 3.5*  --  3.0* 2.3*  PROCALCITON <0.10 <0.10  --  <0.10     ENDOCRINE CBG (last 3)  Recent Labs    10/04/20 0746 10/04/20 1139 10/04/20 1606  GLUCAP 121* 133* 134*         IMAGING x48h  - image(s) personally visualized  -   highlighted in bold DG CHEST PORT 1 VIEW  Result Date: 10/04/2020 CLINICAL DATA:  COVID pneumonia. EXAM: PORTABLE CHEST 1 VIEW COMPARISON:  12.421 FINDINGS: 0513 hours. Endotracheal tube tip is 6.2 cm above the base of the carina. The NG tube passes into the stomach although the distal tip position is not included on the film. Interval progression of confluent ground-glass airspace disease in the right mid lung. Background interstitial  and hazy ground-glass lung opacity is stable. No substantial pleural effusion. Telemetry leads overlie the chest. IMPRESSION: 1. Interval progression of confluent ground-glass airspace disease in the right mid lung. 2. Stable interstitial and hazy ground-glass lung opacity bilaterally. Electronically Signed   By: Misty Stanley M.D.   On: 10/04/2020 06:46   DG CHEST PORT 1 VIEW  Result Date: 10/03/2020 CLINICAL DATA:  Endotracheal tube placement EXAM: PORTABLE CHEST 1 VIEW COMPARISON:  October 02, 2020 FINDINGS: The endotracheal tube terminates approximately 6 cm above the carina. The enteric tube extends below the left hemidiaphragm. The right-sided PICC line is stable. The heart size is stable. Diffuse bilateral airspace opacities are again noted. There is no pneumothorax. There are bilateral pleural effusions. IMPRESSION: 1. Lines and tubes as above. 2. Otherwise, no significant short interval change. Electronically Signed   By: Constance Holster M.D.   On: 10/03/2020 18:37

## 2020-10-04 NOTE — Progress Notes (Signed)
Retaped patients endotracheal tube and positioned patient into prone position. Patients head facing to the right. Patient remained hemodynamically stable during procedure.

## 2020-10-04 NOTE — Consult Note (Signed)
[facility] CONSULT NOTE  Patient Care Team: Patient, No Pcp Per as PCP - General (General Practice)  ASSESSMENT & PLAN  Progressive thrombocytopenia This is multifactorial, likely due to bone marrow suppression from infection, recent broad-spectrum IV antibiotics, consumption as well as liver dysfunction Typically, I will hold off transfusion unless platelet count is less than 10 or signs of bleeding I suspect his recent bleeding is due to coagulopathy from liver dysfunction (low fibrinogen)  Recent DVT The risk of bleeding outweighs the benefit I do not recommend anticoagulation therapy right now Recommend sequential compression device if deemed appropriate by primary service  Coagulopathy 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  Plan of care is discussed with primary service  All questions were answered.   Heath Lark, MD 10/04/2020 9:28 AM  CHIEF COMPLAINTS/PURPOSE OF CONSULTATION:  Progressive thrombocytopenia with active bleeding, coagulopathy, in the setting of COVID-19 infection  HISTORY OF PRESENTING ILLNESS:  Javier Peterson 74 y.o. male is seen at the request of pulmonary and critical care physician to evaluate a patient who is currently intubated in the ICU and hospitalized over the last 17 days for COVID-19 infection  He was found to have abnormal CBC from his blood draw. His baseline CBC on September 25, 2020 Since then, he has progressive thrombocytopenia, lowest platelet count was on October 03, 2020 at 22,000 History is not possible as the patient is  currently intubated.  According to ICU staff, he is noted to be bleeding A DIC panel was drawn on October 02, 2020 which show evidence of coagulopathy with elevated prothrombin time, INR and low fibrinogen at 78 He is noted to have transaminitis from his blood draw Initially, on December 13 & December 17th, 2021, ultrasound venous Doppler on both legs are negative for DVT However, on September 29, 2020, he is now noted to have new DVT involving the right posterior tibial veins, and right peroneal veins.  D-dimer was elevated  The patient has been treated with broad-spectrum IV antibiotics with cefepime and vancomycin recently.  He is on steroids and maximum ventilatory support for COVID-19 infection  MEDICAL HISTORY:  Past Medical History:  Diagnosis Date  . Glaucoma   . OSA (obstructive sleep apnea)   . Skin cancer     SURGICAL HISTORY: Past Surgical History:  Procedure Laterality Date  . VASECTOMY      SOCIAL HISTORY: Social History   Socioeconomic History  . Marital status: Married    Spouse name: Not on file  . Number of children: Not on file  . Years of education: Not on file  . Highest education level: Not on file  Occupational History  . Not on file  Tobacco Use  . Smoking status: Never Smoker  . Smokeless tobacco: Never Used  Vaping Use  . Vaping Use: Never used  Substance and Sexual Activity  . Alcohol use: No  . Drug use: No  . Sexual activity: Not Currently  Other Topics Concern  . Not on file  Social History Narrative  . Not on file   Social Determinants of Health   Financial Resource Strain:  Not on file  Food Insecurity: Not on file  Transportation Needs: Not on file  Physical Activity: Not on file  Stress: Not on file  Social Connections: Not on file  Intimate Partner Violence: Not on file    FAMILY HISTORY: Family History  Problem Relation Age of Onset  . Diabetes Father     ALLERGIES:  has no active allergies.  MEDICATIONS:  Current  Facility-Administered Medications  Medication Dose Route Frequency Provider Last Rate Last Admin  . 0.9 %  sodium chloride infusion (Manually program via Guardrails IV Fluids)   Intravenous Once Brand Males, MD      . 0.9 %  sodium chloride infusion   Intravenous Continuous Brand Males, MD 10 mL/hr at 09/30/20 0100 Infusion Verify at 09/30/20 0100  . albuterol (VENTOLIN HFA) 108 (90 Base) MCG/ACT inhaler 2 puff  2 puff Inhalation Q4H PRN Truddie Hidden, MD   2 puff at 09/21/20 0511  . artificial tears (LACRILUBE) ophthalmic ointment 1 application  1 application Both Eyes U7O Brand Males, MD   1 application at 53/66/44 0521  . ascorbic acid (VITAMIN C) tablet 500 mg  500 mg Per Tube Daily Brand Males, MD   500 mg at 10/03/20 2245  . ceFEPIme (MAXIPIME) 2 g in sodium chloride 0.9 % 100 mL IVPB  2 g Intravenous Q8H von Caren Griffins, RPH   Stopped at 10/04/20 0347  . chlorhexidine gluconate (MEDLINE KIT) (PERIDEX) 0.12 % solution 15 mL  15 mL Mouth Rinse BID Brand Males, MD   15 mL at 10/04/20 0752  . Chlorhexidine Gluconate Cloth 2 % PADS 6 each  6 each Topical Daily Jacky Kindle, MD   6 each at 10/03/20 1823  . cisatracurium (NIMBEX) 200 mg in sodium chloride 0.9 % 100 mL (2 mg/mL) infusion  0-10 mcg/kg/min Intravenous Titrated Brand Males, MD 8.13 mL/hr at 10/04/20 0900 2.5 mcg/kg/min at 10/04/20 0900  . docusate (COLACE) 50 MG/5ML liquid 100 mg  100 mg Per Tube BID Brand Males, MD   100 mg at 10/03/20 2245  . famotidine (PEPCID) 40 MG/5ML suspension 20 mg  20 mg Per Tube QHS Brand Males, MD   20 mg at 10/03/20 2245  . feeding supplement (PIVOT 1.5 CAL) liquid 1,000 mL  1,000 mL Per Tube Continuous Brand Males, MD 70 mL/hr at 10/04/20 0600 Infusion Verify at 10/04/20 0600  . fentaNYL (SUBLIMAZE) bolus via infusion 25 mcg  25 mcg Intravenous Q15 min PRN Brand Males, MD      . fentaNYL (SUBLIMAZE) injection 25 mcg  25 mcg Intravenous  Once Brand Males, MD      . fentaNYL 2558mg in NS 2550m(1036mml) infusion-PREMIX  25-200 mcg/hr Intravenous Continuous RamBrand MalesD 20 mL/hr at 10/04/20 0900 200 mcg/hr at 10/04/20 0900  . free water 200 mL  200 mL Per Tube Q8H Ramaswamy, Murali, MD      . insulin aspart (novoLOG) injection 2-6 Units  2-6 Units Subcutaneous Q4H Ramaswamy, Murali, MD      . lactated ringers bolus 500 mL  500 mL Intravenous Once RamBrand MalesD      . latanoprost (XALATAN) 0.005 % ophthalmic solution 1 drop  1 drop Both Eyes QHS GarJennette Kettle DO   1 drop at 10/03/20 2251  . MEDLINE mouth rinse  15 mL Mouth Rinse 10 times per day RamBrand MalesD   15 mL at 10/04/20 0630  . methylPREDNISolone sodium succinate (SOLU-MEDROL) 40 mg/mL injection 40 mg  40  mg Intravenous Q24H Erick Colace, NP   40 mg at 10/04/20 0749  . midazolam (VERSED) 50 mg/50 mL (1 mg/mL) premix infusion  0-10 mg/hr Intravenous Continuous Brand Males, MD 4 mL/hr at 10/04/20 0900 4 mg/hr at 10/04/20 0900  . midazolam (VERSED) bolus via infusion 1-2 mg  1-2 mg Intravenous Q2H PRN Brand Males, MD      . polyethylene glycol (MIRALAX / GLYCOLAX) packet 17 g  17 g Per Tube Daily Brand Males, MD   17 g at 10/03/20 1055  . sodium chloride flush (NS) 0.9 % injection 10-40 mL  10-40 mL Intracatheter Q12H Brand Males, MD   10 mL at 10/03/20 2248  . sodium chloride flush (NS) 0.9 % injection 10-40 mL  10-40 mL Intracatheter PRN Brand Males, MD   10 mL at 09/29/20 2125  . zinc sulfate capsule 220 mg  220 mg Per Tube Daily Brand Males, MD   220 mg at 10/03/20 2245    REVIEW OF SYSTEMS: Unable to obtain as the patient is sedated and ventilated  PHYSICAL EXAMINATION: ECOG PERFORMANCE STATUS: 4 - Bedbound  Vitals:   10/04/20 0800 10/04/20 0827  BP:    Pulse: 87 79  Resp: (!) 28 (!) 28  Temp:    SpO2: 98% 98%   Filed Weights   10/02/20 0300 10/03/20 0201 10/04/20 0500  Weight: 238 lb  15.7 oz (108.4 kg) 238 lb 15.7 oz (108.4 kg) 250 lb 10.6 oz (113.7 kg)    GENERAL: The patient is observe through closed screen doors.  He is pronated with his head turned on to the left side, sedated and ventilated  LABORATORY DATA:  I have reviewed the data as listed Lab Results  Component Value Date   WBC 9.8 10/04/2020   HGB 11.0 (L) 10/04/2020   HCT 35.7 (L) 10/04/2020   MCV 103.8 (H) 10/04/2020   PLT 20 (LL) 10/04/2020    RADIOGRAPHIC STUDIES: I have personally reviewed the radiological images as listed and agreed with the findings in the report. DG CHEST PORT 1 VIEW  Result Date: 10/04/2020 CLINICAL DATA:  COVID pneumonia. EXAM: PORTABLE CHEST 1 VIEW COMPARISON:  12.421 FINDINGS: 0513 hours. Endotracheal tube tip is 6.2 cm above the base of the carina. The NG tube passes into the stomach although the distal tip position is not included on the film. Interval progression of confluent ground-glass airspace disease in the right mid lung. Background interstitial and hazy ground-glass lung opacity is stable. No substantial pleural effusion. Telemetry leads overlie the chest. IMPRESSION: 1. Interval progression of confluent ground-glass airspace disease in the right mid lung. 2. Stable interstitial and hazy ground-glass lung opacity bilaterally. Electronically Signed   By: Misty Stanley M.D.   On: 10/04/2020 06:46   DG CHEST PORT 1 VIEW  Result Date: 10/03/2020 CLINICAL DATA:  Endotracheal tube placement EXAM: PORTABLE CHEST 1 VIEW COMPARISON:  October 02, 2020 FINDINGS: The endotracheal tube terminates approximately 6 cm above the carina. The enteric tube extends below the left hemidiaphragm. The right-sided PICC line is stable. The heart size is stable. Diffuse bilateral airspace opacities are again noted. There is no pneumothorax. There are bilateral pleural effusions. IMPRESSION: 1. Lines and tubes as above. 2. Otherwise, no significant short interval change. Electronically Signed    By: Constance Holster M.D.   On: 10/03/2020 18:37   DG CHEST PORT 1 VIEW  Result Date: 10/02/2020 CLINICAL DATA:  Evaluate endotracheal tube tip. Pulmonary infiltrates. EXAM: PORTABLE CHEST 1 VIEW COMPARISON:  Same day chest radiograph. FINDINGS: Endotracheal tube tip at the level of the inferior clavicular heads, approximately 5.7 cm above the carina. Right PICC with the tip projecting at the inferior SVC. Gastric tube courses below the diaphragm in outside the field of view. Suspected slight worsening of extensive bilateral interstitial airspace opacities. No visible pleural effusions or pneumothorax. Cardiomediastinal silhouette is similar to prior and within normal limits. IMPRESSION: 1. Endotracheal tube tip at the level of the inferior clavicular heads, approximately 5.7 cm above the carina. 2. Suspected slight worsening of extensive bilateral interstitial airspace opacities. Electronically Signed   By: Margaretha Sheffield MD   On: 10/02/2020 11:34   DG Chest Port 1 View  Result Date: 10/02/2020 CLINICAL DATA:  Coronavirus pneumonia.  Ventilator support. EXAM: PORTABLE CHEST 1 VIEW COMPARISON:  10/01/2020 FINDINGS: Endotracheal tube tip 10 cm above the carina. Orogastric or nasogastric tube enters the abdomen. Right arm PICC tip in the SVC above the right atrium. There is somewhat improved aeration in both lower lobes. Widespread pulmonary infiltrates persist. No worsening or new finding. IMPRESSION: 1. Endotracheal tube tip 10 cm above the carina. 2. Persistent widespread pulmonary infiltrates with somewhat improved aeration in both lower lobes. Electronically Signed   By: Nelson Chimes M.D.   On: 10/02/2020 09:32   DG Chest Port 1 View  Result Date: 10/01/2020 CLINICAL DATA:  Evaluate endotracheal tube positioning. EXAM: PORTABLE CHEST 1 VIEW COMPARISON:  October 01, 2020 (5:10 a.m.) FINDINGS: There is stable endotracheal tube, nasogastric tube and right-sided PICC line positioning. Marked  severity diffuse bilateral interstitial and patchy airspace opacities are seen. This is unchanged in severity when compared to the prior exam. There is no evidence of a pleural effusion or pneumothorax. The cardiac silhouette is borderline in size. The visualized skeletal structures are unremarkable. IMPRESSION: Stable support line positioning with marked severity diffuse bilateral interstitial and patchy airspace opacities, unchanged in severity when compared to the prior exam. Electronically Signed   By: Virgina Norfolk M.D.   On: 10/01/2020 19:43   DG CHEST PORT 1 VIEW  Result Date: 10/01/2020 CLINICAL DATA:  Endotracheal tube present.  COVID positive. EXAM: PORTABLE CHEST 1 VIEW COMPARISON:  September 30, 2020. FINDINGS: Endotracheal tube tip in satisfactory position at the level of the clavicular heads. Gastric tube courses below the diaphragm with the tip projecting in expected region of the stomach. The side port appears below the gastroesophageal junction. Slightly improved aeration at the lung bases with persistent diffuse interstitial and patchy airspace opacities. No visible pleural effusions or pneumothorax. Similar cardiomediastinal silhouette. IMPRESSION: Slightly improved aeration at the lung bases with persistent diffuse interstitial and patchy airspace opacities, compatible with multifocal pneumonia. Electronically Signed   By: Margaretha Sheffield MD   On: 10/01/2020 08:05   DG CHEST PORT 1 VIEW  Result Date: 09/30/2020 CLINICAL DATA:  Evaluate ET tube placement.  COVID positive. EXAM: PORTABLE CHEST 1 VIEW COMPARISON:  09/29/2020 FINDINGS: ET tube tip is in satisfactory position above the carina. There is an NG tube with tip and side port below the GE junction. Right arm PICC line tip is in the distal SVC. Stable cardiomediastinal contours. Persistent and unchanged bilat lower lung zone predominant interstitial and airspace opacities compatible with multifocal pneumonia. IMPRESSION: 1.  Stable support apparatus. 2. Persistent bilateral lower lung zone predominant interstitial and airspace opacities compatible with multifocal pneumonia. Electronically Signed   By: Kerby Moors M.D.   On: 09/30/2020 11:44   Portable Chest x-ray  Result Date:  09/29/2020 CLINICAL DATA:  Evaluate ET tube and OG tube placement. EXAM: PORTABLE CHEST 1 VIEW COMPARISON:  09/27/2020 FINDINGS: ET tube tip is above the carina. Nasogastric tube tip is below the GE junction. Normal heart size. Diffuse bilateral interstitial and airspace densities are unchanged from previous exam. IMPRESSION: 1. Satisfactory position of ET tube and NG tube. 2. No change in aeration to the lungs compared with previous exam. Electronically Signed   By: Kerby Moors M.D.   On: 09/29/2020 15:06   DG Chest Port 1 View  Result Date: 09/27/2020 CLINICAL DATA:  Respiratory distress due to COVID-19 EXAM: PORTABLE CHEST 1 VIEW COMPARISON:  September 26, 2020 FINDINGS: Increasing opacity in the right lung, particularly the right base. Persistent opacity in left base. Stable cardiomediastinal silhouette. No pneumothorax. No other acute abnormalities. IMPRESSION: Worsening infiltrate on the right and stable infiltrate on the left, consistent with the patient's known COVID-19 pneumonia. Electronically Signed   By: Dorise Bullion III M.D   On: 09/27/2020 14:57   DG CHEST PORT 1 VIEW  Result Date: 09/26/2020 CLINICAL DATA:  74 year old male with respiratory failure EXAM: PORTABLE CHEST 1 VIEW COMPARISON:  09/23/2020, 09/21/2020 FINDINGS: Cardiomediastinal silhouette unchanged in size and contour. Reticulonodular opacities of the bilateral lungs, appearing worse from the most recent comparison of 09/23/2020, more similar to the previous of 09/21/2020. No pneumothorax or pleural effusion. IMPRESSION: Reticulonodular opacities of the lungs, suggesting multifocal infection. Electronically Signed   By: Corrie Mckusick D.O.   On: 09/26/2020 15:42    DG Chest Port 1 View  Result Date: 09/23/2020 CLINICAL DATA:  Shortness of breath.  COVID-19 virus infection. EXAM: PORTABLE CHEST 1 VIEW COMPARISON:  09/21/2020 FINDINGS: Heart size remains stable. Previously seen subtle areas of heterogeneous opacity in the lung bases is seen have improved. No new or worsening areas of pulmonary opacity are seen. No evidence of pleural effusion. IMPRESSION: Interval improvement in subtle bibasilar areas of heterogeneous opacity. Stable cardiomegaly. Electronically Signed   By: Marlaine Hind M.D.   On: 09/23/2020 09:00   DG Chest Port 1 View  Result Date: 09/21/2020 CLINICAL DATA:  Shortness of breath. EXAM: PORTABLE CHEST 1 VIEW COMPARISON:  September 19, 2020. FINDINGS: Stable cardiomegaly. No pneumothorax or pleural effusion is noted. Mild bibasilar opacities are noted concerning for infiltrates and multifocal pneumonia. Bony thorax is unremarkable. IMPRESSION: Mild bibasilar opacities are noted concerning for multifocal pneumonia. Electronically Signed   By: Marijo Conception M.D.   On: 09/21/2020 09:53   DG Chest Port 1 View  Result Date: 09/19/2020 CLINICAL DATA:  Shortness of breath.  COVID positive. EXAM: PORTABLE CHEST 1 VIEW COMPARISON:  September 16, 2020. FINDINGS: Increased conspicuity of hazy and interstitial opacities involving the left greater than right lung bases. No visible pleural effusions or pneumothorax. Biapical pleuroparenchymal scarring. Mild cardiac enlargement. IMPRESSION: Increased conspicuity of hazy and interstitial opacities involving the left greater than right lung bases, concerning for multifocal pneumonia in the setting of reported COVID positivity. Electronically Signed   By: Margaretha Sheffield MD   On: 09/19/2020 08:56   DG Chest Portable 1 View  Result Date: 09/22/2020 CLINICAL DATA:  Fever EXAM: PORTABLE CHEST 1 VIEW COMPARISON:  None. FINDINGS: There are hazy bilateral airspace opacities. The heart size is mildly enlarged.  There is no pneumothorax or large pleural effusion. No acute osseous abnormality. IMPRESSION: Hazy bilateral airspace opacities concerning for multifocal pneumonia (viral or bacterial). Electronically Signed   By: Constance Holster M.D.   On: 09/28/2020  21:13   DG Abd Portable 1V  Result Date: 09/29/2020 CLINICAL DATA:  OG tube placement EXAM: PORTABLE ABDOMEN - 1 VIEW COMPARISON:  None. FINDINGS: The OG tube tip projects over the gastric body. The tip is pointed distally. IMPRESSION: OG tube projects over the stomach. Electronically Signed   By: Constance Holster M.D.   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 Comparison Study: Previous 09/26/20 Performing Technologist: Vonzell Schlatter RVT  Examination Guidelines: A complete evaluation includes B-mode imaging, spectral Doppler, color Doppler, and power Doppler as needed of all accessible portions of each vessel. Bilateral testing is considered an integral part of a complete examination. Limited examinations for reoccurring indications may be performed as noted. The reflux portion of the exam is performed with the patient in reverse Trendelenburg.  +---------+---------------+---------+-----------+----------+--------------+ RIGHT    CompressibilityPhasicitySpontaneityPropertiesThrombus Aging +---------+---------------+---------+-----------+----------+--------------+ CFV      Full           Yes      Yes                                 +---------+---------------+---------+-----------+----------+--------------+ SFJ      Full                                                        +---------+---------------+---------+-----------+----------+--------------+ FV Prox  Full                                                        +---------+---------------+---------+-----------+----------+--------------+ FV Mid   Full                                                         +---------+---------------+---------+-----------+----------+--------------+ FV DistalFull                                                        +---------+---------------+---------+-----------+----------+--------------+ PFV      Full                                                        +---------+---------------+---------+-----------+----------+--------------+ POP      Full           Yes      Yes                                 +---------+---------------+---------+-----------+----------+--------------+ PTV      None                                                        +---------+---------------+---------+-----------+----------+--------------+  PERO     None                                                        +---------+---------------+---------+-----------+----------+--------------+   +---------+---------------+---------+-----------+----------+--------------+ LEFT     CompressibilityPhasicitySpontaneityPropertiesThrombus Aging +---------+---------------+---------+-----------+----------+--------------+ CFV      Full           Yes      Yes                                 +---------+---------------+---------+-----------+----------+--------------+ SFJ      Full                                                        +---------+---------------+---------+-----------+----------+--------------+ FV Prox  Full                                                        +---------+---------------+---------+-----------+----------+--------------+ FV Mid   Full                                                        +---------+---------------+---------+-----------+----------+--------------+ FV DistalFull                                                        +---------+---------------+---------+-----------+----------+--------------+ PFV      Full                                                         +---------+---------------+---------+-----------+----------+--------------+ POP      Full           Yes      Yes                                 +---------+---------------+---------+-----------+----------+--------------+ PTV      Full                                                        +---------+---------------+---------+-----------+----------+--------------+ PERO     Full                                                        +---------+---------------+---------+-----------+----------+--------------+  Summary: RIGHT: - Findings consistent with acute deep vein thrombosis involving the right posterior tibial veins, and right peroneal veins. - No cystic structure found in the popliteal fossa.  LEFT: - There is no evidence of deep vein thrombosis in the lower extremity.  - No cystic structure found in the popliteal fossa.  *See table(s) above for measurements and observations. Electronically signed by Deitra Mayo MD on 09/29/2020 at 4:33:30 PM.    Final    VAS [place] LOWER EXTREMITY VENOUS (DVT)  Result Date: 09/26/2020  Lower Venous DVT Study Other Indications: COVID Elevated D-Dimer. Comparison Study: Previous Exam 09/22/20 Neg DVT Performing Technologist: Vonzell Schlatter RVT  Examination Guidelines: A complete evaluation includes B-mode imaging, spectral Doppler, color Doppler, and power Doppler as needed of all accessible portions of each vessel. Bilateral testing is considered an integral part of a complete examination. Limited examinations for reoccurring indications may be performed as noted. The reflux portion of the exam is performed with the patient in reverse Trendelenburg.  +---------+---------------+---------+-----------+----------+--------------+ RIGHT    CompressibilityPhasicitySpontaneityPropertiesThrombus Aging +---------+---------------+---------+-----------+----------+--------------+ CFV      Full           Yes      Yes                                  +---------+---------------+---------+-----------+----------+--------------+ SFJ      Full                                                        +---------+---------------+---------+-----------+----------+--------------+ FV Prox  Full                                                        +---------+---------------+---------+-----------+----------+--------------+ FV Mid   Full                                                        +---------+---------------+---------+-----------+----------+--------------+ FV DistalFull                                                        +---------+---------------+---------+-----------+----------+--------------+ PFV      Full                                                        +---------+---------------+---------+-----------+----------+--------------+ POP      Full           Yes      Yes                                 +---------+---------------+---------+-----------+----------+--------------+  PTV      Full                                                        +---------+---------------+---------+-----------+----------+--------------+ PERO     Full                                                        +---------+---------------+---------+-----------+----------+--------------+   +---------+---------------+---------+-----------+----------+--------------+ LEFT     CompressibilityPhasicitySpontaneityPropertiesThrombus Aging +---------+---------------+---------+-----------+----------+--------------+ CFV      Full           Yes      Yes                                 +---------+---------------+---------+-----------+----------+--------------+ SFJ      Full                                                        +---------+---------------+---------+-----------+----------+--------------+ FV Prox  Full                                                         +---------+---------------+---------+-----------+----------+--------------+ FV Mid   Full                                                        +---------+---------------+---------+-----------+----------+--------------+ FV DistalFull                                                        +---------+---------------+---------+-----------+----------+--------------+ PFV      Full                                                        +---------+---------------+---------+-----------+----------+--------------+ POP      Full           Yes      Yes                                 +---------+---------------+---------+-----------+----------+--------------+ PTV      Full                                                        +---------+---------------+---------+-----------+----------+--------------+  PERO     Full                                                        +---------+---------------+---------+-----------+----------+--------------+     Summary: RIGHT: - There is no evidence of deep vein thrombosis in the lower extremity.  - No cystic structure found in the popliteal fossa.  LEFT: - There is no evidence of deep vein thrombosis in the lower extremity.  - No cystic structure found in the popliteal fossa.  *See table(s) above for measurements and observations. Electronically signed by Monica Martinez MD on 09/26/2020 at 40:04:56 PM.    Final    VAS [place] LOWER EXTREMITY VENOUS (DVT)  Result Date: 09/22/2020  Lower Venous DVT Study Other Indications: Covid, D-Dimer. Comparison Study: No previous exam Performing Technologist: Vonzell Schlatter RVT  Examination Guidelines: A complete evaluation includes B-mode imaging, spectral Doppler, color Doppler, and power Doppler as needed of all accessible portions of each vessel. Bilateral testing is considered an integral part of a complete examination. Limited examinations for reoccurring indications may be performed as noted. The reflux  portion of the exam is performed with the patient in reverse Trendelenburg.  +---------+---------------+---------+-----------+----------+--------------+ RIGHT    CompressibilityPhasicitySpontaneityPropertiesThrombus Aging +---------+---------------+---------+-----------+----------+--------------+ CFV      Full           Yes      Yes                                 +---------+---------------+---------+-----------+----------+--------------+ SFJ      Full                                                        +---------+---------------+---------+-----------+----------+--------------+ FV Prox  Full                                                        +---------+---------------+---------+-----------+----------+--------------+ FV Mid   Full                                                        +---------+---------------+---------+-----------+----------+--------------+ FV DistalFull                                                        +---------+---------------+---------+-----------+----------+--------------+ PFV      Full                                                        +---------+---------------+---------+-----------+----------+--------------+  POP      Full           Yes      Yes                                 +---------+---------------+---------+-----------+----------+--------------+ PTV      Full                                                        +---------+---------------+---------+-----------+----------+--------------+ PERO     Full                                                        +---------+---------------+---------+-----------+----------+--------------+   +---------+---------------+---------+-----------+----------+--------------+ LEFT     CompressibilityPhasicitySpontaneityPropertiesThrombus Aging +---------+---------------+---------+-----------+----------+--------------+ CFV      Full           Yes      Yes                                  +---------+---------------+---------+-----------+----------+--------------+ SFJ      Full                                                        +---------+---------------+---------+-----------+----------+--------------+ FV Prox  Full                                                        +---------+---------------+---------+-----------+----------+--------------+ FV Mid   Full                                                        +---------+---------------+---------+-----------+----------+--------------+ FV DistalFull                                                        +---------+---------------+---------+-----------+----------+--------------+ PFV      Full                                                        +---------+---------------+---------+-----------+----------+--------------+ POP      Full           Yes      Yes                                 +---------+---------------+---------+-----------+----------+--------------+  PTV      Full                                                        +---------+---------------+---------+-----------+----------+--------------+ PERO     Full                                                        +---------+---------------+---------+-----------+----------+--------------+     Summary: RIGHT: - There is no evidence of deep vein thrombosis in the lower extremity.  - No cystic structure found in the popliteal fossa.  LEFT: - There is no evidence of deep vein thrombosis in the lower extremity.  - No cystic structure found in the popliteal fossa.  *See table(s) above for measurements and observations. Electronically signed by Ruta Hinds MD on 09/22/2020 at 7:47:29 PM.    Final    ECHOCARDIOGRAM LIMITED  Result Date: 09/29/2020    ECHOCARDIOGRAM REPORT   Patient Name:   Javier Peterson Date of Exam: 09/29/2020 Medical Rec #:  [id number]          Height:       74.0 in Accession #:     [id number]         Weight:       263.4 lb Date of Birth:  24-Nov-1945          BSA:          2.443 m Patient Age:    2 years           BP:           90/73 mmHg Patient Gender: M                  HR:           59 bpm. Exam Location:  Inpatient Procedure: Cardiac Doppler, Color Doppler and Limited Echo Indications:    COVID-19.  History:        Patient has no prior history of Echocardiogram examinations.                 Signs/Symptoms:Shortness of Breath; Risk Factors:Sleep Apnea.                 COVID-19.  Sonographer:    Clayton Lefort RDCS (AE) Referring Phys: [id number] [facility]  Sonographer Comments: No subcostal window. Acute respiratory distress R06.03 IMPRESSIONS  1. Limited echo  2. Left ventricular ejection fraction, by estimation, is 55 to 60%. The left ventricle has normal function. The left ventricle has no regional wall motion abnormalities. There is moderate left ventricular hypertrophy. Left ventricular diastolic parameters are consistent with Grade I diastolic dysfunction (impaired relaxation).  3. Right ventricular systolic function is low normal. The right ventricular size is moderately enlarged. Tricuspid regurgitation signal is inadequate for assessing PA pressure.  4. The mitral valve is grossly normal. No evidence of mitral valve regurgitation.  5. The aortic valve is tricuspid. Aortic valve regurgitation is not visualized.  6. Aortic dilatation noted. 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 Comparison(s): No  prior Echocardiogram. FINDINGS  Left Ventricle: Left ventricular ejection fraction, by estimation, is 55 to 60%. The left ventricle has normal function. The left ventricle has no regional wall motion abnormalities. The left ventricular internal cavity size was normal in size. There is  moderate left ventricular hypertrophy. Left ventricular diastolic parameters are  consistent with Grade I diastolic dysfunction (impaired relaxation). Indeterminate filling pressures. Right Ventricle: The right ventricular size is moderately enlarged. No increase in right ventricular wall thickness. Right ventricular systolic function is low normal. Tricuspid regurgitation signal is inadequate for assessing PA pressure. Left Atrium: Left atrial size was normal in size. Right Atrium: Right atrial size was normal in size. Pericardium: There is no evidence of pericardial effusion. Mitral Valve: The mitral valve is grossly normal. No evidence of mitral valve regurgitation. Tricuspid Valve: The tricuspid valve is not well visualized. Tricuspid valve regurgitation is not demonstrated. Aortic Valve: The aortic valve is tricuspid. Aortic valve regurgitation is not visualized. Pulmonic Valve: The pulmonic valve was not well visualized. Pulmonic valve regurgitation is not visualized. Aorta: Aortic dilatation noted. 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 Venous: The inferior vena cava was not well visualized. IAS/Shunts: The interatrial septum was not assessed.  LEFT VENTRICLE PLAX 2D LVIDd:         4.70 cm  Diastology LVIDs:         3.40 cm  LV e' medial:    4.13 cm/s LV PW:         1.40 cm  LV E/e' medial:  12.9 LV IVS:        1.50 cm  LV e' lateral:   5.77 cm/s LVOT diam:     2.80 cm  LV E/e' lateral: 9.2 LVOT Area:     6.16 cm  LEFT ATRIUM         Index LA diam:    3.60 cm 1.47 cm/m   AORTA Ao Root diam: 4.30 cm Ao Asc diam:  4.20 cm MITRAL VALVE MV Area (PHT): 3.12 cm    SHUNTS MV Decel Time: 243 msec    Systemic Diam: 2.80 cm MV E velocity: 53.10 cm/s MV A velocity: 62.60 cm/s MV E/A ratio:  0.85 Lyman Bishop MD Electronically signed by Lyman Bishop MD Signature Date/Time: 09/29/2020/2:36:38 PM    Final    [place] EKG SITE RITE  Result Date: 09/29/2020 If Site Rite image not  attached, placement could not be confirmed due to current cardiac rhythm.

## 2020-10-04 NOTE — Progress Notes (Signed)
Patient placed in supine position at this time. Patient suctioned after flip. RN assist x4.

## 2020-10-04 NOTE — Progress Notes (Addendum)
eLink Physician-Brief Progress Note Patient Name: Javier Peterson DOB: [birth year] MRN: [id number]   Date of Service  10/04/2020  HPI/Events of Note  Did a camera assessment per sign out  O2 sat 89, supine, with Fio2 60/peep 12. On sedation, not on paralytics and not on pressors at this time. Vent synchrony acceptable and no distress  eICU Interventions  CXR done, pending. ABG and labs being sent now Asked RN to let us know when done.      Intervention Category Major Interventions: Respiratory failure - evaluation and management  Chamaine Stankus G Blair Mesina 10/04/2020, 8:06 PM   8:55 pm : CXR and BMP, lactate and INR noted O2 sat is 93 on 60% fio2 so will keep supine for now as he is tolerating it (even though the low Po2 might take time to recover)  Will repeat another ABG at 3 am along with AM labs  Mild elevation in lactate is likely the result of hypoxia Asked RN to let me know when 3 am labs result Will prone sooner if he has hypoxia or needs Fio2 to be increased   5:10 am Noted ABG on 70% fio2 O2 sat is 93 CBC acceptable K and creatinine ok Retain supine for now, tolerating with no issues and with good O2 sat May need to prone if things change

## 2020-10-05 ENCOUNTER — Inpatient Hospital Stay (HOSPITAL_COMMUNITY): Payer: Medicare HMO

## 2020-10-05 DIAGNOSIS — A419 Sepsis, unspecified organism: Secondary | ICD-10-CM | POA: Diagnosis present

## 2020-10-05 DIAGNOSIS — J80 Acute respiratory distress syndrome: Secondary | ICD-10-CM | POA: Diagnosis not present

## 2020-10-05 DIAGNOSIS — D689 Coagulation defect, unspecified: Secondary | ICD-10-CM | POA: Diagnosis present

## 2020-10-05 DIAGNOSIS — R579 Shock, unspecified: Secondary | ICD-10-CM | POA: Diagnosis not present

## 2020-10-05 LAB — POCT I-STAT 7, (LYTES, BLD GAS, ICA,H+H)
Acid-Base Excess: 6 mmol/L — ABNORMAL HIGH (ref 0.0–2.0)
Acid-Base Excess: 5 mmol/L — ABNORMAL HIGH (ref 0.0–2.0)
Acid-Base Excess: 4 mmol/L — ABNORMAL HIGH (ref 0.0–2.0)
Acid-base deficit: 1 mmol/L (ref 0.0–2.0)
Bicarbonate: 31.8 mmol/L — ABNORMAL HIGH (ref 20.0–28.0)
Bicarbonate: 32.1 mmol/L — ABNORMAL HIGH (ref 20.0–28.0)
Bicarbonate: 30.7 mmol/L — ABNORMAL HIGH (ref 20.0–28.0)
Bicarbonate: 32 mmol/L — ABNORMAL HIGH (ref 20.0–28.0)
Calcium, Ion: 1.27 mmol/L (ref 1.15–1.40)
Calcium, Ion: 1.24 mmol/L (ref 1.15–1.40)
Calcium, Ion: 1.23 mmol/L (ref 1.15–1.40)
Calcium, Ion: 1.21 mmol/L (ref 1.15–1.40)
HCT: 31 % — ABNORMAL LOW (ref 39.0–52.0)
HCT: 34 % — ABNORMAL LOW (ref 39.0–52.0)
HCT: 36 % — ABNORMAL LOW (ref 39.0–52.0)
HCT: 32 % — ABNORMAL LOW (ref 39.0–52.0)
Hemoglobin: 10.5 g/dL — ABNORMAL LOW (ref 13.0–17.0)
Hemoglobin: 11.6 g/dL — ABNORMAL LOW (ref 13.0–17.0)
Hemoglobin: 12.2 g/dL — ABNORMAL LOW (ref 13.0–17.0)
Hemoglobin: 10.9 g/dL — ABNORMAL LOW (ref 13.0–17.0)
O2 Saturation: 91 %
O2 Saturation: 86 %
O2 Saturation: 80 %
O2 Saturation: 93 %
Patient temperature: 99.6
Patient temperature: 99.9
Patient temperature: 99.7
Patient temperature: 98.6
Potassium: 4.6 mmol/L (ref 3.5–5.1)
Potassium: 4.9 mmol/L (ref 3.5–5.1)
Potassium: 4.9 mmol/L (ref 3.5–5.1)
Potassium: 5.3 mmol/L — ABNORMAL HIGH (ref 3.5–5.1)
Sodium: 152 mmol/L — ABNORMAL HIGH (ref 135–145)
Sodium: 154 mmol/L — ABNORMAL HIGH (ref 135–145)
Sodium: 154 mmol/L — ABNORMAL HIGH (ref 135–145)
Sodium: 153 mmol/L — ABNORMAL HIGH (ref 135–145)
TCO2: 33 mmol/L — ABNORMAL HIGH (ref 22–32)
TCO2: 34 mmol/L — ABNORMAL HIGH (ref 22–32)
TCO2: 33 mmol/L — ABNORMAL HIGH (ref 22–32)
TCO2: 34 mmol/L — ABNORMAL HIGH (ref 22–32)
pCO2 arterial: 54.9 mmHg — ABNORMAL HIGH (ref 32.0–48.0)
pCO2 arterial: 57.6 mmHg — ABNORMAL HIGH (ref 32.0–48.0)
pCO2 arterial: 93.6 mmHg (ref 32.0–48.0)
pCO2 arterial: 68.5 mmHg (ref 32.0–48.0)
pH, Arterial: 7.373 (ref 7.350–7.450)
pH, Arterial: 7.358 (ref 7.350–7.450)
pH, Arterial: 7.128 — CL (ref 7.350–7.450)
pH, Arterial: 7.278 — ABNORMAL LOW (ref 7.350–7.450)
pO2, Arterial: 67 mmHg — ABNORMAL LOW (ref 83.0–108.0)
pO2, Arterial: 57 mmHg — ABNORMAL LOW (ref 83.0–108.0)
pO2, Arterial: 64 mmHg — ABNORMAL LOW (ref 83.0–108.0)
pO2, Arterial: 77 mmHg — ABNORMAL LOW (ref 83.0–108.0)

## 2020-10-05 LAB — BASIC METABOLIC PANEL
Anion gap: 12 (ref 5–15)
BUN: 57 mg/dL — ABNORMAL HIGH (ref 8–23)
CO2: 31 mmol/L (ref 22–32)
Calcium: 8.5 mg/dL — ABNORMAL LOW (ref 8.9–10.3)
Chloride: 113 mmol/L — ABNORMAL HIGH (ref 98–111)
Creatinine, Ser: 1.26 mg/dL — ABNORMAL HIGH (ref 0.61–1.24)
GFR, Estimated: >60 mL/min (ref >60)
Glucose, Bld: 136 mg/dL — ABNORMAL HIGH (ref 70–99)
Potassium: 5.5 mmol/L — ABNORMAL HIGH (ref 3.5–5.1)
Sodium: 156 mmol/L — ABNORMAL HIGH (ref 135–145)

## 2020-10-05 LAB — CBC
HCT: 33.7 % — ABNORMAL LOW (ref 39.0–52.0)
HCT: 36.7 % — ABNORMAL LOW (ref 39.0–52.0)
Hemoglobin: 10.8 g/dL — ABNORMAL LOW (ref 13.0–17.0)
Hemoglobin: 11.1 g/dL — ABNORMAL LOW (ref 13.0–17.0)
MCH: 33.1 pg (ref 26.0–34.0)
MCH: 33 pg (ref 26.0–34.0)
MCHC: 32 g/dL (ref 30.0–36.0)
MCHC: 30.2 g/dL (ref 30.0–36.0)
MCV: 103.4 fL — ABNORMAL HIGH (ref 80.0–100.0)
MCV: 109.2 fL — ABNORMAL HIGH (ref 80.0–100.0)
Platelets: 41 10*3/uL — ABNORMAL LOW (ref 150–400)
Platelets: 20 10*3/uL — CL (ref 150–400)
RBC: 3.26 MIL/uL — ABNORMAL LOW (ref 4.22–5.81)
RBC: 3.36 MIL/uL — ABNORMAL LOW (ref 4.22–5.81)
RDW: 16.5 % — ABNORMAL HIGH (ref 11.5–15.5)
RDW: 17.2 % — ABNORMAL HIGH (ref 11.5–15.5)
WBC: 9.3 10*3/uL (ref 4.0–10.5)
WBC: 8.6 10*3/uL (ref 4.0–10.5)
nRBC: 0.6 % — ABNORMAL HIGH (ref 0.0–0.2)
nRBC: 2.2 % — ABNORMAL HIGH (ref 0.0–0.2)

## 2020-10-05 LAB — PROTIME-INR
INR: 1.5 — ABNORMAL HIGH (ref 0.8–1.2)
INR: 1.5 — ABNORMAL HIGH (ref 0.8–1.2)
Prothrombin Time: 17.3 seconds — ABNORMAL HIGH (ref 11.4–15.2)
Prothrombin Time: 17.7 s — ABNORMAL HIGH (ref 11.4–15.2)

## 2020-10-05 LAB — COMPREHENSIVE METABOLIC PANEL
ALT: 95 U/L — ABNORMAL HIGH (ref 0–44)
AST: 45 U/L — ABNORMAL HIGH (ref 15–41)
Albumin: 2.5 g/dL — ABNORMAL LOW (ref 3.5–5.0)
Alkaline Phosphatase: 73 U/L (ref 38–126)
Anion gap: 7 (ref 5–15)
BUN: 45 mg/dL — ABNORMAL HIGH (ref 8–23)
CO2: 29 mmol/L (ref 22–32)
Calcium: 8.4 mg/dL — ABNORMAL LOW (ref 8.9–10.3)
Chloride: 115 mmol/L — ABNORMAL HIGH (ref 98–111)
Creatinine, Ser: 0.8 mg/dL (ref 0.61–1.24)
GFR, Estimated: >60 mL/min (ref >60)
Glucose, Bld: 116 mg/dL — ABNORMAL HIGH (ref 70–99)
Potassium: 4.7 mmol/L (ref 3.5–5.1)
Sodium: 151 mmol/L — ABNORMAL HIGH (ref 135–145)
Total Bilirubin: 1 mg/dL (ref 0.3–1.2)
Total Protein: 5.2 g/dL — ABNORMAL LOW (ref 6.5–8.1)

## 2020-10-05 LAB — MAGNESIUM: Magnesium: 2.9 mg/dL — ABNORMAL HIGH (ref 1.7–2.4)

## 2020-10-05 LAB — BPAM CRYOPRECIPITATE
Blood Product Expiration Date: 202112252150
ISSUE DATE / TIME: 202112251810
Unit Type and Rh: 6200

## 2020-10-05 LAB — BPAM PLATELET PHERESIS
Blood Product Expiration Date: 202112272359
ISSUE DATE / TIME: 202112251631
Unit Type and Rh: 7300

## 2020-10-05 LAB — COMPREHENSIVE METABOLIC PANEL WITH GFR
ALT: 125 U/L — ABNORMAL HIGH (ref 0–44)
AST: 82 U/L — ABNORMAL HIGH (ref 15–41)
Albumin: 2.6 g/dL — ABNORMAL LOW (ref 3.5–5.0)
Alkaline Phosphatase: 101 U/L (ref 38–126)
Anion gap: 10 (ref 5–15)
BUN: 52 mg/dL — ABNORMAL HIGH (ref 8–23)
CO2: 29 mmol/L (ref 22–32)
Calcium: 8.4 mg/dL — ABNORMAL LOW (ref 8.9–10.3)
Chloride: 116 mmol/L — ABNORMAL HIGH (ref 98–111)
Creatinine, Ser: 1.1 mg/dL (ref 0.61–1.24)
GFR, Estimated: >60 mL/min
Glucose, Bld: 134 mg/dL — ABNORMAL HIGH (ref 70–99)
Potassium: 5.2 mmol/L — ABNORMAL HIGH (ref 3.5–5.1)
Sodium: 155 mmol/L — ABNORMAL HIGH (ref 135–145)
Total Bilirubin: 1.1 mg/dL (ref 0.3–1.2)
Total Protein: 5.6 g/dL — ABNORMAL LOW (ref 6.5–8.1)

## 2020-10-05 LAB — LACTIC ACID, PLASMA
Lactic Acid, Venous: 3.2 mmol/L (ref 0.5–1.9)
Lactic Acid, Venous: 6.7 mmol/L (ref 0.5–1.9)

## 2020-10-05 LAB — PREPARE PLATELET PHERESIS: Unit division: 0

## 2020-10-05 LAB — APTT: aPTT: 26 seconds (ref 24–36)

## 2020-10-05 LAB — GLUCOSE, CAPILLARY
Glucose-Capillary: 107 mg/dL — ABNORMAL HIGH (ref 70–99)
Glucose-Capillary: 107 mg/dL — ABNORMAL HIGH (ref 70–99)
Glucose-Capillary: 152 mg/dL — ABNORMAL HIGH (ref 70–99)
Glucose-Capillary: 117 mg/dL — ABNORMAL HIGH (ref 70–99)
Glucose-Capillary: 126 mg/dL — ABNORMAL HIGH (ref 70–99)
Glucose-Capillary: 118 mg/dL — ABNORMAL HIGH (ref 70–99)

## 2020-10-05 LAB — DIC (DISSEMINATED INTRAVASCULAR COAGULATION)PANEL
D-Dimer, Quant: >20 ug{FEU}/mL — ABNORMAL HIGH (ref 0.00–0.50)
Fibrinogen: 121 mg/dL — ABNORMAL LOW (ref 210–475)
INR: 1.6 — ABNORMAL HIGH (ref 0.8–1.2)
Platelets: 20 10*3/uL — CL (ref 150–400)
Prothrombin Time: 18.2 s — ABNORMAL HIGH (ref 11.4–15.2)
Smear Review: NONE SEEN
aPTT: 30 s (ref 24–36)

## 2020-10-05 LAB — PROCALCITONIN
Procalcitonin: <0.1 ng/mL
Procalcitonin: 0.99 ng/mL

## 2020-10-05 LAB — FIBRINOGEN: Fibrinogen: 101 mg/dL — ABNORMAL LOW (ref 210–475)

## 2020-10-05 LAB — CK TOTAL AND CKMB (NOT AT ARMC)
CK, MB: 6.4 ng/mL — ABNORMAL HIGH (ref 0.5–5.0)
Relative Index: 4.4 — ABNORMAL HIGH (ref 0.0–2.5)
Total CK: 144 U/L (ref 49–397)

## 2020-10-05 LAB — PHOSPHORUS
Phosphorus: 3.7 mg/dL (ref 2.5–4.6)
Phosphorus: 6.7 mg/dL — ABNORMAL HIGH (ref 2.5–4.6)

## 2020-10-05 LAB — PREPARE CRYOPRECIPITATE: Unit division: 0

## 2020-10-05 LAB — TROPONIN I (HIGH SENSITIVITY): Troponin I (High Sensitivity): 123 ng/L (ref <18)

## 2020-10-05 LAB — SAVE SMEAR(SSMR), FOR PROVIDER SLIDE REVIEW

## 2020-10-05 LAB — ANTI-DNA ANTIBODY, DOUBLE-STRANDED: ds DNA Ab: <1 IU/mL (ref 0–9)

## 2020-10-05 MED ORDER — ROCURONIUM BROMIDE 10 MG/ML (PF) SYRINGE
PREFILLED_SYRINGE | INTRAVENOUS | Status: AC
  Administered 2020-10-05: 100 mg
  Filled 2020-10-05: qty 10

## 2020-10-05 MED ORDER — NOREPINEPHRINE 4 MG/250ML-% IV SOLN
0.0000 ug/min | INTRAVENOUS | Status: DC
  Administered 2020-10-05: 2 ug/min via INTRAVENOUS
  Administered 2020-10-05: 20 ug/min via INTRAVENOUS
  Filled 2020-10-05 (×2): qty 250

## 2020-10-05 MED ORDER — VANCOMYCIN HCL IN DEXTROSE 1-5 GM/200ML-% IV SOLN
1000.0000 mg | Freq: Three times a day (TID) | INTRAVENOUS | Status: DC
  Administered 2020-10-06 (×2): 1000 mg via INTRAVENOUS
  Filled 2020-10-05 (×2): qty 200

## 2020-10-05 MED ORDER — SODIUM BICARBONATE 8.4 % IV SOLN
50.0000 meq | Freq: Once | INTRAVENOUS | Status: AC
  Administered 2020-10-05: 50 meq via INTRAVENOUS
  Filled 2020-10-05: qty 50

## 2020-10-05 MED ORDER — SODIUM CHLORIDE 0.9 % IV SOLN
2.0000 g | Freq: Three times a day (TID) | INTRAVENOUS | Status: DC
  Administered 2020-10-05 – 2020-10-09 (×11): 2 g via INTRAVENOUS
  Filled 2020-10-05 (×14): qty 2

## 2020-10-05 MED ORDER — SODIUM BICARBONATE 8.4 % IV SOLN
INTRAVENOUS | Status: DC
  Filled 2020-10-05 (×5): qty 850

## 2020-10-05 MED ORDER — ROCURONIUM BROMIDE 50 MG/5ML IV SOLN
100.0000 mg | Freq: Once | INTRAVENOUS | Status: AC
  Administered 2020-10-05: 100 mg via INTRAVENOUS

## 2020-10-05 MED ORDER — HYDROCORTISONE NA SUCCINATE PF 100 MG IJ SOLR
50.0000 mg | Freq: Four times a day (QID) | INTRAMUSCULAR | Status: DC
  Administered 2020-10-05 – 2020-10-08 (×11): 50 mg via INTRAVENOUS
  Filled 2020-10-05 (×11): qty 2

## 2020-10-05 MED ORDER — ROCURONIUM BROMIDE 10 MG/ML (PF) SYRINGE
1.0000 mg/kg | PREFILLED_SYRINGE | INTRAVENOUS | Status: DC | PRN
  Filled 2020-10-05: qty 20

## 2020-10-05 MED ORDER — FREE WATER
200.0000 mL | Freq: Four times a day (QID) | Status: DC
  Administered 2020-10-05 – 2020-10-08 (×12): 200 mL

## 2020-10-05 MED ORDER — NOREPINEPHRINE 4 MG/250ML-% IV SOLN
INTRAVENOUS | Status: AC
  Administered 2020-10-05: 4 mg
  Filled 2020-10-05: qty 250

## 2020-10-05 MED ORDER — SODIUM BICARBONATE 8.4 % IV SOLN
INTRAVENOUS | Status: AC
  Filled 2020-10-05: qty 50

## 2020-10-05 MED ORDER — SODIUM BICARBONATE 8.4 % IV SOLN: INTRAVENOUS | Status: DC

## 2020-10-05 MED ORDER — VANCOMYCIN HCL 1750 MG/350ML IV SOLN
1750.0000 mg | Freq: Once | INTRAVENOUS | Status: AC
  Administered 2020-10-05: 1750 mg via INTRAVENOUS
  Filled 2020-10-05: qty 350

## 2020-10-05 MED ORDER — LACTATED RINGERS IV BOLUS
500.0000 mL | Freq: Once | INTRAVENOUS | Status: AC
  Administered 2020-10-05: 500 mL via INTRAVENOUS

## 2020-10-05 MED ORDER — ROCURONIUM BROMIDE 50 MG/5ML IV SOLN
1.0000 mg/kg | INTRAVENOUS | Status: DC | PRN
  Filled 2020-10-05: qty 11.84

## 2020-10-05 MED ORDER — SODIUM BICARBONATE 8.4 % IV SOLN
100.0000 meq | Freq: Once | INTRAVENOUS | Status: AC
  Administered 2020-10-05: 100 meq via INTRAVENOUS
  Filled 2020-10-05: qty 50

## 2020-10-05 MED ORDER — NOREPINEPHRINE 16 MG/250ML-% IV SOLN
0.0000 ug/min | INTRAVENOUS | Status: DC
  Administered 2020-10-05: 20 ug/min via INTRAVENOUS
  Administered 2020-10-06: 4 ug/min via INTRAVENOUS
  Administered 2020-10-08: 20 ug/min via INTRAVENOUS
  Administered 2020-10-09: 2 ug/min via INTRAVENOUS
  Filled 2020-10-05 (×3): qty 250

## 2020-10-05 MED ORDER — SODIUM CHLORIDE 0.9 % IV SOLN
0.0000 ug/kg/min | INTRAVENOUS | Status: DC
  Administered 2020-10-05 – 2020-10-09 (×9): 3 ug/kg/min via INTRAVENOUS
  Filled 2020-10-05 (×12): qty 20

## 2020-10-05 MED ORDER — VASOPRESSIN 20 UNITS/100 ML INFUSION FOR SHOCK
0.0000 [IU]/min | INTRAVENOUS | Status: DC
  Administered 2020-10-05 – 2020-10-07 (×4): 0.03 [IU]/min via INTRAVENOUS
  Filled 2020-10-05 (×4): qty 100

## 2020-10-05 MED ORDER — SODIUM ZIRCONIUM CYCLOSILICATE 10 G PO PACK
10.0000 g | PACK | Freq: Two times a day (BID) | ORAL | Status: DC
  Administered 2020-10-05 – 2020-10-06 (×3): 10 g
  Filled 2020-10-05 (×3): qty 1

## 2020-10-05 NOTE — Progress Notes (Signed)
Nimbex stopped this morning and transitioned to rocuronium q4h prn. Patient continues to require prn rocuronium. Discussed with Dr. Chase Caller and requested pharmacy to resume nimbex. Patient currently on fentanyl and midazolam continuous infusions. Prn rocuronium discontinued.  Cristela Felt, PharmD Clinical Pharmacist

## 2020-10-05 NOTE — Progress Notes (Signed)
RT reported Critical ABG results to [facility] MD. No new orders given at this time.

## 2020-10-05 NOTE — Progress Notes (Signed)
RT titrated patient's PEEP to 14cm H2O from 16 due to high PIP alarming at 55cm H2O. Patient's Spo2 is at 92% at this time. RT and RN suctioned patient and turned patient's head to the right position.  PIP is now registering at [place]. RT will continue to monitor.

## 2020-10-05 NOTE — Progress Notes (Addendum)
eLink Physician-Brief Progress Note Patient Name: Javier Peterson DOB: Mar 12, 1946 MRN: [id number]   Date of Service  10/05/2020  HPI/Events of Note  DIC panel popped up. - no schistocytes. INR 1.6, stable Plt at 20 K and anemia. Fibrinogen > 100.   Discussed with RN. No bleeding issues.  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    Off argatroban since 12/222/1 DIC panel +ve 10/02/20 - Smear schisto negative Sp platelet and cryo 10/04/20 wth heme consult Dr Alvy Bimler.  10/05/2020 - resolved bleeding. Fibrinogen > 100. Plat 40s   eICU Interventions  Continue care. Watch for any bleeding or clots. Hemat to follow up in AM as needed.       Intervention Category Intermediate Interventions: Coagulopathy - evaluation and management;Diagnostic test evaluation  Javier Peterson 10/05/2020, 11:35 PM   4:08 Supposed to supine at 6.30 AM.  Ok to do supine after 7 AM, when have more staff to supine.

## 2020-10-05 NOTE — Progress Notes (Addendum)
NAME:  Javier Peterson, MRN:  [id number], DOB:  1946/09/23, LOS: 37 ADMISSION DATE:  10/02/2020, CONSULTATION DATE:  12/15 REFERRING MD:  Tana Coast, CHIEF COMPLAINT:  Respiratory failure and COVID    Brief History    74 year old nonvaccinated male admitted 12/7 with fever and dyspnea.  Also cough as well as syncopal episode at home.  Hypoxic in the emergency room requiring 8 to 9 L via nasal cannula to keep sats at 90% Covid positive. Admitted to the internal medicine service therapeutic interventions included the following treatment for COVID-19 infection and associated respiratory failure: Supplemental oxygen initially high flow, this is been persistent and also required addition of nonrebreather, active proning position, IV steroids, remdesivir, as well as 2 doses of Actemra received on 12/9 and 12/10. Over the course of his hospitalization he has remained on high flow oxygen, requiring nonrebreather mask as well as high flow oxygen Ranging from 35 L flow now up to 60 L flow, after some difficulty trying to get out of bed this morning.  Because of high oxygen demand, and tenuous respiratory status pulmonary asked to evaluate for possible transfer to the intensive care   There is no immunization history on file for this patient.   Past Medical History  Glaucoma Not vaccinated   [facility]   12/7 admitted started on remdesivir and systemic steroids  12/9: First dose of Actemra  12/10: Second dose of Actemra  12/15 remains on high flow oxygen, as well as nonrebreather.  Had episode this morning requiring titration of oxygen up and took approximately < 1 minute to recover because of this pulmonary asked to evaluate for possible transfer to ICU  12/16 looked okay initially, critical care had signed off  12/17: Rapid response called, patient reporting worsening work of breathing, oxygen titrated up to 60 L heated high flow with 100% pulse ox initially in 81 range.  Moved to  ICU placed on BiPAP.  Work of breathing improved, pulse oximetry stabilized.  Critical care assuming primary care  12/18 remains on CPAP 10, unable to tolerate BiPAP and desaturates on HHFNC and NRB, remains on low dose precedex. FUll CODE  12/19, bump in sCr, borderline hypotensive, starting IVF/ bolus and empiric abx for worsening CXR R> L infiltrates ,. Afebrile Some lower BP overnight, SBP 80's, UOP ok, slight bump in sCr 0.84-> 1.02 -642/ net +563 ml Unable to tolerate off CPAP 10/ 1.0 FiO2 for long, despite HHFNC 60L with NRB, unable to tolerate BIPAP with pressure Remains on precedex  Patient states his breathing is the same, not tired, very thirsty   12./20 aM - -d dimer better. Plateetls worse - 50s. Familhy conversation yesterday - full code. Has easy desaturations. Net neg since admit. Afebrile.  100% FiO2 on BiPAP pulse ox 82%.  Appears significantly fatigued.  12/20 PM -  PM rounds - resp muscule fatigue and intubated . He is limited code.  Hypotensive post intubation,. ECHO with mild RV strain, HAs NEW - DVT - Findings consistent with acute deep vein thrombosis involving the right posterior tibial veins, and right peroneal veins.  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 On 90% fio2, peep 14 -> pulse ox 89%,. Off levophed. On fent gtt alone. Awake and oriented but dysynchronous  12/22 - -supine, on fent gttt, precedex gtt, 80% fio2, peep 14. Not on pressors. No bleeding. On argatroban Platelet low yesterday -  still pending result today. HIT Ab - negative.  Making urine. More synchronous with vent. Has new fever 101F. Argatroban stopped  12/23 - start nimbex. Continue prone/supine ( fever appears to be responding to antibiotics.  Patient completed prone positioning at this morning was turned to supine.  He still on 90% FiO2 and a PEEP of 14.  He is on fentanyl infusion and Precedex infusion.  He is able  to nod to voice appropriately.  He did get dyssynchronous with the ventilator and with endotracheal tube cuff reinflation things improved.)  . HIT AB - negaitive from 09/29/20 and 12/21  12/24 -  lactate 3.5 and stll up. On fent gtt, versed gtt, nimbex gtt. On pressors levophed gtt. Vent -> 60%./pee[p14 On cycle #2 prone currently. Plat continues to drop - 27k. Fever has improved. Bronc yestrday - ET tube 3cm above carina. DIC panel yesterday positive / Schistocyte negative on smear  12/25 - now prone. Cycle #3.  On 70% fio2, peep 12 on vent -> pulse ox 95%. On Fent gt, versed gtt, nimbex gtt. Nimbex 48h time point later this afternoon. Afebrile since 12/23 esp after abx 12/22. Off pressors. Has oral cavity bleeding (new) and RUE picc site bleeding (new sponteanous)   Consults:  Critical care consulted 12/15  Procedures:  ETT 12/20 Aline 12/20 PICC 12/20 Bronch for et tube position 12/23  Significant Diagnostic Tests:  CT angiogram 12/15>>>not done LE [place] 12/11: neg LE [place] 12/17>>> neg for DVT  LE [place] - 12/20 - HAs NEW - DVT - Findings consistent with acute deep vein thrombosis involving the right posterior tibial veins, and right peroneal veins  Micro Data:  12/7 resp panel RT-PCR >> + COVID 12/7 BC x 2 >> neg 12/9 MRSA PCR  >> neg xxxx 12/20 - trach aspirate ? Sent 12/22 blood  Antimicrobials:/anticovid  12/9 actemra   12/20 actemra  ? Start date - systemic steroid -> reduced solumedrol 20mg  daily 12/25/221 -. Stop 12/26  Remdesivir completed on 12/12 azithro 12/19 >>12/20 xxxx Ceftriaxone 12/19 (afebrle since 12/17) >>12/20 xxxx vanc 12/22 > 12/25 (worsening platelets d/w Dr Alvy Bimler ) Cefepime 12/22 > 12/25( worsennig platelets)  Interim history/subjective:   10/05/2020  - > Day 6 ventilator . remains supine on vent. Fio2 70%,  -> PF ratio 96. Off nimbex and tolerating well per eMD at sign out.. On fent gtt, versed gtt, sp cryo and platelet yesterday -> fibrinogen  level > 100 and platelets 40s and holding.  Not on pressors. On TF.   Was hypothermic post cryoo. On bair hugger now. Cultures negaive  Persistent lactic acidosis + - 3s  Objective   Blood pressure (!) 163/75, pulse 95, temperature 99.9 F (37.7 C), temperature source Axillary, resp. rate (!) 25, height 6\' 2"  (1.88 m), weight 118.4 kg, SpO2 90 %.    Vent Mode: PRVC FiO2 (%):  [50 %-70 %] 70 % Set Rate:  [28 bmp] 28 bmp Vt Set:  [620 mL] 620 mL PEEP:  [12 cmH20] 12 cmH20 Plateau Pressure:  [27 cmH20-36 cmH20] 33 cmH20   Intake/Output Summary (Last 24 hours) at 10/05/2020 0925 Last data filed at 10/05/2020 0600 Gross per 24 hour  Intake 3390.41 ml  Output 1695 ml  Net 1695.41 ml   Filed Weights   10/03/20 0201 10/04/20 0500 10/05/20 0321  Weight: 108.4 kg 113.7 kg 118.4 kg    General Appearance:  Looks criticall ill OBESE - + Head:  Normocephalic, without obvious abnormality, atraumatic Eyes:  PERRL -  yes, conjunctiva/corneas - muddy     Ears:  Normal external ear canals, both ears Nose:  G tube - yes Throat:  ETT TUBE - yes , OG tube - no Neck:  Supple,  No enlargement/tenderness/nodules Lungs: Clear to auscultation bilaterally, Ventilator   Synchrony - NO, 70% fio2, peep 12, mild dysnchrony + despite deep sedation Heart:  S1 and S2 normal, no murmur, CVP - no.  Pressors - no Abdomen:  Soft, no masses, no organomegaly Genitalia / Rectal:  Not done Extremities:  Extremities- intact Skin:  ntact in exposed areas . Sacral area - not examined. He is sUPINe Neurologic:  Sedation - fent gtt, versed gtt -> RASS - -4/BIS 40s . Moves all 4s - no. CAM-ICU - na . Orientation - na      Resolved Hospital Problem list     Assessment & Plan:  Acute hypoxic respiratory failure in the setting of Covid pneumonia and ARDS -unable to get CT chest due to instability. Sp itubation 09/29/20 and s/p TPA empiric PE 09/29/20 with confirmed DVT  -Completed remdesivir -Completed Actemra,  last dose 12/10 -Has received systemic steroids - s/p TPA 09/29/20 (ssupected PE in setting of DVT and some RV strain) S/p 48h nimbex ending 10/04/20  10/05/2020 - > does not meet criteria for SBT/Extubation in setting of Acute Respiratory Failure due to ARDS. PF ratio still < 120  Plan  -  ARDS Measure  - Target TVol 6-8cc/kgIBW  - Target Plateau Pressure < 30cm H20  - Target driving pressure less than 15 cm of water  - Target PaO2 55-65: titrate PEEP/FiO2 per protocol  - As long as PaO2 to FiO2 ratio is less than 1:150 position in prone position for 16 hours a day  - started proning 10/01/2020, cycle #2 10/02/20, cycle #4 - 10/03/20 -> start cycle #5 v 6 12/26.21  - monitor withour paralytic - can do roc q4h prn  Rule out sepsis   -Had worsening infiltrates 09/28/2020.  Empiric antibiotic treatment started.  On 09/29/2020 and 12/21 no evidence of sepsis/pneumonia . Afebrile since 12/17 but has new fever 12./22  10/05/2020 - improving. Afebrile x 24-36h. Culture still negative. ? Hypothermic   Plan - monitor off abx    Circulatory shock   - +ve post intubation 09/29/20 and off pressors 09/30/20. Back on levophed as of 12/21 - 12/22 and ongoing as of 12/24.. Off pressors 12/25 and 12/26  Plan - MAP goal > 65   Lactic acidosis  10/05/2020 - persistently and mildy positive. Unclear why  Plan  - monitor   At risk for AKI  10/05/2020 creat holding but bun up. Na high. Suggests pre-renal  plan - strict UOP/ trend renal indices/ MAP goal > 65 - increase free water    Thrombocytopenia - onset 09/26/20  RLE DVT + 12/20 (was negative 12/17)  HITT ab neg 12/20 and 12/21.    Off argatroban since 12/222/1 DIC panel +ve 10/02/20 - Smear schisto negative Sp platelet and cryo 10/04/20 wth heme consult Dr Alvy Bimler after oral bleeding and plat 20K  10/05/2020 - resolved bleeding. Fibrinogen > 100. Plat 40s  Plan - h2 blockade instead of pppi snice 10/02/20 - no  anticoagulation du eto low platlets  - Rx with platelets if plat count <= 10k - Rx for bleeding otherwise  -> if fibrinogen < 100 give cryo and then platelets    Electrolyte imbalance  10/05/2020 = Na high  Plan  - continue current free water -increase  dose  Transaminitis  10/05/2020  - improving  Plan  - monitor  Best practice (evaluated daily)   Diet: TF via OG since 12/20 Pain/Anxiety/Delirium protocol (if indicated): Not indicated VAP protocol (if indicated): YES since 12/20 DVT prophylaxis:  scd  GI prophylaxis:  PPI Glucose control: stable- continue to monitor q 4 while on steroids  Mobility: Bed rest   GOALS OF CARE     Multi-Disciplinary Goals of Care Discussion Date of Discussion 09/26/2020 an 12/19  09/29/20   Primary service for patient     Location of discussion     Family and Staff present Marni Griffon, patient and wife Dr Lamonte Sakai on phone with wife Judeen Hammans Dr Chase Caller, April RN, wife and daughter at 64m bedside conference room  Summary of discussion Reversed code status to full code including short term 5-7 days aggressive ventilator care if needed nlcuing pressors but not long term support and no HD Full code. Intubate + if needed Wife says she is married him to 41  Years and will be difficult to see him on ventilator though it might be c/w goals his care -> l;ater at bedside with wife and daughter -> wife says they are devout christians and know of people esp 1 other devoute christian who was on vent for 55 days and survived. They undersand such miracle is not possible always but wife believes patient sees parallel with that . In addition, h eis a fighter and previiously well and he is afraid of leaving wife behind without a  Fight. She did says no CPR if he arrests and is not interested in prolonged mech ventilato  Followup goals of care due by   Wife invited to bedside - d/w April charge RN  Misc comments if any   No CPR. If he codes during intubation -> no  CPR - d/w Wife and daughter and they affirmed it    Wife and daughter updted 12/21, 12.22, 12/23, 10/03/2020, wife alone 10/04/20, and wife alone 10/05/20, - recommended MDD conference over next few days -she is available 10/08/20 (they have family commitments 10/07/20). Wife is appreciative of all support. Dr Caryl Comes is social friend and prays at his bedside daily. Wife wants to keep LTAC/trach option open due to survival after 55 days of their friend from covid    Status: Full medical care but no CPR  Disposition: ICU     ATTESTATION & SIGNATURE   The patient JACOBE SKURA is critically ill with multiple organ systems failure and requires high complexity decision making for assessment and support, frequent evaluation and titration of therapies, application of advanced monitoring technologies and extensive interpretation of multiple databases.   Critical Care Time devoted to patient care services described in this note is  40  Minutes. This time reflects time of care of this signee Dr Brand Males. This critical care time does not reflect procedure time, or teaching time or supervisory time of PA/NP/Med student/Med Resident etc but could involve care discussion time     Dr. Brand Males, M.D., [facility].C.P Pulmonary and Critical Care Medicine Staff Physician [facility] Pager: [phone], If no answer or between  15:00h - 7:00h: call [phone]  10/05/2020 10:20 AM      LABS    PULMONARY Recent Labs  Lab 10/03/20 2026 10/04/20 0415 10/04/20 0840 10/04/20 2002 10/05/20 0449  PHART 7.457* 7.356 7.356 7.335* 7.373  PCO2ART 43.7 56.2* 55.4* 56.5* 54.9*  PO2ART 40* 159* 106 46* 67*  HCO3 31.5* 31.6* 31.2* 30.6* 31.8*  TCO2 33* 33* 33* 32 33*  O2SAT 83.0 99.0 98.0 81.0 91.0    CBC Recent Labs  Lab 10/04/20 0522 10/04/20 0840 10/04/20 0942 10/04/20 2002 10/05/20 0315 10/05/20 0449  HGB 11.4*   < > 11.0*  11.2* 10.8* 10.5*  HCT 36.3*   < > 35.7* 33.0* 33.7* 31.0*  WBC 9.4  --  9.8  --  9.3  --   PLT 23*  --  20*  --  41*  --    < > = values in this interval not displayed.    COAGULATION Recent Labs  Lab 10/02/20 1139 10/03/20 0348 10/04/20 0942 10/04/20 1952 10/05/20 0315  INR 1.7* 1.6* 1.6* 1.4* 1.5*    CARDIAC  No results for input(s): TROPONINI in the last 168 hours. No results for input(s): PROBNP in the last 168 hours.   CHEMISTRY Recent Labs  Lab 09/30/20 0428 09/30/20 0508 10/01/20 0254 10/01/20 1311 10/02/20 0352 10/02/20 1114 10/03/20 0348 10/03/20 2026 10/04/20 0522 10/04/20 0840 10/04/20 0942 10/04/20 1952 10/04/20 2002 10/05/20 0315 10/05/20 0449  NA 147*   < > 149*   < > 149*   < > 147*   < > 151* 151*  --  149* 150* 151* 152*  K 4.6   < > 4.4   < > 4.9   < > 4.1   < > 4.8 4.3  --  5.3* 4.9 4.7 4.6  CL 110  --  113*  --  112*  --  114*  --  112*  --   --  113*  --  115*  --   CO2 23  --  26  --  28  --  28  --  28  --   --  30  --  29  --   GLUCOSE 112*  --  146*  --  114*  --  149*  --  89  --   --  173*  --  116*  --   BUN 55*  --  48*  --  38*  --  45*  --  43*  --   --  42*  --  45*  --   CREATININE 1.19  --  1.07  --  0.84  --  0.72  --  0.68  --   --  0.69  --  0.80  --   CALCIUM 8.3*  --  8.0*  --  7.9*  --  8.0*  --  8.1*  --   --  8.3*  --  8.4*  --   MG 2.8*  --  3.0*  --  2.8*  --   --   --  2.8*  --  2.6*  --   --   --   --   PHOS 5.3*  --  3.0  --  3.5  --  4.0  --  3.9  --   --   --   --  3.7  --    < > = values in this interval not displayed.   Estimated Creatinine Clearance: 112.5 mL/min (by C-G formula based on SCr of 0.8 mg/dL).   LIVER Recent Labs  Lab 10/02/20 0352 10/02/20 1139 10/03/20 0348 10/04/20 0522 10/04/20 0942 10/04/20 1952 10/05/20 0315  AST 72*  --  42* 35  --  34 45*  ALT 161*  --  134* 107*  --  98* 95*  ALKPHOS 75  --  72 61  --  72 73  BILITOT 1.0  --  0.9 0.8  --  0.9 1.0  PROT 5.2*  --  5.1* 5.0*   --  5.3* 5.2*  ALBUMIN 2.4*  --  2.4* 2.4*  --  2.5* 2.5*  INR  --  1.7* 1.6*  --  1.6* 1.4* 1.5*     INFECTIOUS Recent Labs  Lab 10/03/20 0848 10/03/20 1533 10/04/20 0522 10/04/20 1952 10/05/20 0315 10/05/20 0316  LATICACIDVEN  --    < > 2.3* 3.6*  --  3.2*  PROCALCITON <0.10  --  <0.10  --  <0.10  --    < > = values in this interval not displayed.     ENDOCRINE CBG (last 3)  Recent Labs    10/04/20 2334 10/05/20 0319 10/05/20 0757  GLUCAP 126* 107* 107*         IMAGING x48h  - image(s) personally visualized  -   highlighted in bold DG CHEST PORT 1 VIEW  Result Date: 10/05/2020 CLINICAL DATA:  Ventilator dependence. EXAM: PORTABLE CHEST 1 VIEW COMPARISON:  10/04/2020 FINDINGS: 0529 hours. Endotracheal tube tip is 5.5 cm above the base of the carina. Right PICC line tip overlies the distal SVC level. Diffuse interstitial and basilar airspace opacity is similar to prior. No substantial pleural effusion. Telemetry leads overlie the chest. IMPRESSION: 1. Endotracheal tube tip is 5.5 cm above the base of the carina. 2. Stable diffuse interstitial and basilar airspace disease. Electronically Signed   By: Misty Stanley M.D.   On: 10/05/2020 06:38   DG Chest Port 1 View  Result Date: 10/04/2020 CLINICAL DATA:  Intubation, COVID-19 EXAM: PORTABLE CHEST 1 VIEW COMPARISON:  Portable exam 1934 hours compared to 0513 hours FINDINGS: Tip of endotracheal tube projects 4.5 cm above carina. Nasogastric tube extends into stomach. RIGHT arm PICC line tip projects over SVC. Normal heart size, mediastinal contours, and pulmonary vascularity. Airspace infiltrates in the mid to lower lungs bilaterally consistent with multifocal pneumonia and COVID-19. No pleural effusion or pneumothorax. Osseous structures unremarkable. IMPRESSION: Persistent BILATERAL pulmonary infiltrates consistent with multifocal pneumonia and COVID-19, with decreased atelectasis in RIGHT middle lobe since previous exam.  Electronically Signed   By: Lavonia Dana M.D.   On: 10/04/2020 20:36   DG CHEST PORT 1 VIEW  Result Date: 10/04/2020 CLINICAL DATA:  COVID pneumonia. EXAM: PORTABLE CHEST 1 VIEW COMPARISON:  12.421 FINDINGS: 0513 hours. Endotracheal tube tip is 6.2 cm above the base of the carina. The NG tube passes into the stomach although the distal tip position is not included on the film. Interval progression of confluent ground-glass airspace disease in the right mid lung. Background interstitial and hazy ground-glass lung opacity is stable. No substantial pleural effusion. Telemetry leads overlie the chest. IMPRESSION: 1. Interval progression of confluent ground-glass airspace disease in the right mid lung. 2. Stable interstitial and hazy ground-glass lung opacity bilaterally. Electronically Signed   By: Misty Stanley M.D.   On: 10/04/2020 06:46   DG CHEST PORT 1 VIEW  Result Date: 10/03/2020 CLINICAL DATA:  Endotracheal tube placement EXAM: PORTABLE CHEST 1 VIEW COMPARISON:  October 02, 2020 FINDINGS: The endotracheal tube terminates approximately 6 cm above the carina. The enteric tube extends below the left hemidiaphragm. The right-sided PICC line is stable. The heart size is stable. Diffuse bilateral airspace opacities are again noted. There is no pneumothorax. There are bilateral pleural effusions. IMPRESSION: 1. Lines and tubes  as above. 2. Otherwise, no significant short interval change. Electronically Signed   By: Constance Holster M.D.   On: 10/03/2020 18:37

## 2020-10-05 NOTE — Progress Notes (Signed)
Javier Peterson   DOB:1945-10-27   [id number]    ASSESSMENT & PLAN:  Progressive thrombocytopenia This is multifactorial, likely due to bone marrow suppression from infection, recent broad-spectrum IV antibiotics, consumption as well as liver dysfunction Typically, I will hold off transfusion unless platelet count is less than 10 or signs of bleeding I suspect his recent bleeding is due to coagulopathy from liver dysfunction (low fibrinogen) Platelet count has improved since platelet transfusion yesterday Observe closely for now  Recent DVT The risk of bleeding outweighs the benefit I do not recommend anticoagulation therapy right now Recommend sequential compression device if deemed appropriate by primary service Consider resuming anticoagulation therapy when platelet count is over 50,000  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  According to staff, no further bleeding.  He is switched to supine ventilation  Objective:  Vitals:   10/05/20 0600 10/05/20 0740  BP: 139/72   Pulse: 99 (!) 108  Resp: (!) 21 (!) 30  Temp:  99.9 F (37.7 C)  SpO2: 91% 93%     Intake/Output Summary (Last 24 hours) at 10/05/2020 0834 Last data filed at 10/05/2020 0600 Gross per 24 hour  Intake 3492.08 ml  Output 1695 ml  Net 1797.08 ml    GENERAL: Sedated and ventilated.  No obvious signs of bleeding   Labs:  Recent Labs    10/04/20 0522 10/04/20 0840 10/04/20 1952  10/04/20 2002 10/05/20 0315 10/05/20 0449  NA 151*   < > 149* 150* 151* 152*  K 4.8   < > 5.3* 4.9 4.7 4.6  CL 112*  --  113*  --  115*  --   CO2 28  --  30  --  29  --   GLUCOSE 89  --  173*  --  116*  --   BUN 43*  --  42*  --  45*  --   CREATININE 0.68  --  0.69  --  0.80  --   CALCIUM 8.1*  --  8.3*  --  8.4*  --   GFRNONAA >60  --  >60  --  >60  --   PROT 5.0*  --  5.3*  --  5.2*  --   ALBUMIN 2.4*  --  2.5*  --  2.5*  --   AST 35  --  34  --  45*  --   ALT 107*  --  98*  --  95*  --   ALKPHOS 61  --  72  --  73  --   BILITOT 0.8  --  0.9  --  1.0  --    < > = values in this interval not displayed.    Studies:  DG CHEST PORT 1 VIEW  Result Date: 10/05/2020 CLINICAL DATA:  Ventilator dependence. EXAM: PORTABLE CHEST 1 VIEW COMPARISON:  10/04/2020 FINDINGS: 0529 hours. Endotracheal tube tip is 5.5 cm above the base of the carina. Right PICC line tip overlies the distal SVC level. Diffuse interstitial and basilar  airspace opacity is similar to prior. No substantial pleural effusion. Telemetry leads overlie the chest. IMPRESSION: 1. Endotracheal tube tip is 5.5 cm above the base of the carina. 2. Stable diffuse interstitial and basilar airspace disease. Electronically Signed   By: Misty Stanley M.D.   On: 10/05/2020 06:38   DG Chest Port 1 View  Result Date: 10/04/2020 CLINICAL DATA:  Intubation, COVID-19 EXAM: PORTABLE CHEST 1 VIEW COMPARISON:  Portable exam 1934 hours compared to 0513 hours FINDINGS: Tip of endotracheal tube projects 4.5 cm above carina. Nasogastric tube extends into stomach. RIGHT arm PICC line tip projects over SVC. Normal heart size, mediastinal contours, and pulmonary vascularity. Airspace infiltrates in the mid to lower lungs bilaterally consistent with multifocal pneumonia and COVID-19. No pleural effusion or pneumothorax. Osseous structures unremarkable. IMPRESSION: Persistent BILATERAL pulmonary infiltrates consistent with multifocal pneumonia and COVID-19,  with decreased atelectasis in RIGHT middle lobe since previous exam. Electronically Signed   By: Lavonia Dana M.D.   On: 10/04/2020 20:36   DG CHEST PORT 1 VIEW  Result Date: 10/04/2020 CLINICAL DATA:  COVID pneumonia. EXAM: PORTABLE CHEST 1 VIEW COMPARISON:  12.421 FINDINGS: 0513 hours. Endotracheal tube tip is 6.2 cm above the base of the carina. The NG tube passes into the stomach although the distal tip position is not included on the film. Interval progression of confluent ground-glass airspace disease in the right mid lung. Background interstitial and hazy ground-glass lung opacity is stable. No substantial pleural effusion. Telemetry leads overlie the chest. IMPRESSION: 1. Interval progression of confluent ground-glass airspace disease in the right mid lung. 2. Stable interstitial and hazy ground-glass lung opacity bilaterally. Electronically Signed   By: Misty Stanley M.D.   On: 10/04/2020 06:46   DG CHEST PORT 1 VIEW  Result Date: 10/03/2020 CLINICAL DATA:  Endotracheal tube placement EXAM: PORTABLE CHEST 1 VIEW COMPARISON:  October 02, 2020 FINDINGS: The endotracheal tube terminates approximately 6 cm above the carina. The enteric tube extends below the left hemidiaphragm. The right-sided PICC line is stable. The heart size is stable. Diffuse bilateral airspace opacities are again noted. There is no pneumothorax. There are bilateral pleural effusions. IMPRESSION: 1. Lines and tubes as above. 2. Otherwise, no significant short interval change. Electronically Signed   By: Constance Holster M.D.   On: 10/03/2020 18:37   DG CHEST PORT 1 VIEW  Result Date: 10/02/2020 CLINICAL DATA:  Evaluate endotracheal tube tip. Pulmonary infiltrates. EXAM: PORTABLE CHEST 1 VIEW COMPARISON:  Same day chest radiograph. FINDINGS: Endotracheal tube tip at the level of the inferior clavicular heads, approximately 5.7 cm above the carina. Right PICC with the tip projecting at the inferior SVC. Gastric tube courses  below the diaphragm in outside the field of view. Suspected slight worsening of extensive bilateral interstitial airspace opacities. No visible pleural effusions or pneumothorax. Cardiomediastinal silhouette is similar to prior and within normal limits. IMPRESSION: 1. Endotracheal tube tip at the level of the inferior clavicular heads, approximately 5.7 cm above the carina. 2. Suspected slight worsening of extensive bilateral interstitial airspace opacities. Electronically Signed   By: Margaretha Sheffield MD   On: 10/02/2020 11:34   DG Chest Port 1 View  Result Date: 10/02/2020 CLINICAL DATA:  Coronavirus pneumonia.  Ventilator support. EXAM: PORTABLE CHEST 1 VIEW COMPARISON:  10/01/2020 FINDINGS: Endotracheal tube tip 10 cm above the carina. Orogastric or nasogastric tube enters the abdomen. Right arm PICC tip in the SVC above the right atrium. There is somewhat improved aeration in both lower lobes.  Widespread pulmonary infiltrates persist. No worsening or new finding. IMPRESSION: 1. Endotracheal tube tip 10 cm above the carina. 2. Persistent widespread pulmonary infiltrates with somewhat improved aeration in both lower lobes. Electronically Signed   By: Paulina Fusi M.D.   On: 10/02/2020 09:32   DG Chest Port 1 View  Result Date: 10/01/2020 CLINICAL DATA:  Evaluate endotracheal tube positioning. EXAM: PORTABLE CHEST 1 VIEW COMPARISON:  October 01, 2020 (5:10 a.m.) FINDINGS: There is stable endotracheal tube, nasogastric tube and right-sided PICC line positioning. Marked severity diffuse bilateral interstitial and patchy airspace opacities are seen. This is unchanged in severity when compared to the prior exam. There is no evidence of a pleural effusion or pneumothorax. The cardiac silhouette is borderline in size. The visualized skeletal structures are unremarkable. IMPRESSION: Stable support line positioning with marked severity diffuse bilateral interstitial and patchy airspace opacities, unchanged in  severity when compared to the prior exam. Electronically Signed   By: Aram Candela M.D.   On: 10/01/2020 19:43   DG CHEST PORT 1 VIEW  Result Date: 10/01/2020 CLINICAL DATA:  Endotracheal tube present.  COVID positive. EXAM: PORTABLE CHEST 1 VIEW COMPARISON:  September 30, 2020. FINDINGS: Endotracheal tube tip in satisfactory position at the level of the clavicular heads. Gastric tube courses below the diaphragm with the tip projecting in expected region of the stomach. The side port appears below the gastroesophageal junction. Slightly improved aeration at the lung bases with persistent diffuse interstitial and patchy airspace opacities. No visible pleural effusions or pneumothorax. Similar cardiomediastinal silhouette. IMPRESSION: Slightly improved aeration at the lung bases with persistent diffuse interstitial and patchy airspace opacities, compatible with multifocal pneumonia. Electronically Signed   By: Feliberto Harts MD   On: 10/01/2020 08:05   DG CHEST PORT 1 VIEW  Result Date: 09/30/2020 CLINICAL DATA:  Evaluate ET tube placement.  COVID positive. EXAM: PORTABLE CHEST 1 VIEW COMPARISON:  09/29/2020 FINDINGS: ET tube tip is in satisfactory position above the carina. There is an NG tube with tip and side port below the GE junction. Right arm PICC line tip is in the distal SVC. Stable cardiomediastinal contours. Persistent and unchanged bilat lower lung zone predominant interstitial and airspace opacities compatible with multifocal pneumonia. IMPRESSION: 1. Stable support apparatus. 2. Persistent bilateral lower lung zone predominant interstitial and airspace opacities compatible with multifocal pneumonia. Electronically Signed   By: Signa Kell M.D.   On: 09/30/2020 11:44   Portable Chest x-ray  Result Date: 09/29/2020 CLINICAL DATA:  Evaluate ET tube and OG tube placement. EXAM: PORTABLE CHEST 1 VIEW COMPARISON:  09/27/2020 FINDINGS: ET tube tip is above the carina. Nasogastric tube  tip is below the GE junction. Normal heart size. Diffuse bilateral interstitial and airspace densities are unchanged from previous exam. IMPRESSION: 1. Satisfactory position of ET tube and NG tube. 2. No change in aeration to the lungs compared with previous exam. Electronically Signed   By: Signa Kell M.D.   On: 09/29/2020 15:06   DG Chest Port 1 View  Result Date: 09/27/2020 CLINICAL DATA:  Respiratory distress due to COVID-19 EXAM: PORTABLE CHEST 1 VIEW COMPARISON:  September 26, 2020 FINDINGS: Increasing opacity in the right lung, particularly the right base. Persistent opacity in left base. Stable cardiomediastinal silhouette. No pneumothorax. No other acute abnormalities. IMPRESSION: Worsening infiltrate on the right and stable infiltrate on the left, consistent with the patient's known COVID-19 pneumonia. Electronically Signed   By: Gerome Sam III M.D   On: 09/27/2020 14:57  DG CHEST PORT 1 VIEW  Result Date: 09/26/2020 CLINICAL DATA:  74 year old male with respiratory failure EXAM: PORTABLE CHEST 1 VIEW COMPARISON:  09/23/2020, 09/21/2020 FINDINGS: Cardiomediastinal silhouette unchanged in size and contour. Reticulonodular opacities of the bilateral lungs, appearing worse from the most recent comparison of 09/23/2020, more similar to the previous of 09/21/2020. No pneumothorax or pleural effusion. IMPRESSION: Reticulonodular opacities of the lungs, suggesting multifocal infection. Electronically Signed   By: Corrie Mckusick D.O.   On: 09/26/2020 15:42   DG Chest Port 1 View  Result Date: 09/23/2020 CLINICAL DATA:  Shortness of breath.  COVID-19 virus infection. EXAM: PORTABLE CHEST 1 VIEW COMPARISON:  09/21/2020 FINDINGS: Heart size remains stable. Previously seen subtle areas of heterogeneous opacity in the lung bases is seen have improved. No new or worsening areas of pulmonary opacity are seen. No evidence of pleural effusion. IMPRESSION: Interval improvement in subtle bibasilar  areas of heterogeneous opacity. Stable cardiomegaly. Electronically Signed   By: Marlaine Hind M.D.   On: 09/23/2020 09:00   DG Chest Port 1 View  Result Date: 09/21/2020 CLINICAL DATA:  Shortness of breath. EXAM: PORTABLE CHEST 1 VIEW COMPARISON:  September 19, 2020. FINDINGS: Stable cardiomegaly. No pneumothorax or pleural effusion is noted. Mild bibasilar opacities are noted concerning for infiltrates and multifocal pneumonia. Bony thorax is unremarkable. IMPRESSION: Mild bibasilar opacities are noted concerning for multifocal pneumonia. Electronically Signed   By: Marijo Conception M.D.   On: 09/21/2020 09:53   DG Chest Port 1 View  Result Date: 09/19/2020 CLINICAL DATA:  Shortness of breath.  COVID positive. EXAM: PORTABLE CHEST 1 VIEW COMPARISON:  September 16, 2020. FINDINGS: Increased conspicuity of hazy and interstitial opacities involving the left greater than right lung bases. No visible pleural effusions or pneumothorax. Biapical pleuroparenchymal scarring. Mild cardiac enlargement. IMPRESSION: Increased conspicuity of hazy and interstitial opacities involving the left greater than right lung bases, concerning for multifocal pneumonia in the setting of reported COVID positivity. Electronically Signed   By: Margaretha Sheffield MD   On: 09/19/2020 08:56   DG Chest Portable 1 View  Result Date: 10/06/2020 CLINICAL DATA:  Fever EXAM: PORTABLE CHEST 1 VIEW COMPARISON:  None. FINDINGS: There are hazy bilateral airspace opacities. The heart size is mildly enlarged. There is no pneumothorax or large pleural effusion. No acute osseous abnormality. IMPRESSION: Hazy bilateral airspace opacities concerning for multifocal pneumonia (viral or bacterial). Electronically Signed   By: Constance Holster M.D.   On: 09/26/2020 21:13   DG Abd Portable 1V  Result Date: 09/29/2020 CLINICAL DATA:  OG tube placement EXAM: PORTABLE ABDOMEN - 1 VIEW COMPARISON:  None. FINDINGS: The OG tube tip projects over the gastric  body. The tip is pointed distally. IMPRESSION: OG tube projects over the stomach. Electronically Signed   By: Constance Holster M.D.   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 Comparison Study: Previous 09/26/20 Performing Technologist: Vonzell Schlatter RVT  Examination Guidelines: A complete evaluation includes B-mode imaging, spectral Doppler, color Doppler, and power Doppler as needed of all accessible portions of each vessel. Bilateral testing is considered an integral part of a complete examination. Limited examinations for reoccurring indications may be performed as noted. The reflux portion of the exam is performed with the patient in reverse Trendelenburg.  +---------+---------------+---------+-----------+----------+--------------+ RIGHT    CompressibilityPhasicitySpontaneityPropertiesThrombus Aging +---------+---------------+---------+-----------+----------+--------------+ CFV      Full  Yes      Yes                                 +---------+---------------+---------+-----------+----------+--------------+ SFJ      Full                                                        +---------+---------------+---------+-----------+----------+--------------+ FV Prox  Full                                                        +---------+---------------+---------+-----------+----------+--------------+ FV Mid   Full                                                        +---------+---------------+---------+-----------+----------+--------------+ FV DistalFull                                                        +---------+---------------+---------+-----------+----------+--------------+ PFV      Full                                                        +---------+---------------+---------+-----------+----------+--------------+ POP      Full           Yes      Yes                                  +---------+---------------+---------+-----------+----------+--------------+ PTV      None                                                        +---------+---------------+---------+-----------+----------+--------------+ PERO     None                                                        +---------+---------------+---------+-----------+----------+--------------+   +---------+---------------+---------+-----------+----------+--------------+ LEFT     CompressibilityPhasicitySpontaneityPropertiesThrombus Aging +---------+---------------+---------+-----------+----------+--------------+ CFV      Full           Yes      Yes                                 +---------+---------------+---------+-----------+----------+--------------+ SFJ  Full                                                        +---------+---------------+---------+-----------+----------+--------------+ FV Prox  Full                                                        +---------+---------------+---------+-----------+----------+--------------+ FV Mid   Full                                                        +---------+---------------+---------+-----------+----------+--------------+ FV DistalFull                                                        +---------+---------------+---------+-----------+----------+--------------+ PFV      Full                                                        +---------+---------------+---------+-----------+----------+--------------+ POP      Full           Yes      Yes                                 +---------+---------------+---------+-----------+----------+--------------+ PTV      Full                                                        +---------+---------------+---------+-----------+----------+--------------+ PERO     Full                                                         +---------+---------------+---------+-----------+----------+--------------+     Summary: RIGHT: - Findings consistent with acute deep vein thrombosis involving the right posterior tibial veins, and right peroneal veins. - No cystic structure found in the popliteal fossa.  LEFT: - There is no evidence of deep vein thrombosis in the lower extremity.  - No cystic structure found in the popliteal fossa.  *See table(s) above for measurements and observations. Electronically signed by Deitra Mayo MD on 09/29/2020 at 4:33:30 PM.    Final    VAS [place] LOWER EXTREMITY VENOUS (DVT)  Result Date: 09/26/2020  Lower Venous DVT Study Other Indications: COVID Elevated D-Dimer. Comparison Study: Previous Exam 09/22/20 Neg DVT Performing Technologist: Vonzell Schlatter RVT  Examination  Guidelines: A complete evaluation includes B-mode imaging, spectral Doppler, color Doppler, and power Doppler as needed of all accessible portions of each vessel. Bilateral testing is considered an integral part of a complete examination. Limited examinations for reoccurring indications may be performed as noted. The reflux portion of the exam is performed with the patient in reverse Trendelenburg.  +---------+---------------+---------+-----------+----------+--------------+ RIGHT    CompressibilityPhasicitySpontaneityPropertiesThrombus Aging +---------+---------------+---------+-----------+----------+--------------+ CFV      Full           Yes      Yes                                 +---------+---------------+---------+-----------+----------+--------------+ SFJ      Full                                                        +---------+---------------+---------+-----------+----------+--------------+ FV Prox  Full                                                        +---------+---------------+---------+-----------+----------+--------------+ FV Mid   Full                                                         +---------+---------------+---------+-----------+----------+--------------+ FV DistalFull                                                        +---------+---------------+---------+-----------+----------+--------------+ PFV      Full                                                        +---------+---------------+---------+-----------+----------+--------------+ POP      Full           Yes      Yes                                 +---------+---------------+---------+-----------+----------+--------------+ PTV      Full                                                        +---------+---------------+---------+-----------+----------+--------------+ PERO     Full                                                        +---------+---------------+---------+-----------+----------+--------------+   +---------+---------------+---------+-----------+----------+--------------+  LEFT     CompressibilityPhasicitySpontaneityPropertiesThrombus Aging +---------+---------------+---------+-----------+----------+--------------+ CFV      Full           Yes      Yes                                 +---------+---------------+---------+-----------+----------+--------------+ SFJ      Full                                                        +---------+---------------+---------+-----------+----------+--------------+ FV Prox  Full                                                        +---------+---------------+---------+-----------+----------+--------------+ FV Mid   Full                                                        +---------+---------------+---------+-----------+----------+--------------+ FV DistalFull                                                        +---------+---------------+---------+-----------+----------+--------------+ PFV      Full                                                         +---------+---------------+---------+-----------+----------+--------------+ POP      Full           Yes      Yes                                 +---------+---------------+---------+-----------+----------+--------------+ PTV      Full                                                        +---------+---------------+---------+-----------+----------+--------------+ PERO     Full                                                        +---------+---------------+---------+-----------+----------+--------------+     Summary: RIGHT: - There is no evidence of deep vein thrombosis in the lower extremity.  - No cystic structure found in the popliteal fossa.  LEFT: - There is no evidence of deep vein thrombosis in the lower extremity.  - No  cystic structure found in the popliteal fossa.  *See table(s) above for measurements and observations. Electronically signed by Monica Martinez MD on 09/26/2020 at 42:04:56 PM.    Final    VAS [place] LOWER EXTREMITY VENOUS (DVT)  Result Date: 09/22/2020  Lower Venous DVT Study Other Indications: Covid, D-Dimer. Comparison Study: No previous exam Performing Technologist: Vonzell Schlatter RVT  Examination Guidelines: A complete evaluation includes B-mode imaging, spectral Doppler, color Doppler, and power Doppler as needed of all accessible portions of each vessel. Bilateral testing is considered an integral part of a complete examination. Limited examinations for reoccurring indications may be performed as noted. The reflux portion of the exam is performed with the patient in reverse Trendelenburg.  +---------+---------------+---------+-----------+----------+--------------+ RIGHT    CompressibilityPhasicitySpontaneityPropertiesThrombus Aging +---------+---------------+---------+-----------+----------+--------------+ CFV      Full           Yes      Yes                                 +---------+---------------+---------+-----------+----------+--------------+  SFJ      Full                                                        +---------+---------------+---------+-----------+----------+--------------+ FV Prox  Full                                                        +---------+---------------+---------+-----------+----------+--------------+ FV Mid   Full                                                        +---------+---------------+---------+-----------+----------+--------------+ FV DistalFull                                                        +---------+---------------+---------+-----------+----------+--------------+ PFV      Full                                                        +---------+---------------+---------+-----------+----------+--------------+ POP      Full           Yes      Yes                                 +---------+---------------+---------+-----------+----------+--------------+ PTV      Full                                                        +---------+---------------+---------+-----------+----------+--------------+  PERO     Full                                                        +---------+---------------+---------+-----------+----------+--------------+   +---------+---------------+---------+-----------+----------+--------------+ LEFT     CompressibilityPhasicitySpontaneityPropertiesThrombus Aging +---------+---------------+---------+-----------+----------+--------------+ CFV      Full           Yes      Yes                                 +---------+---------------+---------+-----------+----------+--------------+ SFJ      Full                                                        +---------+---------------+---------+-----------+----------+--------------+ FV Prox  Full                                                        +---------+---------------+---------+-----------+----------+--------------+ FV Mid   Full                                                         +---------+---------------+---------+-----------+----------+--------------+ FV DistalFull                                                        +---------+---------------+---------+-----------+----------+--------------+ PFV      Full                                                        +---------+---------------+---------+-----------+----------+--------------+ POP      Full           Yes      Yes                                 +---------+---------------+---------+-----------+----------+--------------+ PTV      Full                                                        +---------+---------------+---------+-----------+----------+--------------+ PERO     Full                                                        +---------+---------------+---------+-----------+----------+--------------+  Summary: RIGHT: - There is no evidence of deep vein thrombosis in the lower extremity.  - No cystic structure found in the popliteal fossa.  LEFT: - There is no evidence of deep vein thrombosis in the lower extremity.  - No cystic structure found in the popliteal fossa.  *See table(s) above for measurements and observations. Electronically signed by Ruta Hinds MD on 09/22/2020 at 7:47:29 PM.    Final    ECHOCARDIOGRAM LIMITED  Result Date: 09/29/2020    ECHOCARDIOGRAM REPORT   Patient Name:   Javier Peterson Date of Exam: 09/29/2020 Medical Rec #:  [id number]          Height:       74.0 in Accession #:    [id number]         Weight:       263.4 lb Date of Birth:  27-Apr-1946          BSA:          2.443 m Patient Age:    44 years           BP:           90/73 mmHg Patient Gender: M                  HR:           59 bpm. Exam Location:  Inpatient Procedure: Cardiac Doppler, Color Doppler and Limited Echo Indications:    COVID-19.  History:        Patient has no prior history of Echocardiogram examinations.                 Signs/Symptoms:Shortness of Breath;  Risk Factors:Sleep Apnea.                 COVID-19.  Sonographer:    Clayton Lefort RDCS (AE) Referring Phys: [id number]  Sonographer Comments: No subcostal window. Acute respiratory distress R06.03 IMPRESSIONS  1. Limited echo  2. Left ventricular ejection fraction, by estimation, is 55 to 60%. The left ventricle has normal function. The left ventricle has no regional wall motion abnormalities. There is moderate left ventricular hypertrophy. Left ventricular diastolic parameters are consistent with Grade I diastolic dysfunction (impaired relaxation).  3. Right ventricular systolic function is low normal. The right ventricular size is moderately enlarged. Tricuspid regurgitation signal is inadequate for assessing PA pressure.  4. The mitral valve is grossly normal. No evidence of mitral valve regurgitation.  5. The aortic valve is tricuspid. Aortic valve regurgitation is not visualized.  6. Aortic dilatation noted. 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 Comparison(s): No prior Echocardiogram. FINDINGS  Left Ventricle: Left ventricular ejection fraction, by estimation, is 55 to 60%. The left ventricle has normal function. The left ventricle has no regional wall motion abnormalities. The left ventricular internal cavity size was normal in size. There is  moderate left ventricular hypertrophy. Left ventricular diastolic parameters are consistent with Grade I diastolic dysfunction (impaired relaxation). Indeterminate filling pressures. Right Ventricle: The right ventricular size is moderately enlarged. No increase in right ventricular wall thickness. Right ventricular systolic function is low normal. Tricuspid regurgitation signal is inadequate for assessing PA pressure. Left Atrium: Left atrial size was normal in size. Right Atrium: Right atrial size was normal in size. Pericardium: There is  no evidence of pericardial effusion. Mitral Valve: The mitral valve is  grossly normal. No evidence of mitral valve regurgitation. Tricuspid Valve: The tricuspid valve is not well visualized. Tricuspid valve regurgitation is not demonstrated. Aortic Valve: The aortic valve is tricuspid. Aortic valve regurgitation is not visualized. Pulmonic Valve: The pulmonic valve was not well visualized. Pulmonic valve regurgitation is not visualized. Aorta: Aortic dilatation noted. 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 Venous: The inferior vena cava was not well visualized. IAS/Shunts: The interatrial septum was not assessed.  LEFT VENTRICLE PLAX 2D LVIDd:         4.70 cm  Diastology LVIDs:         3.40 cm  LV e' medial:    4.13 cm/s LV PW:         1.40 cm  LV E/e' medial:  12.9 LV IVS:        1.50 cm  LV e' lateral:   5.77 cm/s LVOT diam:     2.80 cm  LV E/e' lateral: 9.2 LVOT Area:     6.16 cm  LEFT ATRIUM         Index LA diam:    3.60 cm 1.47 cm/m   AORTA Ao Root diam: 4.30 cm Ao Asc diam:  4.20 cm MITRAL VALVE MV Area (PHT): 3.12 cm    SHUNTS MV Decel Time: 243 msec    Systemic Diam: 2.80 cm MV E velocity: 53.10 cm/s MV A velocity: 62.60 cm/s MV E/A ratio:  0.85 Lyman Bishop MD Electronically signed by Lyman Bishop MD Signature Date/Time: 09/29/2020/2:36:38 PM    Final    [place] EKG SITE RITE  Result Date: 09/29/2020 If Site Rite image not attached, placement could not be confirmed due to current cardiac rhythm.

## 2020-10-05 NOTE — Progress Notes (Addendum)
Pharmacy Antibiotic Note  ADONTE VANRIPER is a 74 y.o. male admitted on 09/29/2020 with sepsis.  Pharmacy has been consulted for Meropenem dosing. Patient is now s/p 4 days of Cefepime and vancomycin. Abx were stopped but now with increasing lactate.   SCr wnl. PCT 0.99,    Plan: -Start meropenem 2 gm IV Q 8 hours  -Monitor CBC, renal fx, cultures and clinical progress   Height: 6\' 2"  (188 cm) Weight: 118.4 kg (261 lb 0.4 oz) IBW/kg (Calculated) : 82.2  Temp (24hrs), Avg:98.5 F (36.9 C), Min:96.2 F (35.7 C), Max:100 F (37.8 C)  Recent Labs  Lab 10/03/20 0348 10/03/20 0848 10/03/20 1532 10/03/20 1533 10/03/20 2040 10/04/20 0522 10/04/20 0942 10/04/20 1952 10/05/20 0315 10/05/20 0316 10/05/20 1644 10/05/20 1646  WBC 10.6* 9.8  --   --  9.1 9.4 9.8  --  9.3  --   --   --   CREATININE 0.72  --   --   --   --  0.68  --  0.69 0.80  --  1.10  --   LATICACIDVEN 3.5*  --   --  3.0*  --  2.3*  --  3.6*  --  3.2*  --  6.7*  VANCOTROUGH  --   --  6*  --   --   --   --   --   --   --   --   --     Estimated Creatinine Clearance: 81.8 mL/min (by C-G formula based on SCr of 1.1 mg/dL).    No Active Allergies  CTX 12/19 >> 12/21 Azith 12/19 >> 12/20 Cefepime 12/22>>12/26 Vanc 12/22>> 12/25 Meropenem 12/26 >>   12/8 remdesivir >> 12/12 12/9 Actemra x 1; 12/10 x 1 12/7 bcx -neg 12/9 mrsa pcr neg 12/22 bcx: ngtd  Thank you for allowing pharmacy to be a part of this patient's care.  Albertina Parr, PharmD., BCPS, BCCCP Clinical Pharmacist Please refer to [facility] for unit-specific pharmacist   Addendum: Now adding vancomycin for empiric coverage. Will reload with vancomycin 1750 mg IV once followed by vancomycin 1 gm IV Q 8 hours. Follow renal fx closely as SCr is trending up.   Albertina Parr, PharmD., BCPS, BCCCP Clinical Pharmacist Please refer to [facility] for unit-specific pharmacist

## 2020-10-05 NOTE — Progress Notes (Signed)
eLink Physician-Brief Progress Note Patient Name: Javier Peterson DOB: November 23, 1945 MRN: [id number]   Date of Service  10/05/2020  HPI/Events of Note  ABG: Improving P/F ratio and acidosis resolving.  Camera eval done earlier.  eICU Interventions  Continue lung protective ventilation.  Discussed with RT     Intervention Category Intermediate Interventions: Diagnostic test evaluation  Elmer Sow 10/05/2020, 10:07 PM

## 2020-10-05 NOTE — Progress Notes (Signed)
EVENING ROUND   S: Date of admit 09/20/2020 with LOS 17 for today 10/05/2020 : Javier Peterson is  -multiple calls and interventions throughout the course of the day particularly in the afternoon.  Patient is now prone but prior to that patient had significant ventilator dyssynchrony and worsening hypoxemia.  He also has developed circulatory shock requiring fluids and pressors.  ABG shows severe respiratory acidosis.  Lactic acidosis is worse.  His platelets are still pending.  He is back on Nimbex along with deep sedation.  Labs also show hyperkalemia.  Creatinine is tending to go up  Procalcitonin is gone up to almost 1 [previously it was less than 0.1]  O:  Blood pressure (!) 163/75, pulse 95, temperature 99.7 F (37.6 C), temperature source Axillary, resp. rate (!) 25, height 6\' 2"  (1.88 m), weight 118.4 kg, SpO2 (!) 87 %.   Currently prone 100% oxygen PEEP of 14 on deep sedation and Nimbex   LABS    PULMONARY Recent Labs  Lab 10/04/20 0840 10/04/20 2002 10/05/20 0449 10/05/20 1104 10/05/20 1617  PHART 7.356 7.335* 7.373 7.358 7.128*  PCO2ART 55.4* 56.5* 54.9* 57.6* 93.6*  PO2ART 106 46* 67* 57* 64*  HCO3 31.2* 30.6* 31.8* 32.1* 30.7*  TCO2 33* 32 33* 34* 33*  O2SAT 98.0 81.0 91.0 86.0 80.0    CBC Recent Labs  Lab 10/04/20 0522 10/04/20 0840 10/04/20 0942 10/04/20 2002 10/05/20 0315 10/05/20 0449 10/05/20 1104 10/05/20 1617  HGB 11.4*   < > 11.0*   < > 10.8* 10.5* 11.6* 12.2*  HCT 36.3*   < > 35.7*   < > 33.7* 31.0* 34.0* 36.0*  WBC 9.4  --  9.8  --  9.3  --   --   --   PLT 23*  --  20*  --  41*  --   --   --    < > = values in this interval not displayed.    COAGULATION Recent Labs  Lab 10/03/20 0348 10/04/20 0942 10/04/20 1952 10/05/20 0315 10/05/20 1644  INR 1.6* 1.6* 1.4* 1.5* 1.5*    CARDIAC  No results for input(s): TROPONINI in the last 168 hours. No results for input(s): PROBNP in the last 168 hours.   CHEMISTRY Recent Labs  Lab  10/01/20 0254 10/01/20 1311 10/02/20 0352 10/02/20 1114 10/03/20 0348 10/03/20 2026 10/04/20 0522 10/04/20 0840 10/04/20 0942 10/04/20 1952 10/04/20 2002 10/05/20 0315 10/05/20 0449 10/05/20 1104 10/05/20 1617 10/05/20 1644  NA 149*   < > 149*   < > 147*   < > 151*   < >  --  149*   < > 151* 152* 154* 154* 155*  K 4.4   < > 4.9   < > 4.1   < > 4.8   < >  --  5.3*   < > 4.7 4.6 4.9 4.9 5.2*  CL 113*  --  112*  --  114*  --  112*  --   --  113*  --  115*  --   --   --  116*  CO2 26  --  28  --  28  --  28  --   --  30  --  29  --   --   --  29  GLUCOSE 146*  --  114*  --  149*  --  89  --   --  173*  --  116*  --   --   --  134*  BUN 48*  --  38*  --  45*  --  43*  --   --  42*  --  45*  --   --   --  52*  CREATININE 1.07  --  0.84  --  0.72  --  0.68  --   --  0.69  --  0.80  --   --   --  1.10  CALCIUM 8.0*  --  7.9*  --  8.0*  --  8.1*  --   --  8.3*  --  8.4*  --   --   --  8.4*  MG 3.0*  --  2.8*  --   --   --  2.8*  --  2.6*  --   --   --   --   --   --  2.9*  PHOS 3.0  --  3.5  --  4.0  --  3.9  --   --   --   --  3.7  --   --   --  6.7*   < > = values in this interval not displayed.   Estimated Creatinine Clearance: 81.8 mL/min (by C-G formula based on SCr of 1.1 mg/dL).   LIVER Recent Labs  Lab 10/03/20 0348 10/04/20 0522 10/04/20 0942 10/04/20 1952 10/05/20 0315 10/05/20 1644  AST 42* 35  --  34 45* 82*  ALT 134* 107*  --  98* 95* 125*  ALKPHOS 72 61  --  72 73 101  BILITOT 0.9 0.8  --  0.9 1.0 1.1  PROT 5.1* 5.0*  --  5.3* 5.2* 5.6*  ALBUMIN 2.4* 2.4*  --  2.5* 2.5* 2.6*  INR 1.6*  --  1.6* 1.4* 1.5* 1.5*     INFECTIOUS Recent Labs  Lab 10/04/20 0522 10/04/20 1952 10/05/20 0315 10/05/20 0316 10/05/20 1644 10/05/20 1646  LATICACIDVEN 2.3* 3.6*  --  3.2*  --  6.7*  PROCALCITON <0.10  --  <0.10  --  0.99  --      ENDOCRINE CBG (last 3)  Recent Labs    10/05/20 0757 10/05/20 1135 10/05/20 1601  GLUCAP 107* 152* 117*         IMAGING  x24h  - image(s) personally visualized  -   highlighted in bold DG CHEST PORT 1 VIEW  Result Date: 10/05/2020 CLINICAL DATA:  ARDS. EXAM: PORTABLE CHEST 1 VIEW COMPARISON:  03/05/2020 at 5:23 a.m. FINDINGS: Patient is rotated to the left. Endotracheal tube has tip 5.5 cm above the carina. Enteric tube courses into the region of the stomach and off the film as tip is not visualized. Right-sided PICC line unchanged. Lungs are adequately inflated demonstrate persistent hazy bilateral perihilar and bibasilar opacification which may be due to interstitial edema versus infection. Possible pleural fluid tracking to the right apex. Cardiomediastinal silhouette and remainder the exam is unchanged. IMPRESSION: 1. Persistent hazy bilateral perihilar and bibasilar opacification which may be due to interstitial edema versus infection. Possible small amount right pleural fluid. 2. Tubes and lines as described. Electronically Signed   By: Marin Olp M.D.   On: 10/05/2020 15:35   DG CHEST PORT 1 VIEW  Result Date: 10/05/2020 CLINICAL DATA:  Ventilator dependence. EXAM: PORTABLE CHEST 1 VIEW COMPARISON:  10/04/2020 FINDINGS: 0529 hours. Endotracheal tube tip is 5.5 cm above the base of the carina. Right PICC line tip overlies the distal SVC level. Diffuse interstitial and basilar airspace opacity is similar to prior. No  substantial pleural effusion. Telemetry leads overlie the chest. IMPRESSION: 1. Endotracheal tube tip is 5.5 cm above the base of the carina. 2. Stable diffuse interstitial and basilar airspace disease. Electronically Signed   By: Misty Stanley M.D.   On: 10/05/2020 06:38   DG Chest Port 1 View  Result Date: 10/04/2020 CLINICAL DATA:  Intubation, COVID-19 EXAM: PORTABLE CHEST 1 VIEW COMPARISON:  Portable exam 1934 hours compared to 0513 hours FINDINGS: Tip of endotracheal tube projects 4.5 cm above carina. Nasogastric tube extends into stomach. RIGHT arm PICC line tip projects over SVC. Normal heart  size, mediastinal contours, and pulmonary vascularity. Airspace infiltrates in the mid to lower lungs bilaterally consistent with multifocal pneumonia and COVID-19. No pleural effusion or pneumothorax. Osseous structures unremarkable. IMPRESSION: Persistent BILATERAL pulmonary infiltrates consistent with multifocal pneumonia and COVID-19, with decreased atelectasis in RIGHT middle lobe since previous exam. Electronically Signed   By: Lavonia Dana M.D.   On: 10/04/2020 20:36      A: Severe ARDS worsening hypoxemia -despite prone positioning Development of circulatory shock Worsening lactic acidosis Worsening severe respiratory acidosis Development of hyperkalemia and possible AKI  P: Continue prone ventilation with Nimbex and deep sedation Levophed for MAP greater than 65 Add vasopressin Might need to consider Giaprezza Increase ventilator settings from a PEEP of 14-16.  Continue with FiO2 100%.  Increased respiratory rate from 28-35. Start bicarb infusion along with bolus Start scheduled Lokelma Fluid bolus Add meropenem (but after blood culture, urine culture and tracheal aspirate] + Vanc   I called the family and spoke to his wife and subsequently to the daughter.  Wife is emotional state.  She says she does not want to see the husband on the ventilator.  I did indicate about significant worsening and likely reason being septic shock.  Did express to them that if they wanted to visit with him at the bedside this should be appropriate.  Patient is at high risk of dying tonight but could potentially stabilize of sepsis under control.  They are aware that the whole course has been rocky with him    Additional 70 minutes critical care time throughout the course of the day since the initial rounding.   Anti-infectives (From admission, onward)   Start     Dose/Rate Route Frequency Ordered Stop   10/03/20 2200  vancomycin (VANCOCIN) IVPB 1000 mg/200 mL premix  Status:  Discontinued         1,000 mg 200 mL/hr over 60 Minutes Intravenous Every 8 hours 10/03/20 1709 10/04/20 0922   10/02/20 0330  vancomycin (VANCOREADY) IVPB 750 mg/150 mL  Status:  Discontinued        750 mg 150 mL/hr over 60 Minutes Intravenous Every 12 hours 10/01/20 1439 10/03/20 1709   10/01/20 1530  ceFEPIme (MAXIPIME) 2 g in sodium chloride 0.9 % 100 mL IVPB  Status:  Discontinued        2 g 200 mL/hr over 30 Minutes Intravenous Every 8 hours 10/01/20 1439 10/04/20 1706   10/01/20 1530  vancomycin (VANCOREADY) IVPB 2000 mg/400 mL        2,000 mg 200 mL/hr over 120 Minutes Intravenous  Once 10/01/20 1439 10/01/20 1739   09/28/20 1200  azithromycin (ZITHROMAX) 500 mg in sodium chloride 0.9 % 250 mL IVPB  Status:  Discontinued        500 mg 250 mL/hr over 60 Minutes Intravenous Every 24 hours 09/28/20 1024 09/29/20 0848   09/28/20 1200  cefTRIAXone (ROCEPHIN) 2 g in  sodium chloride 0.9 % 100 mL IVPB  Status:  Discontinued        2 g 200 mL/hr over 30 Minutes Intravenous Every 24 hours 09/28/20 1024 09/30/20 1025   09/21/20 1530  remdesivir 100 mg in sodium chloride 0.9 % 100 mL IVPB        100 mg 200 mL/hr over 30 Minutes Intravenous Once 09/21/20 1437 09/21/20 1655   09/18/20 1000  remdesivir 100 mg in sodium chloride 0.9 % 100 mL IVPB  Status:  Discontinued        100 mg 200 mL/hr over 30 Minutes Intravenous Daily 09/17/20 0010 09/17/20 0908   09/17/20 1000  remdesivir 100 mg in sodium chloride 0.9 % 100 mL IVPB  Status:  Discontinued       "Followed by" Linked Group Details   100 mg 200 mL/hr over 30 Minutes Intravenous Daily 09/13/2020 2340 09/17/20 0009   09/17/20 0908  remdesivir 100 mg in sodium chloride 0.9 % 100 mL IVPB        100 mg 200 mL/hr over 30 Minutes Intravenous Daily 09/17/20 0908 09/21/20 0959   09/17/20 0015  remdesivir 100 mg in sodium chloride 0.9 % 100 mL IVPB        100 mg 200 mL/hr over 30 Minutes Intravenous Every 30 min 09/17/20 0010 09/17/20 0114   09/13/2020 2345  remdesivir  200 mg in sodium chloride 0.9% 250 mL IVPB  Status:  Discontinued       "Followed by" Linked Group Details   200 mg 580 mL/hr over 30 Minutes Intravenous Once 09/19/2020 2340 09/17/20 0009       Rest per NP/medical resident whose note is outlined above and that I agree with  The patient is critically ill with multiple organ systems failure and requires high complexity decision making for assessment and support, frequent evaluation and titration of therapies, application of advanced monitoring technologies and extensive interpretation of multiple databases.   Critical Care Time devoted to patient care services described in this note is  70  Minutes. This time reflects time of care of this signee Dr Brand Males. This critical care time does not reflect procedure time, or teaching time or supervisory time of PA/NP/Med student/Med Resident etc but could involve care discussion time     Dr. Brand Males, M.D., [facility].C.P Pulmonary and Critical Care Medicine Staff Physician [facility] [facility] Pager: [phone], If no answer or between  15:00h - 7:00h: call [phone]  10/05/2020 6:04 PM

## 2020-10-05 NOTE — Progress Notes (Signed)
CRITICAL VALUE ALERT  Critical Value:  Troponin 123  Date & Time Notied:  10/05/20 1739  Provider Notified: Dr. Chase Caller  Orders Received/Actions taken: No new orders at this time.  Irven Baltimore, RN

## 2020-10-06 ENCOUNTER — Inpatient Hospital Stay (HOSPITAL_COMMUNITY): Payer: Medicare HMO

## 2020-10-06 DIAGNOSIS — U071 COVID-19: Secondary | ICD-10-CM | POA: Diagnosis not present

## 2020-10-06 DIAGNOSIS — J80 Acute respiratory distress syndrome: Secondary | ICD-10-CM | POA: Diagnosis not present

## 2020-10-06 DIAGNOSIS — J9600 Acute respiratory failure, unspecified whether with hypoxia or hypercapnia: Secondary | ICD-10-CM | POA: Diagnosis not present

## 2020-10-06 LAB — CBC
HCT: 33.4 % — ABNORMAL LOW (ref 39.0–52.0)
HCT: 29.5 % — ABNORMAL LOW (ref 39.0–52.0)
Hemoglobin: 10.2 g/dL — ABNORMAL LOW (ref 13.0–17.0)
Hemoglobin: 9.5 g/dL — ABNORMAL LOW (ref 13.0–17.0)
MCH: 32.9 pg (ref 26.0–34.0)
MCH: 33.8 pg (ref 26.0–34.0)
MCHC: 30.5 g/dL (ref 30.0–36.0)
MCHC: 32.2 g/dL (ref 30.0–36.0)
MCV: 107.7 fL — ABNORMAL HIGH (ref 80.0–100.0)
MCV: 105 fL — ABNORMAL HIGH (ref 80.0–100.0)
Platelets: 18 10*3/uL — CL (ref 150–400)
Platelets: 17 10*3/uL — CL (ref 150–400)
RBC: 3.1 MIL/uL — ABNORMAL LOW (ref 4.22–5.81)
RBC: 2.81 MIL/uL — ABNORMAL LOW (ref 4.22–5.81)
RDW: 17.2 % — ABNORMAL HIGH (ref 11.5–15.5)
RDW: 17.3 % — ABNORMAL HIGH (ref 11.5–15.5)
WBC: 11.3 10*3/uL — ABNORMAL HIGH (ref 4.0–10.5)
WBC: 9 10*3/uL (ref 4.0–10.5)
nRBC: 0.9 % — ABNORMAL HIGH (ref 0.0–0.2)
nRBC: 0.6 % — ABNORMAL HIGH (ref 0.0–0.2)

## 2020-10-06 LAB — GLUCOSE, CAPILLARY
Glucose-Capillary: 111 mg/dL — ABNORMAL HIGH (ref 70–99)
Glucose-Capillary: 119 mg/dL — ABNORMAL HIGH (ref 70–99)
Glucose-Capillary: 124 mg/dL — ABNORMAL HIGH (ref 70–99)
Glucose-Capillary: 129 mg/dL — ABNORMAL HIGH (ref 70–99)
Glucose-Capillary: 132 mg/dL — ABNORMAL HIGH (ref 70–99)
Glucose-Capillary: 150 mg/dL — ABNORMAL HIGH (ref 70–99)

## 2020-10-06 LAB — COMPREHENSIVE METABOLIC PANEL
ALT: 122 U/L — ABNORMAL HIGH (ref 0–44)
AST: 84 U/L — ABNORMAL HIGH (ref 15–41)
Albumin: 2.2 g/dL — ABNORMAL LOW (ref 3.5–5.0)
Alkaline Phosphatase: 77 U/L (ref 38–126)
Anion gap: 12 (ref 5–15)
BUN: 61 mg/dL — ABNORMAL HIGH (ref 8–23)
CO2: 32 mmol/L (ref 22–32)
Calcium: 8.3 mg/dL — ABNORMAL LOW (ref 8.9–10.3)
Chloride: 109 mmol/L (ref 98–111)
Creatinine, Ser: 1.36 mg/dL — ABNORMAL HIGH (ref 0.61–1.24)
GFR, Estimated: 55 mL/min — ABNORMAL LOW (ref >60)
Glucose, Bld: 169 mg/dL — ABNORMAL HIGH (ref 70–99)
Potassium: 5.2 mmol/L — ABNORMAL HIGH (ref 3.5–5.1)
Sodium: 153 mmol/L — ABNORMAL HIGH (ref 135–145)
Total Bilirubin: 1.5 mg/dL — ABNORMAL HIGH (ref 0.3–1.2)
Total Protein: 4.6 g/dL — ABNORMAL LOW (ref 6.5–8.1)

## 2020-10-06 LAB — POCT I-STAT 7, (LYTES, BLD GAS, ICA,H+H)
Acid-Base Excess: 9 mmol/L — ABNORMAL HIGH (ref 0.0–2.0)
Bicarbonate: 35.4 mmol/L — ABNORMAL HIGH (ref 20.0–28.0)
Calcium, Ion: 1.15 mmol/L (ref 1.15–1.40)
HCT: 28 % — ABNORMAL LOW (ref 39.0–52.0)
Hemoglobin: 9.5 g/dL — ABNORMAL LOW (ref 13.0–17.0)
O2 Saturation: 95 %
Patient temperature: 96.8
Potassium: 4.4 mmol/L (ref 3.5–5.1)
Sodium: 151 mmol/L — ABNORMAL HIGH (ref 135–145)
TCO2: 37 mmol/L — ABNORMAL HIGH (ref 22–32)
pCO2 arterial: 53.5 mmHg — ABNORMAL HIGH (ref 32.0–48.0)
pH, Arterial: 7.425 (ref 7.350–7.450)
pO2, Arterial: 75 mmHg — ABNORMAL LOW (ref 83.0–108.0)

## 2020-10-06 LAB — PHOSPHORUS: Phosphorus: 6.1 mg/dL — ABNORMAL HIGH (ref 2.5–4.6)

## 2020-10-06 LAB — CK TOTAL AND CKMB (NOT AT ARMC)
CK, MB: 7.8 ng/mL — ABNORMAL HIGH (ref 0.5–5.0)
Relative Index: 5.3 — ABNORMAL HIGH (ref 0.0–2.5)
Total CK: 146 U/L (ref 49–397)

## 2020-10-06 LAB — CULTURE, BLOOD (ROUTINE X 2)
Culture: NO GROWTH
Culture: NO GROWTH
Special Requests: ADEQUATE
Special Requests: ADEQUATE

## 2020-10-06 LAB — MAGNESIUM: Magnesium: 2.7 mg/dL — ABNORMAL HIGH (ref 1.7–2.4)

## 2020-10-06 LAB — PROCALCITONIN: Procalcitonin: 4.03 ng/mL

## 2020-10-06 LAB — FIBRINOGEN: Fibrinogen: 150 mg/dL — ABNORMAL LOW (ref 210–475)

## 2020-10-06 LAB — ANTINUCLEAR ANTIBODIES, IFA: ANA Ab, IFA: NEGATIVE

## 2020-10-06 LAB — APTT: aPTT: 30 seconds (ref 24–36)

## 2020-10-06 LAB — CORTISOL: Cortisol, Plasma: 49.9 ug/dL

## 2020-10-06 LAB — LACTIC ACID, PLASMA: Lactic Acid, Venous: 5.3 mmol/L (ref 0.5–1.9)

## 2020-10-06 MED ORDER — VANCOMYCIN HCL 750 MG/150ML IV SOLN
750.0000 mg | Freq: Two times a day (BID) | INTRAVENOUS | Status: DC
  Administered 2020-10-07 – 2020-10-08 (×3): 750 mg via INTRAVENOUS
  Filled 2020-10-06 (×4): qty 150

## 2020-10-06 NOTE — Progress Notes (Signed)
eLink Physician-Brief Progress Note Patient Name: Javier Peterson DOB: [birth year] MRN: [id number]   Date of Service  10/06/2020  HPI/Events of Note  Patient having PAC's with compensatory pauses. Last K+ = 4.4 and last Mg++ = 2.7. Patient is on a small dose of Norepinephrine IV infusion.   eICU Interventions  Plan: 1. Continue to wean Norepinephrine IV infusion as tolerated.      Intervention Category Major Interventions: Arrhythmia - evaluation and management  Serenity Fortner Eugene 10/06/2020, 8:25 PM

## 2020-10-06 NOTE — Plan of Care (Signed)
  Problem: Education: Goal: Knowledge of risk factors and measures for prevention of condition will improve Outcome: Not Progressing   Problem: Coping: Goal: Psychosocial and spiritual needs will be supported Outcome: Not Progressing   Problem: Respiratory: Goal: Will maintain a patent airway Outcome: Not Progressing

## 2020-10-06 NOTE — Progress Notes (Signed)
Pts head turned to the left and left arm was moved up. Pts vitals are stable at this time. RT will continue to monitor.

## 2020-10-06 NOTE — Progress Notes (Signed)
NAME:  Javier Peterson, MRN:  [id number], DOB:  01-08-1946, LOS: 32 ADMISSION DATE:  09/26/2020, CONSULTATION DATE:  12/15 REFERRING MD:  Tana Coast, CHIEF COMPLAINT:  Respiratory failure and COVID    Brief History    74 year old nonvaccinated male admitted 12/7 with fever and dyspnea.  Also cough as well as syncopal episode at home.  Hypoxic in the emergency room requiring 8 to 9 L via nasal cannula to keep sats at 90% Covid positive. Admitted to the internal medicine service therapeutic interventions included the following treatment for COVID-19 infection and associated respiratory failure: Supplemental oxygen initially high flow, this is been persistent and also required addition of nonrebreather, active proning position, IV steroids, remdesivir, as well as 2 doses of Actemra received on 12/9 and 12/10. Over the course of his hospitalization he has remained on high flow oxygen, requiring nonrebreather mask as well as high flow oxygen Ranging from 35 L flow now up to 60 L flow, after some difficulty trying to get out of bed this morning.  Because of high oxygen demand, and tenuous respiratory status pulmonary asked to evaluate for possible transfer to the intensive care   There is no immunization history on file for this patient.   Past Medical History  Glaucoma Not vaccinated   [facility]   12/7 admitted started on remdesivir and systemic steroids  12/9: First dose of Actemra  12/10: Second dose of Actemra  12/15 remains on high flow oxygen, as well as nonrebreather.  Had episode this morning requiring titration of oxygen up and took approximately < 1 minute to recover because of this pulmonary asked to evaluate for possible transfer to ICU  12/20 PM -  PM rounds - resp muscule fatigue and intubated . He is limited code.  Hypotensive post intubation,. ECHO with mild RV strain, HAs NEW - DVT - Findings consistent with acute deep vein thrombosis involving the right  posterior tibial veins, and right peroneal veins.  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 On 90% fio2, peep 14 -> pulse ox 89%,. Off levophed. On fent gtt alone. Awake and oriented but dysynchronous  12/22 - -supine, on fent gttt, precedex gtt, 80% fio2, peep 14. Not on pressors. No bleeding. On argatroban Platelet low yesterday - still pending result today. HIT Ab - negative.  Making urine. More synchronous with vent. Has new fever 101F. Argatroban stopped  12/23 - start nimbex. Continue prone/supine ( fever appears to be responding to antibiotics.  Patient completed prone positioning at this morning was turned to supine.  He still on 90% FiO2 and a PEEP of 14.  He is on fentanyl infusion and Precedex infusion.  He is able to nod to voice appropriately.  He did get dyssynchronous with the ventilator and with endotracheal tube cuff reinflation things improved.)  . HIT AB - negaitive from 09/29/20 and 12/21  12/24 -  lactate 3.5 and stll up. On fent gtt, versed gtt, nimbex gtt. On pressors levophed gtt. Vent -> 60%./pee[p14 On cycle #2 prone currently. Plat continues to drop - 27k. Fever has improved. Bronc yestrday - ET tube 3cm above carina. DIC panel yesterday positive / Schistocyte negative on smear   Consults:  Critical care consulted 12/15  Procedures:  ETT 12/20 Aline 12/20 PICC 12/20 Bronch for et tube position 12/23  Significant Diagnostic Tests:  CT angiogram 12/15>>>not done LE [place] 12/11: neg LE  [place] 12/17>>> neg for DVT  LE [place] - 12/20 - HAs NEW - DVT - Findings consistent with acute deep vein thrombosis involving the right posterior tibial veins, and right peroneal veins  Micro Data:  12/7 resp panel RT-PCR >> + COVID 12/7 BC x 2 >> neg 12/9 MRSA PCR  >> neg xxxx 12/20 - trach aspirate ? Sent 12/22 blood  Antimicrobials:/anticovid  12/9 actemra 12/20 actemra  ? Start date - systemic  steroid -> reduced solumedrol 20mg  daily 12/25/221 -. Stopped 12/26  Remdesivir completed on 12/12 azithro 12/19 >>12/20 xxxx Ceftriaxone 12/19 (afebrle since 12/17) >>12/20 xxxx vanc 12/22 > 12/25 (worsening platelets d/w Dr 1/26) Cefepime 12/22 > 12/25( worsennig platelets) Vancomycin 12/26 >>  Meropenem 12/26 >>   Interim history/subjective:   Back to supine position this am 12/27 Bicarb gtt infusing >> repeat ABG Remains thrombocytopenic, no schisto on smear; currently off all anticoagulation Endo 200, Versed 5 Vasopressin 0.03, norepinephrine 15 Remains paralyzed, Nimbex FiO2 0.80, PEEP 14 dilated in the prone position Started on vancomycin, meropenem on 12/26   Objective   Blood pressure 139/72, pulse (!) 56, temperature (!) 96.8 F (36 C), temperature source Axillary, resp. rate (!) 35, height 6\' 2"  (1.88 m), weight 116.8 kg, SpO2 93 %.    Vent Mode: PRVC FiO2 (%):  [70 %-100 %] 80 % Set Rate:  [28 bmp-35 bmp] 35 bmp Vt Set:  [620 mL] 620 mL PEEP:  [12 cmH20-16 cmH20] 14 cmH20 Plateau Pressure:  [30 cmH20-42 cmH20] 41 cmH20   Intake/Output Summary (Last 24 hours) at 10/06/2020 0936 Last data filed at 10/06/2020 0900 Gross per 24 hour  Intake 6103.66 ml  Output 1575 ml  Net 4528.66 ml   Filed Weights   10/04/20 0500 10/05/20 0321 10/06/20 0308  Weight: 113.7 kg 118.4 kg 116.8 kg    General Appearance: Critically ill-appearing man, ventilated, now in spine position HEENT: ET tube in good position, oropharynx otherwise clear, OG tube. Neck: No lymphadenopathy, unable to assess JVP Lungs: Coarse bilateral breath sounds, 0.80, 14 PEEP Heart: Regular, distant, bradycardic, no murmur Abdomen: Nondistended, positive bowel sounds Extremities: 1+ lower extremity edema Skin: No rash Neurologic: Paralyzed   Resolved Hospital Problem list     Assessment & Plan:  Acute hypoxic respiratory failure in the setting of Covid pneumonia and ARDS -unable to get CT  chest due to instability. Intubation 09/29/20 and s/p TPA empiric PE 09/29/20 with confirmed DVT -Completed remdesivir -Completed Actemra, last dose 12/10 -Has received systemic steroids - s/p TPA 09/29/20 (supected PE in setting of DVT and some RV strain), enoxaparin and then subsequently argatroban held given thrombocytopenia - undergoing prone positioning, remains on paralytics  Plan -Continue current lung protective ventilator strategy, 6cc/kg -Wean PEEP, FiO2 as able -Has been on bicarbonate infusion for mixed acidosis, based on most recent ABG can likely wean to off on 12/27 -Continue to cycle prone positioning -Continue paralytics for now, consider attempt transition off 12/28 -Diuresis if/when he can tolerate, currently dealing with renal insufficiency and also hypernatremia  Suspected sepsis, source unclear  Plan -Start empiric vancomycin, meropenem 12/26 -Blood cultures and tracheal aspirate pending   Shock, question septic shock plus effect of sedating medications Plan -Norepinephrine 15, vasopressin 0.03, wean as able for goal MAP 65 -Follow lactic acidosis for clearance   Acute renal failure, nonoliguric Hypernatremia  plan -Continue free water -Restore adequate renal perfusion -Follow BMP, urine output -Avoid diuresis at this time -Renal dose medications  Thrombocytopenia - onset 09/26/20  RLE DVT + 12/20 (was negative 12/17)  HITT ab neg 12/20 and 12/21.    Schistocytes negative on smear 12/23  Plan -Anticoagulation held in the setting of very low platelets -Would transfuse, give cryo if any evidence of bleeding and platelets less than 100 -In absence of bleeding goal platelets greater than 10k   Hyperkalemia Hypernatremia  Plan -Continue Lokelma scheduled for now, follow potassium, hopefully transition to off -Free water as ordered  Transaminitis  Plan -Follow CMP  Best practice (evaluated daily)   Diet: TF via OG since  12/20 Pain/Anxiety/Delirium protocol (if indicated): Not indicated VAP protocol (if indicated): YES since 12/20 DVT prophylaxis:  scd  GI prophylaxis:  PPI Glucose control: stable- continue to monitor q 4 while on steroids  Mobility: Bed rest Family: spoke with wife by phone 12/27 to update. We have planned for a family meeting on 12/29 at 13:00 here at the hospital. Judeen Hammans and her daughter will be able to attend    GOALS OF CARE     Multi-Disciplinary Goals of Care Discussion Date of Discussion 09/26/2020 an 12/19  09/29/20   Primary service for patient     Location of discussion     Family and Staff present Marni Griffon, patient and wife Dr Lamonte Sakai on phone with wife Judeen Hammans Dr Chase Caller, April RN, wife and daughter at 35m bedside conference room  Summary of discussion Reversed code status to full code including short term 5-7 days aggressive ventilator care if needed nlcuing pressors but not long term support and no HD Full code. Intubate + if needed Wife says she is married him to 69  Years and will be difficult to see him on ventilator though it might be c/w goals his care -> l;ater at bedside with wife and daughter -> wife says they are devout christians and know of people esp 1 other devoute christian who was on vent for 55 days and survived. They undersand such miracle is not possible always but wife believes patient sees parallel with that . In addition, h eis a fighter and previiously well and he is afraid of leaving wife behind without a  Fight. She did says no CPR if he arrests and is not interested in prolonged mech ventilato  Followup goals of care due by   Wife invited to bedside - d/w April charge RN  Misc comments if any   No CPR. If he codes during intubation -> no CPR - d/w Wife and daughter and they affirmed it      Status: Full medical care but no CPR  Disposition: ICU     ATTESTATION & SIGNATURE   Independent CC time 34 minutes  Baltazar Apo, MD,  PhD 10/06/2020, 9:37 AM [facility] [phone] or if no answer [phone]     LABS    PULMONARY Recent Labs  Lab 10/04/20 2002 10/05/20 0449 10/05/20 1104 10/05/20 1617 10/05/20 2155  PHART 7.335* 7.373 7.358 7.128* 7.278*  PCO2ART 56.5* 54.9* 57.6* 93.6* 68.5*  PO2ART 46* 67* 57* 64* 77*  HCO3 30.6* 31.8* 32.1* 30.7* 32.0*  TCO2 32 33* 34* 33* 34*  O2SAT 81.0 91.0 86.0 80.0 93.0    CBC Recent Labs  Lab 10/05/20 0315 10/05/20 0449 10/05/20 2145 10/05/20 2146 10/05/20 2155 10/06/20 0300  HGB 10.8*   < >  --  11.1* 10.9* 10.2*  HCT 33.7*   < >  --  36.7* 32.0* 33.4*  WBC 9.3  --   --  8.6  --  11.3*  PLT 41*  --  20* 20*  --  18*   < > = values in this interval not displayed.    COAGULATION Recent Labs  Lab 10/04/20 0942 10/04/20 1952 10/05/20 0315 10/05/20 1644 10/05/20 2145  INR 1.6* 1.4* 1.5* 1.5* 1.6*    CARDIAC  No results for input(s): TROPONINI in the last 168 hours. No results for input(s): PROBNP in the last 168 hours.   CHEMISTRY Recent Labs  Lab 10/02/20 0352 10/02/20 1114 10/03/20 0348 10/03/20 2026 10/04/20 0522 10/04/20 0840 10/04/20 0942 10/04/20 1952 10/04/20 2002 10/05/20 0315 10/05/20 0449 10/05/20 1617 10/05/20 1644 10/05/20 2142 10/05/20 2155 10/06/20 0300  NA 149*   < > 147*   < > 151*   < >  --  149*   < > 151*   < > 154* 155* 156* 153* 153*  K 4.9   < > 4.1   < > 4.8   < >  --  5.3*   < > 4.7   < > 4.9 5.2* 5.5* 5.3* 5.2*  CL 112*  --  114*  --  112*  --   --  113*  --  115*  --   --  116* 113*  --  109  CO2 28  --  28  --  28  --   --  30  --  29  --   --  29 31  --  32  GLUCOSE 114*  --  149*  --  89  --   --  173*  --  116*  --   --  134* 136*  --  169*  BUN 38*  --  45*  --  43*  --   --  42*  --  45*  --   --  52* 57*  --  61*  CREATININE 0.84  --  0.72  --  0.68  --   --  0.69  --  0.80  --   --  1.10 1.26*  --  1.36*  CALCIUM 7.9*  --  8.0*  --  8.1*  --   --  8.3*  --  8.4*   --   --  8.4* 8.5*  --  8.3*  MG 2.8*  --   --   --  2.8*  --  2.6*  --   --   --   --   --  2.9*  --   --  2.7*  PHOS 3.5  --  4.0  --  3.9  --   --   --   --  3.7  --   --  6.7*  --   --  6.1*   < > = values in this interval not displayed.   Estimated Creatinine Clearance: 65.7 mL/min (A) (by C-G formula based on SCr of 1.36 mg/dL (H)).   LIVER Recent Labs  Lab 10/04/20 0522 10/04/20 0942 10/04/20 1952 10/05/20 0315 10/05/20 1644 10/05/20 2145 10/06/20 0300  AST 35  --  34 45* 82*  --  84*  ALT 107*  --  98* 95* 125*  --  122*  ALKPHOS 61  --  72 73 101  --  77  BILITOT 0.8  --  0.9 1.0 1.1  --  1.5*  PROT 5.0*  --  5.3* 5.2* 5.6*  --  4.6*  ALBUMIN 2.4*  --  2.5* 2.5* 2.6*  --  2.2*  INR  --  1.6*  1.4* 1.5* 1.5* 1.6*  --      INFECTIOUS Recent Labs  Lab 10/05/20 0315 10/05/20 0316 10/05/20 1644 10/05/20 1646 10/06/20 0300  LATICACIDVEN  --  3.2*  --  6.7* 5.3*  PROCALCITON <0.10  --  0.99  --  4.03     ENDOCRINE CBG (last 3)  Recent Labs    10/05/20 2308 10/06/20 0314 10/06/20 0752  GLUCAP 118* 150* 129*

## 2020-10-06 NOTE — Progress Notes (Signed)
Pts head turned to the right and right arm in place. RT will continue to monitor.

## 2020-10-06 NOTE — Progress Notes (Signed)
Pharmacy Antibiotic Note  Javier Peterson is a 74 y.o. male admitted on 10/10/2020 with sepsis.   Patient is s/p 4 days of cefepime and vancomycin, then stopped but now with increasing lactate so vancomycin and Merrem started.  AKI - SCr up to 1.36, CrCL 65 ml/min, afebrile, WBC 11.3, LA 5.3, PCT 4.  Plan: Change vanc to 750mg  IV Q12H for goal trough 15-20 mcg/mL Continue Merrem 2gm IV Q8H Monitor renal fxn, clinical progress, vanc trough as needed  Height: 6\' 2"  (188 cm) Weight: 116.8 kg (257 lb 8 oz) IBW/kg (Calculated) : 82.2  Temp (24hrs), Avg:97.8 F (36.6 C), Min:96.8 F (36 C), Max:99.7 F (37.6 C)  Recent Labs  Lab 10/03/20 1532 10/03/20 1533 10/04/20 0522 10/04/20 0942 10/04/20 1952 10/05/20 0315 10/05/20 0316 10/05/20 1644 10/05/20 1646 10/05/20 2142 10/05/20 2146 10/06/20 0300  WBC  --    < > 9.4 9.8  --  9.3  --   --   --   --  8.6 11.3*  CREATININE  --   --  0.68  --  0.69 0.80  --  1.10  --  1.26*  --  1.36*  LATICACIDVEN  --    < > 2.3*  --  3.6*  --  3.2*  --  6.7*  --   --  5.3*  VANCOTROUGH 6*  --   --   --   --   --   --   --   --   --   --   --    < > = values in this interval not displayed.    Estimated Creatinine Clearance: 65.7 mL/min (A) (by C-G formula based on SCr of 1.36 mg/dL (H)).    No Active Allergies  CTX 12/19 >> 12/21 Azith 12/19 >> 12/20 Cefepime 12/22>>12/26 Vanc 12/22>> 12/25, restarted 12/26 >> Merrem 12/26 >>   Remdesivir 12/8 >> 12/12 Actemra 12/9 and 12/10 Steroid for covid 12/7 >> 12/22  12/7 BCx - negative 12/9 MRSA PCR - negative 12/22 BCx - negative 12/26 TA - GPR/GNR/GPC on Gram stain 12/26 BCx - NGTD  Tracye Szuch D. 1/27, PharmD, BCPS, BCCCP 10/06/2020, 12:05 PM

## 2020-10-06 NOTE — Progress Notes (Signed)
Patient placed in supine position at this time. Patient suctioned before and after. RN assist x4.

## 2020-10-07 ENCOUNTER — Inpatient Hospital Stay (HOSPITAL_COMMUNITY): Payer: Medicare HMO

## 2020-10-07 DIAGNOSIS — J9600 Acute respiratory failure, unspecified whether with hypoxia or hypercapnia: Secondary | ICD-10-CM | POA: Diagnosis not present

## 2020-10-07 DIAGNOSIS — J80 Acute respiratory distress syndrome: Secondary | ICD-10-CM | POA: Diagnosis not present

## 2020-10-07 DIAGNOSIS — U071 COVID-19: Secondary | ICD-10-CM | POA: Diagnosis not present

## 2020-10-07 LAB — GLUCOSE, CAPILLARY
Glucose-Capillary: 100 mg/dL — ABNORMAL HIGH (ref 70–99)
Glucose-Capillary: 105 mg/dL — ABNORMAL HIGH (ref 70–99)
Glucose-Capillary: 108 mg/dL — ABNORMAL HIGH (ref 70–99)
Glucose-Capillary: 116 mg/dL — ABNORMAL HIGH (ref 70–99)
Glucose-Capillary: 122 mg/dL — ABNORMAL HIGH (ref 70–99)
Glucose-Capillary: 95 mg/dL (ref 70–99)

## 2020-10-07 LAB — COMPREHENSIVE METABOLIC PANEL
ALT: 119 U/L — ABNORMAL HIGH (ref 0–44)
AST: 60 U/L — ABNORMAL HIGH (ref 15–41)
Albumin: 1.9 g/dL — ABNORMAL LOW (ref 3.5–5.0)
Alkaline Phosphatase: 66 U/L (ref 38–126)
Anion gap: 10 (ref 5–15)
BUN: 80 mg/dL — ABNORMAL HIGH (ref 8–23)
CO2: 34 mmol/L — ABNORMAL HIGH (ref 22–32)
Calcium: 7.7 mg/dL — ABNORMAL LOW (ref 8.9–10.3)
Chloride: 107 mmol/L (ref 98–111)
Creatinine, Ser: 1.31 mg/dL — ABNORMAL HIGH (ref 0.61–1.24)
GFR, Estimated: 57 mL/min — ABNORMAL LOW (ref >60)
Glucose, Bld: 149 mg/dL — ABNORMAL HIGH (ref 70–99)
Potassium: 4.1 mmol/L (ref 3.5–5.1)
Sodium: 151 mmol/L — ABNORMAL HIGH (ref 135–145)
Total Bilirubin: 0.9 mg/dL (ref 0.3–1.2)
Total Protein: 4.1 g/dL — ABNORMAL LOW (ref 6.5–8.1)

## 2020-10-07 LAB — POCT I-STAT 7, (LYTES, BLD GAS, ICA,H+H)
Acid-Base Excess: 15 mmol/L — ABNORMAL HIGH (ref 0.0–2.0)
Bicarbonate: 40.6 mmol/L — ABNORMAL HIGH (ref 20.0–28.0)
Calcium, Ion: 1.16 mmol/L (ref 1.15–1.40)
HCT: 26 % — ABNORMAL LOW (ref 39.0–52.0)
Hemoglobin: 8.8 g/dL — ABNORMAL LOW (ref 13.0–17.0)
O2 Saturation: 89 %
Patient temperature: 97.7
Potassium: 3.9 mmol/L (ref 3.5–5.1)
Sodium: 150 mmol/L — ABNORMAL HIGH (ref 135–145)
TCO2: 42 mmol/L — ABNORMAL HIGH (ref 22–32)
pCO2 arterial: 56 mmHg — ABNORMAL HIGH (ref 32.0–48.0)
pH, Arterial: 7.467 — ABNORMAL HIGH (ref 7.350–7.450)
pO2, Arterial: 54 mmHg — ABNORMAL LOW (ref 83.0–108.0)

## 2020-10-07 LAB — CBC
HCT: 27.9 % — ABNORMAL LOW (ref 39.0–52.0)
Hemoglobin: 8.5 g/dL — ABNORMAL LOW (ref 13.0–17.0)
MCH: 32.4 pg (ref 26.0–34.0)
MCHC: 30.5 g/dL (ref 30.0–36.0)
MCV: 106.5 fL — ABNORMAL HIGH (ref 80.0–100.0)
Platelets: 17 10*3/uL — CL (ref 150–400)
RBC: 2.62 MIL/uL — ABNORMAL LOW (ref 4.22–5.81)
RDW: 17.2 % — ABNORMAL HIGH (ref 11.5–15.5)
WBC: 6.5 10*3/uL (ref 4.0–10.5)
nRBC: 0.8 % — ABNORMAL HIGH (ref 0.0–0.2)

## 2020-10-07 LAB — PROCALCITONIN: Procalcitonin: 1.23 ng/mL

## 2020-10-07 LAB — PHOSPHORUS: Phosphorus: 4.5 mg/dL (ref 2.5–4.6)

## 2020-10-07 LAB — LACTIC ACID, PLASMA: Lactic Acid, Venous: 3.1 mmol/L (ref 0.5–1.9)

## 2020-10-07 MED ORDER — SODIUM ZIRCONIUM CYCLOSILICATE 10 G PO PACK
10.0000 g | PACK | Freq: Every day | ORAL | Status: DC
  Administered 2020-10-07 – 2020-10-08 (×2): 10 g
  Filled 2020-10-07 (×2): qty 1

## 2020-10-07 NOTE — Progress Notes (Signed)
Pts head moved to the left and left arm was moved up without any complications. RT will continue to monitor.

## 2020-10-07 NOTE — Progress Notes (Signed)
Pts head turned to the right and arm placed on the right without any complications. Rt will continue to monitor.

## 2020-10-07 NOTE — Progress Notes (Signed)
Pt hollister tube holder removed and protective barriers placed on the pts cheeks and upper lip. Pt ETT resecured at 26cm at the lip with cloth tape. Pt then placed into the prone position with RT x2 and RN x4.  RT will continue to monitor and be available as needed.

## 2020-10-07 NOTE — Progress Notes (Signed)
NAME:  Javier Peterson, MRN:  [id number], DOB:  03-Sep-1946, LOS: 66 ADMISSION DATE:  10/02/2020, CONSULTATION DATE:  12/15 REFERRING MD:  Tana Coast, CHIEF COMPLAINT:  Respiratory failure and COVID    Brief History    74 year old nonvaccinated male admitted 12/7 with fever and dyspnea.  Also cough as well as syncopal episode at home.  Hypoxic in the emergency room requiring 8 to 9 L via nasal cannula to keep sats at 90% Covid positive. Admitted to the internal medicine service therapeutic interventions included the following treatment for COVID-19 infection and associated respiratory failure: Supplemental oxygen initially high flow, this is been persistent and also required addition of nonrebreather, active proning position, IV steroids, remdesivir, as well as 2 doses of Actemra received on 12/9 and 12/10. Over the course of his hospitalization he has remained on high flow oxygen, requiring nonrebreather mask as well as high flow oxygen Ranging from 35 L flow now up to 60 L flow, after some difficulty trying to get out of bed this morning.  Because of high oxygen demand, and tenuous respiratory status pulmonary asked to evaluate for possible transfer to the intensive care   There is no immunization history on file for this patient.   Past Medical History  Glaucoma Not vaccinated   [facility]   12/7 admitted started on remdesivir and systemic steroids  12/9: First dose of Actemra  12/10: Second dose of Actemra  12/15 remains on high flow oxygen, as well as nonrebreather.  Had episode this morning requiring titration of oxygen up and took approximately < 1 minute to recover because of this pulmonary asked to evaluate for possible transfer to ICU  12/20 PM -  PM rounds - resp muscule fatigue and intubated . He is limited code.  Hypotensive post intubation,. ECHO with mild RV strain, HAs NEW - DVT - Findings consistent with acute deep vein thrombosis involving the right  posterior tibial veins, and right peroneal veins.  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 On 90% fio2, peep 14 -> pulse ox 89%,. Off levophed. On fent gtt alone. Awake and oriented but dysynchronous  12/22 - -supine, on fent gttt, precedex gtt, 80% fio2, peep 14. Not on pressors. No bleeding. On argatroban Platelet low yesterday - still pending result today. HIT Ab - negative.  Making urine. More synchronous with vent. Has new fever 101F. Argatroban stopped  12/23 - start nimbex. Continue prone/supine ( fever appears to be responding to antibiotics.  Patient completed prone positioning at this morning was turned to supine.  He still on 90% FiO2 and a PEEP of 14.  He is on fentanyl infusion and Precedex infusion.  He is able to nod to voice appropriately.  He did get dyssynchronous with the ventilator and with endotracheal tube cuff reinflation things improved.)  . HIT AB - negaitive from 09/29/20 and 12/21  12/24 -  lactate 3.5 and stll up. On fent gtt, versed gtt, nimbex gtt. On pressors levophed gtt. Vent -> 60%./pee[p14 On cycle #2 prone currently. Plat continues to drop - 27k. Fever has improved. Bronc yestrday - ET tube 3cm above carina. DIC panel yesterday positive / Schistocyte negative on smear   Consults:  Critical care consulted 12/15  Procedures:  ETT 12/20 Aline 12/20 PICC 12/20 Bronch for et tube position 12/23  Significant Diagnostic Tests:  CT angiogram 12/15>>>not done LE [place] 12/11: neg LE  [place] 12/17>>> neg for DVT  LE [place] - 12/20 - HAs NEW - DVT - Findings consistent with acute deep vein thrombosis involving the right posterior tibial veins, and right peroneal veins  Micro Data:  12/7 resp panel RT-PCR >> + COVID 12/7 BC x 2 >> neg 12/9 MRSA PCR  >> neg xxxx 12/20 - trach aspirate ? Sent 12/22 blood >> negative Respiratory 12/26 >> few GPC R, few GNR, rare GPC >> Blood 12/26 >>    Antimicrobials:/anticovid  12/9 actemra 12/20 actemra  ? Start date - systemic steroid -> reduced solumedrol 20mg  daily 12/25/221 -. Stopped 12/26  Remdesivir completed on 12/12 azithro 12/19 >>12/20 xxxx Ceftriaxone 12/19 (afebrle since 12/17) >>12/20 xxxx vanc 12/22 > 12/25 (worsening platelets d/w Dr 1/26) Cefepime 12/22 > 12/25( worsennig platelets) Vancomycin 12/26 >>  Meropenem 12/26 >>   Interim history/subjective:  Cycled to prone position overnight I/O+ 15 .6 L total, up to 10 L since 12/24 Sodium 151, serum creatinine stable 1.31 Lactate 5.3 >> 3.1 Platelets 17 K, hemoglobin slow steady drop 9.5 >> 8.5 Remains paralyzed Fentanyl 200, Versed 3 Norepinephrine 1 Vasopressin off remains on stress dose hydrocortisone since 12/26    Objective   Blood pressure (!) 154/64, pulse 73, temperature 97.7 F (36.5 C), temperature source Oral, resp. rate (!) 35, height 6\' 2"  (1.88 m), weight 123.5 kg, SpO2 94 %.    Vent Mode: PRVC FiO2 (%):  [60 %-70 %] 60 % Set Rate:  [35 bmp] 35 bmp Vt Set:  [620 mL] 620 mL PEEP:  [14 cmH20] 14 cmH20 Plateau Pressure:  [35 cmH20-43 cmH20] 43 cmH20   Intake/Output Summary (Last 24 hours) at 10/07/2020 0851 Last data filed at 10/07/2020 0700 Gross per 24 hour  Intake 4543.1 ml  Output 1050 ml  Net 3493.1 ml   Filed Weights   10/05/20 0321 10/06/20 0308 10/07/20 0500  Weight: 118.4 kg 116.8 kg 123.5 kg    General Appearance: Critically ill, supine, ventilated, paralyzed HEENT: ET tube in place, oropharynx otherwise clear, OG tube Neck: No apparent lymphadenopathy Lungs: Scattered inspiratory crackles at both bases, no wheezing, 0.60, PEEP 14 Heart: Distant, regular, bradycardic, no murmur Abdomen: Nondistended positive bowel sounds Extremities: 1+ lower extremity edema Skin: No rash Neurologic: Paralyzed   Resolved Hospital Problem list     Assessment & Plan:  Acute hypoxic respiratory failure in the setting of  Covid pneumonia and ARDS -unable to get CT chest due to instability. Intubation 09/29/20 and s/p TPA empiric PE 09/29/20 with confirmed DVT -Completed remdesivir and Actemra -Received steroids, now currently on stress dose hydrocortisone -Received empiric TPA at time of decompensation (suspected PE in setting of DVT and right heart strain), then enoxaparin and subsequently argatroban.  All anticoagulation has been held given his severe thrombocytopenia -Undergoing prone positioning, paralytics, remains on these 12/28 -No diuresis for several days 12/28 Plan -Acidosis is improved, plan to decrease VT to 6 cc/kg and follow ABG -Discontinue bicarbonate infusion 12/28 -Wean PEEP and FiO2 as able -Follow chest x-ray and ABG -Cycle prone positioning -Consider transition off paralytics in the next 24-48 hours -Diuresis if/when he can tolerate.  Renal insufficiency appears to have stabilized but remains hypernatremic  Suspected sepsis, source unclear  Plan -Continue empiric vancomycin, meropenem pending respiratory culture data -Follow cultures to completion   Shock, question septic shock plus effect of sedating medications Plan -Norepinephrine down to 1, vasopressin off.  Plan to try to wean off for MAP 65.  Suspect that volume resuscitation  is been beneficial.  Consider discontinue stress dose hydrocortisone -Repeat lactic acid if any hemodynamic instability   Acute renal failure, nonoliguric Hypernatremia  plan -Continue current free water -Follow BMP, urine output -Consider restart diuresis next 24 hours -Renal dose medications  Thrombocytopenia - onset 09/26/20  RLE DVT + 12/20 (was negative 12/17)  HITT ab neg 12/20 and 12/21.    Schistocytes negative on smear 12/23, 1226  Plan -Continue to hold anticoagulation, high risk bleeding given platelets 17 K -Would transfuse, give cryo if any evidence of bleeding and platelets less than 100 K -In absence of bleeding goal platelets  > 10K   Hyperkalemia Hypernatremia  Plan -Decrease scheduled Lokelma to daily and follow BMP -Continue current free water and follow sodium  Transaminitis  Plan Follow intermittent LFT  Best practice (evaluated daily)   Diet: TF via OG since 12/20 Pain/Anxiety/Delirium protocol (if indicated): Not indicated VAP protocol (if indicated): YES since 12/20 DVT prophylaxis:  scd  GI prophylaxis:  PPI Glucose control: stable- continue to monitor q 4 while on steroids  Mobility: Bed rest Family: spoke with wife by phone 12/28 to update. We have planned for a family meeting on 12/29 at 13:00 here at the hospital. Judeen Hammans and her daughter will be able to attend    GOALS OF CARE     Multi-Disciplinary Goals of Care Discussion Date of Discussion 09/26/2020 an 12/19  09/29/20   Primary service for patient     Location of discussion     Family and Staff present Marni Griffon, patient and wife Dr Lamonte Sakai on phone with wife Judeen Hammans Dr Chase Caller, April RN, wife and daughter at 31m bedside conference room  Summary of discussion Reversed code status to full code including short term 5-7 days aggressive ventilator care if needed nlcuing pressors but not long term support and no HD Full code. Intubate + if needed Wife says she is married him to 41  Years and will be difficult to see him on ventilator though it might be c/w goals his care -> l;ater at bedside with wife and daughter -> wife says they are devout christians and know of people esp 1 other devoute christian who was on vent for 55 days and survived. They undersand such miracle is not possible always but wife believes patient sees parallel with that . In addition, h eis a fighter and previiously well and he is afraid of leaving wife behind without a  Fight. She did says no CPR if he arrests and is not interested in prolonged mech ventilato  Followup goals of care due by   Wife invited to bedside - d/w April charge RN  Misc comments if any   No  CPR. If he codes during intubation -> no CPR - d/w Wife and daughter and they affirmed it      Status: Full medical care but no CPR  Disposition: ICU     ATTESTATION & SIGNATURE   Independent CC time 33 minutes  Baltazar Apo, MD, PhD 10/07/2020, 8:51 AM [facility] [phone] or if no answer [phone]     LABS    PULMONARY Recent Labs  Lab 10/05/20 0449 10/05/20 1104 10/05/20 1617 10/05/20 2155 10/06/20 0950  PHART 7.373 7.358 7.128* 7.278* 7.425  PCO2ART 54.9* 57.6* 93.6* 68.5* 53.5*  PO2ART 67* 57* 64* 77* 75*  HCO3 31.8* 32.1* 30.7* 32.0* 35.4*  TCO2 33* 34* 33* 34* 37*  O2SAT 91.0 86.0 80.0 93.0 95.0    CBC Recent  Labs  Lab 10/06/20 0300 10/06/20 0950 10/06/20 1800 10/07/20 0202  HGB 10.2* 9.5* 9.5* 8.5*  HCT 33.4* 28.0* 29.5* 27.9*  WBC 11.3*  --  9.0 6.5  PLT 18*  --  17* 17*    COAGULATION Recent Labs  Lab 10/04/20 0942 10/04/20 1952 10/05/20 0315 10/05/20 1644 10/05/20 2145  INR 1.6* 1.4* 1.5* 1.5* 1.6*    CARDIAC  No results for input(s): TROPONINI in the last 168 hours. No results for input(s): PROBNP in the last 168 hours.   CHEMISTRY Recent Labs  Lab 10/02/20 0352 10/02/20 1114 10/04/20 0522 10/04/20 0840 10/04/20 0942 10/04/20 1952 10/05/20 0315 10/05/20 0449 10/05/20 1644 10/05/20 2142 10/05/20 2155 10/06/20 0300 10/06/20 0950 10/07/20 0202  NA 149*   < > 151*   < >  --    < > 151*   < > 155* 156* 153* 153* 151* 151*  K 4.9   < > 4.8   < >  --    < > 4.7   < > 5.2* 5.5* 5.3* 5.2* 4.4 4.1  CL 112*   < > 112*  --   --    < > 115*  --  116* 113*  --  109  --  107  CO2 28   < > 28  --   --    < > 29  --  29 31  --  32  --  34*  GLUCOSE 114*   < > 89  --   --    < > 116*  --  134* 136*  --  169*  --  149*  BUN 38*   < > 43*  --   --    < > 45*  --  52* 57*  --  61*  --  80*  CREATININE 0.84   < > 0.68  --   --    < > 0.80  --  1.10 1.26*  --  1.36*  --  1.31*  CALCIUM 7.9*   < > 8.1*   --   --    < > 8.4*  --  8.4* 8.5*  --  8.3*  --  7.7*  MG 2.8*  --  2.8*  --  2.6*  --   --   --  2.9*  --   --  2.7*  --   --   PHOS 3.5   < > 3.9  --   --   --  3.7  --  6.7*  --   --  6.1*  --  4.5   < > = values in this interval not displayed.   Estimated Creatinine Clearance: 70.1 mL/min (A) (by C-G formula based on SCr of 1.31 mg/dL (H)).   LIVER Recent Labs  Lab 10/04/20 0942 10/04/20 1952 10/05/20 0315 10/05/20 1644 10/05/20 2145 10/06/20 0300 10/07/20 0202  AST  --  34 45* 82*  --  84* 60*  ALT  --  98* 95* 125*  --  122* 119*  ALKPHOS  --  72 73 101  --  77 66  BILITOT  --  0.9 1.0 1.1  --  1.5* 0.9  PROT  --  5.3* 5.2* 5.6*  --  4.6* 4.1*  ALBUMIN  --  2.5* 2.5* 2.6*  --  2.2* 1.9*  INR 1.6* 1.4* 1.5* 1.5* 1.6*  --   --      INFECTIOUS Recent Labs  Lab 10/05/20 1644 10/05/20 1646 10/06/20 0300  10/07/20 0202  LATICACIDVEN  --  6.7* 5.3* 3.1*  PROCALCITON 0.99  --  4.03 1.23     ENDOCRINE CBG (last 3)  Recent Labs    10/06/20 2333 10/07/20 0340 10/07/20 0715  GLUCAP 119* 105* 100*

## 2020-10-07 NOTE — Plan of Care (Signed)
  Problem: Respiratory: Goal: Will maintain a patent airway Outcome: Not Progressing Goal: Complications related to the disease process, condition or treatment will be avoided or minimized Outcome: Not Progressing   

## 2020-10-07 NOTE — Progress Notes (Signed)
Pts head turned to the left and left arm was moved without any complications. RT will continue to monitor.  

## 2020-10-07 NOTE — Progress Notes (Signed)
RT NOTES: Assisted nursing with placing patient back in supine position. ETT secured by commercial ETT holder.

## 2020-10-08 ENCOUNTER — Inpatient Hospital Stay (HOSPITAL_COMMUNITY): Payer: Medicare HMO

## 2020-10-08 DIAGNOSIS — U071 COVID-19: Secondary | ICD-10-CM | POA: Diagnosis not present

## 2020-10-08 DIAGNOSIS — J80 Acute respiratory distress syndrome: Secondary | ICD-10-CM | POA: Diagnosis not present

## 2020-10-08 DIAGNOSIS — L8990 Pressure ulcer of unspecified site, unspecified stage: Secondary | ICD-10-CM | POA: Diagnosis present

## 2020-10-08 DIAGNOSIS — J9600 Acute respiratory failure, unspecified whether with hypoxia or hypercapnia: Secondary | ICD-10-CM | POA: Diagnosis not present

## 2020-10-08 LAB — CBC
HCT: 35 % — ABNORMAL LOW (ref 39.0–52.0)
HCT: 33.2 % — ABNORMAL LOW (ref 39.0–52.0)
Hemoglobin: 10.4 g/dL — ABNORMAL LOW (ref 13.0–17.0)
Hemoglobin: 10.1 g/dL — ABNORMAL LOW (ref 13.0–17.0)
MCH: 32.9 pg (ref 26.0–34.0)
MCH: 34.2 pg — ABNORMAL HIGH (ref 26.0–34.0)
MCHC: 29.7 g/dL — ABNORMAL LOW (ref 30.0–36.0)
MCHC: 30.4 g/dL (ref 30.0–36.0)
MCV: 110.8 fL — ABNORMAL HIGH (ref 80.0–100.0)
MCV: 112.5 fL — ABNORMAL HIGH (ref 80.0–100.0)
Platelets: 24 10*3/uL — CL (ref 150–400)
Platelets: 22 10*3/uL — CL (ref 150–400)
RBC: 3.16 MIL/uL — ABNORMAL LOW (ref 4.22–5.81)
RBC: 2.95 MIL/uL — ABNORMAL LOW (ref 4.22–5.81)
RDW: 17.9 % — ABNORMAL HIGH (ref 11.5–15.5)
RDW: 18.4 % — ABNORMAL HIGH (ref 11.5–15.5)
WBC: 6.7 10*3/uL (ref 4.0–10.5)
WBC: 4.7 10*3/uL (ref 4.0–10.5)
nRBC: 0.7 % — ABNORMAL HIGH (ref 0.0–0.2)
nRBC: 1.5 % — ABNORMAL HIGH (ref 0.0–0.2)

## 2020-10-08 LAB — BASIC METABOLIC PANEL
Anion gap: 8 (ref 5–15)
BUN: 94 mg/dL — ABNORMAL HIGH (ref 8–23)
CO2: 38 mmol/L — ABNORMAL HIGH (ref 22–32)
Calcium: 8 mg/dL — ABNORMAL LOW (ref 8.9–10.3)
Chloride: 109 mmol/L (ref 98–111)
Creatinine, Ser: 1.43 mg/dL — ABNORMAL HIGH (ref 0.61–1.24)
GFR, Estimated: 52 mL/min — ABNORMAL LOW (ref >60)
Glucose, Bld: 136 mg/dL — ABNORMAL HIGH (ref 70–99)
Potassium: 4.6 mmol/L (ref 3.5–5.1)
Sodium: 155 mmol/L — ABNORMAL HIGH (ref 135–145)

## 2020-10-08 LAB — GLUCOSE, CAPILLARY
Glucose-Capillary: 100 mg/dL — ABNORMAL HIGH (ref 70–99)
Glucose-Capillary: 112 mg/dL — ABNORMAL HIGH (ref 70–99)
Glucose-Capillary: 124 mg/dL — ABNORMAL HIGH (ref 70–99)
Glucose-Capillary: 85 mg/dL (ref 70–99)
Glucose-Capillary: 69 mg/dL — ABNORMAL LOW (ref 70–99)
Glucose-Capillary: 103 mg/dL — ABNORMAL HIGH (ref 70–99)
Glucose-Capillary: 114 mg/dL — ABNORMAL HIGH (ref 70–99)

## 2020-10-08 LAB — COMPREHENSIVE METABOLIC PANEL
ALT: 118 U/L — ABNORMAL HIGH (ref 0–44)
AST: 52 U/L — ABNORMAL HIGH (ref 15–41)
Albumin: 2.2 g/dL — ABNORMAL LOW (ref 3.5–5.0)
Alkaline Phosphatase: 89 U/L (ref 38–126)
Anion gap: 7 (ref 5–15)
BUN: 86 mg/dL — ABNORMAL HIGH (ref 8–23)
CO2: 40 mmol/L — ABNORMAL HIGH (ref 22–32)
Calcium: 8.1 mg/dL — ABNORMAL LOW (ref 8.9–10.3)
Chloride: 108 mmol/L (ref 98–111)
Creatinine, Ser: 1.39 mg/dL — ABNORMAL HIGH (ref 0.61–1.24)
GFR, Estimated: 54 mL/min — ABNORMAL LOW (ref >60)
Glucose, Bld: 108 mg/dL — ABNORMAL HIGH (ref 70–99)
Potassium: 5.2 mmol/L — ABNORMAL HIGH (ref 3.5–5.1)
Sodium: 155 mmol/L — ABNORMAL HIGH (ref 135–145)
Total Bilirubin: 0.5 mg/dL (ref 0.3–1.2)
Total Protein: 5.1 g/dL — ABNORMAL LOW (ref 6.5–8.1)

## 2020-10-08 LAB — POCT I-STAT 7, (LYTES, BLD GAS, ICA,H+H)
Acid-Base Excess: 13 mmol/L — ABNORMAL HIGH (ref 0.0–2.0)
Bicarbonate: 41.8 mmol/L — ABNORMAL HIGH (ref 20.0–28.0)
Calcium, Ion: 1.16 mmol/L (ref 1.15–1.40)
HCT: 30 % — ABNORMAL LOW (ref 39.0–52.0)
Hemoglobin: 10.2 g/dL — ABNORMAL LOW (ref 13.0–17.0)
O2 Saturation: 77 %
Patient temperature: 97.9
Potassium: 4.4 mmol/L (ref 3.5–5.1)
Sodium: 153 mmol/L — ABNORMAL HIGH (ref 135–145)
TCO2: 44 mmol/L — ABNORMAL HIGH (ref 22–32)
pCO2 arterial: 78.7 mmHg (ref 32.0–48.0)
pH, Arterial: 7.331 — ABNORMAL LOW (ref 7.350–7.450)
pO2, Arterial: 46 mmHg — ABNORMAL LOW (ref 83.0–108.0)

## 2020-10-08 LAB — CULTURE, RESPIRATORY W GRAM STAIN: Culture: NORMAL

## 2020-10-08 LAB — PROTIME-INR
INR: 1.4 — ABNORMAL HIGH (ref 0.8–1.2)
Prothrombin Time: 16.3 seconds — ABNORMAL HIGH (ref 11.4–15.2)

## 2020-10-08 LAB — MAGNESIUM: Magnesium: 3.4 mg/dL — ABNORMAL HIGH (ref 1.7–2.4)

## 2020-10-08 MED ORDER — SODIUM ZIRCONIUM CYCLOSILICATE 10 G PO PACK
10.0000 g | PACK | Freq: Once | ORAL | Status: AC
  Administered 2020-10-08: 10 g
  Filled 2020-10-08: qty 1

## 2020-10-08 MED ORDER — SODIUM ZIRCONIUM CYCLOSILICATE 5 G PO PACK
10.0000 g | PACK | Freq: Two times a day (BID) | ORAL | Status: DC
  Administered 2020-10-09: 10 g
  Filled 2020-10-08: qty 2

## 2020-10-08 MED ORDER — SODIUM ZIRCONIUM CYCLOSILICATE 10 G PO PACK: 10.0000 g | PACK | Freq: Two times a day (BID) | ORAL | Status: DC

## 2020-10-08 MED ORDER — FREE WATER
400.0000 mL | Freq: Four times a day (QID) | Status: DC
  Administered 2020-10-08 – 2020-10-09 (×3): 400 mL

## 2020-10-08 NOTE — Progress Notes (Signed)
Pt transported to 33M 05 on the ventilator without incident.

## 2020-10-08 NOTE — Progress Notes (Addendum)
eLink Physician-Brief Progress Note Patient Name: Javier Peterson DOB: [birth year] MRN: [id number]   Date of Service  10/08/2020  HPI/Events of Note  Hyperkalemia - K+ = 5.2. Patient has scheduled Lokelma at 10 AM. Hypermagnesemia - Mg++ = 3.4. Not on Mg++ replacement or TPN. Creatinine = 1.39. Likely d/t tube feeds.   eICU Interventions  Plan: 1. Lokelma 10 gm per tube now.  2. Repeat BMP at 2 PM.  3. Defer to dietitian and rounding team changing to tube feeds which less Mg++.      Intervention Category Major Interventions: Electrolyte abnormality - evaluation and management  Jamarl Pew [place] 10/08/2020, 5:25 AM

## 2020-10-08 NOTE — Progress Notes (Addendum)
NAME:  Javier Peterson, MRN:  [id number], DOB:  1946-05-09, LOS: 20 ADMISSION DATE:  09/23/20, CONSULTATION DATE:  12/15 REFERRING MD:  Isidoro Donning, CHIEF COMPLAINT:  Respiratory failure and COVID    Brief History    74 year old nonvaccinated male admitted 12/7 with fever and dyspnea.  Also cough as well as syncopal episode at home.  Hypoxic in the emergency room requiring 8 to 9 L via nasal cannula to keep sats at 90% Covid positive. Admitted to the internal medicine service therapeutic interventions included the following treatment for COVID-19 infection and associated respiratory failure: Supplemental oxygen initially high flow, this is been persistent and also required addition of nonrebreather, active proning position, IV steroids, remdesivir, as well as 2 doses of Actemra received on 12/9 and 12/10. Over the course of his hospitalization he has remained on high flow oxygen, requiring nonrebreather mask as well as high flow oxygen Ranging from 35 L flow now up to 60 L flow, after some difficulty trying to get out of bed this morning.  Because of high oxygen demand, and tenuous respiratory status pulmonary asked to evaluate for possible transfer to the intensive care   There is no immunization history on file for this patient.   Past Medical History  Glaucoma Not vaccinated   Significant Hospital Events   12/7 admitted started on remdesivir and systemic steroids  12/9: First dose of Actemra  12/10: Second dose of Actemra  12/15 remains on high flow oxygen, as well as nonrebreather.  Had episode this morning requiring titration of oxygen up and took approximately < 1 minute to recover because of this pulmonary asked to evaluate for possible transfer to ICU  12/20 PM -  PM rounds - resp muscule fatigue and intubated . He is limited code.  Hypotensive post intubation,. ECHO with mild RV strain, HAs NEW - DVT - Findings consistent with acute deep vein thrombosis involving the right  posterior tibial veins, and right peroneal veins.  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 On 90% fio2, peep 14 -> pulse ox 89%,. Off levophed. On fent gtt alone. Awake and oriented but dysynchronous  12/22 - -supine, on fent gttt, precedex gtt, 80% fio2, peep 14. Not on pressors. No bleeding. On argatroban Platelet low yesterday - still pending result today. HIT Ab - negative.  Making urine. More synchronous with vent. Has new fever 101F. Argatroban stopped  12/23 - start nimbex. Continue prone/supine ( fever appears to be responding to antibiotics.  Patient completed prone positioning at this morning was turned to supine.  He still on 90% FiO2 and a PEEP of 14.  He is on fentanyl infusion and Precedex infusion.  He is able to nod to voice appropriately.  He did get dyssynchronous with the ventilator and with endotracheal tube cuff reinflation things improved.)  . HIT AB - negaitive from 09/29/20 and 12/21  12/24 -  lactate 3.5 and stll up. On fent gtt, versed gtt, nimbex gtt. On pressors levophed gtt. Vent -> 60%./pee[p14 On cycle #2 prone currently. Plat continues to drop - 27k. Fever has improved. Bronc yestrday - ET tube 3cm above carina. DIC panel yesterday positive / Schistocyte negative on smear   Consults:  Critical care consulted 12/15  Procedures:  ETT 12/20 Aline 12/20 PICC 12/20 Bronch for et tube position 12/23  Significant Diagnostic Tests:  CT angiogram 12/15>>>not done LE [place] 12/11: neg LE  [place] 12/17>>> neg for DVT  LE [place] - 12/20 - HAs NEW - DVT - Findings consistent with acute deep vein thrombosis involving the right posterior tibial veins, and right peroneal veins  Micro Data:  12/7 resp panel RT-PCR >> + COVID 12/7 BC x 2 >> neg 12/9 MRSA PCR  >> neg xxxx 12/20 - trach aspirate ? Sent 12/22 blood >> negative Respiratory 12/26 >> normal flora Blood 12/26 >>    Antimicrobials:/anticovid  12/9 actemra 12/20 actemra  ? Start date - systemic steroid -> reduced solumedrol 20mg  daily 12/25/221 -. Stopped 12/26  Remdesivir completed on 12/12 azithro 12/19 >>12/20 xxxx Ceftriaxone 12/19 (afebrle since 12/17) >>12/20 xxxx vanc 12/22 > 12/25 (worsening platelets d/w Dr Alvy Bimler) Cefepime 12/22 > 12/25( worsennig platelets) Vancomycin 12/26 >>  Meropenem 12/26 >>   Interim history/subjective:   To prone position o/n >> 0.80 + 14 lokelma decreased 12/28, additional dose given o/n for K 5.2 Na 155 << 151 plt 24 << 17 Paralyzed, fent 200, versed 7 norepi off   Objective   Blood pressure (!) 143/69, pulse 95, temperature 97.9 F (36.6 C), temperature source Oral, resp. rate (!) 34, height 6\' 2"  (1.88 m), weight 124.7 kg, SpO2 99 %.    Vent Mode: PRVC FiO2 (%):  [60 %-100 %] 80 % Set Rate:  [34 bmp-35 bmp] 34 bmp Vt Set:  [490 mL-620 mL] 490 mL PEEP:  [14 cmH20] 14 cmH20 Plateau Pressure:  [21 cmH20-43 cmH20] 21 cmH20   Intake/Output Summary (Last 24 hours) at 10/08/2020 0726 Last data filed at 10/08/2020 0600 Gross per 24 hour  Intake 2376.72 ml  Output 1290 ml  Net 1086.72 ml   Filed Weights   10/06/20 0308 10/07/20 0500 10/08/20 0253  Weight: 116.8 kg 123.5 kg 124.7 kg    General Appearance: Critically ill, in prone position HEENT: ET tube in place, head turned to the right, oropharynx otherwise clear Neck: Not examined Lungs: Coarse bilaterally, scattered inspiratory crackles, no wheezing Heart: Unable to hear in prone position Abdomen: Flanks examined Extremities: Trace to 1+ lower extremity edema Skin: No rash Neurologic: Paralyzed  Chest x-ray from 12/29 still pending  Resolved Hospital Problem list     Assessment & Plan:  Acute hypoxic respiratory failure in the setting of Covid pneumonia and ARDS -unable to get CT chest due to instability. Intubation 09/29/20 and s/p TPA empiric PE 09/29/20 with confirmed  DVT -Completed remdesivir and Actemra -Received steroids, now currently on stress dose hydrocortisone -Received empiric TPA at time of decompensation (suspected PE in setting of DVT and right heart strain), then enoxaparin and subsequently argatroban.  All anticoagulation has been held given his severe thrombocytopenia -Undergoing prone positioning, paralytics, remains on these 12/28 -No diuresis for several days 12/28 Plan -Continue PRVC 6 cc/kg. -Cyclical proning, back to supine position planned morning 12/29 -Follow chest x-ray -Check ABG once back in supine position 12/29 -Continue to wean PEEP, FiO2 as able -Goal work on coming off paralytics 12/29 once back in supine position -Unable to initiate diuresis at this time due to his hyponatremia, renal status  Septic Shock due to COVID19 PNA, present on admission Plan -Respiratory culture data not yet finalized but no evident staph or Pseudomonas. -norepi weaned to off -goal d/c stres dose hydrocort 12/29  Plan to continue meropenem, stop vancomycin and follow cultures, chest x-ray  Acute renal failure, nonoliguric Hypernatremia plan -Increase free water 12/29 -Follow BMP, urine output -Continue to follow for potential to restart diuresis, must defer  again 12/29 Renal dose medications  Thrombocytopenia - onset 09/26/20  RLE DVT + 12/20 (was negative 12/17)  HITT ab neg 12/20 and 12/21.    Schistocytes negative on smear 12/23, 1226 Plan -Holding anticoagulation.  Platelets stable 12/29 without transfusion -Transfuse, give cryo for any evidence of bleeding if platelets less than 100 K -In absence of bleeding then goal platelets > 10K  Hyperkalemia Hypernatremia Plan -Increase scheduled Lokelma back to twice a day -Increased free water as above 12/29  Transaminitis, stable Plan -Follow intermittent LFT  Best practice (evaluated daily)   Diet: TF via OG since 12/20 Pain/Anxiety/Delirium protocol (if indicated): Not  indicated VAP protocol (if indicated): YES since 12/20 DVT prophylaxis:  scd  GI prophylaxis:  PPI Glucose control: stable- continue to monitor q 4 while on steroids  Mobility: Bed rest Family: spoke with wife by phone 12/28 to update. We have planned for a family meeting on 12/29 at 13:00 here at the hospital. Judeen Hammans and her daughter will be able to attend    GOALS OF CARE     Multi-Disciplinary Goals of Care Discussion Date of Discussion 09/26/2020 an 12/19  09/29/20   Primary service for patient     Location of discussion     Family and Staff present Marni Griffon, patient and wife Dr Lamonte Sakai on phone with wife Judeen Hammans Dr Chase Caller, April RN, wife and daughter at 59m bedside conference room  Summary of discussion Reversed code status to full code including short term 5-7 days aggressive ventilator care if needed nlcuing pressors but not long term support and no HD Full code. Intubate + if needed Wife says she is married him to 63  Years and will be difficult to see him on ventilator though it might be c/w goals his care -> l;ater at bedside with wife and daughter -> wife says they are devout christians and know of people esp 1 other devoute christian who was on vent for 55 days and survived. They undersand such miracle is not possible always but wife believes patient sees parallel with that . In addition, h eis a fighter and previiously well and he is afraid of leaving wife behind without a  Fight. She did says no CPR if he arrests and is not interested in prolonged mech ventilato  Followup goals of care due by   Wife invited to bedside - d/w April charge RN  Misc comments if any   No CPR. If he codes during intubation -> no CPR - d/w Wife and daughter and they affirmed it      Status: Full medical care but no CPR  Disposition: ICU     ATTESTATION & SIGNATURE   Independent CC time 35 minutes  Baltazar Apo, MD, PhD 10/08/2020, 7:26 AM [facility] [phone] or if no answer [phone]     LABS    PULMONARY Recent Labs  Lab 10/05/20 1104 10/05/20 1617 10/05/20 2155 10/06/20 0950 10/07/20 1052  PHART 7.358 7.128* 7.278* 7.425 7.467*  PCO2ART 57.6* 93.6* 68.5* 53.5* 56.0*  PO2ART 57* 64* 77* 75* 54*  HCO3 32.1* 30.7* 32.0* 35.4* 40.6*  TCO2 34* 33* 34* 37* 42*  O2SAT 86.0 80.0 93.0 95.0 89.0    CBC Recent Labs  Lab 10/06/20 1800 10/07/20 0202 10/07/20 1052 10/08/20 0230  HGB 9.5* 8.5* 8.8* 10.4*  HCT 29.5* 27.9* 26.0* 35.0*  WBC 9.0 6.5  --  6.7  PLT 17* 17*  --  24*    COAGULATION Recent  Labs  Lab 10/04/20 1952 10/05/20 0315 10/05/20 1644 10/05/20 2145 10/08/20 0230  INR 1.4* 1.5* 1.5* 1.6* 1.4*    CARDIAC  No results for input(s): TROPONINI in the last 168 hours. No results for input(s): PROBNP in the last 168 hours.   CHEMISTRY Recent Labs  Lab 10/04/20 0522 10/04/20 0840 10/04/20 [id number] 10/04/20 1952 10/05/20 0315 10/05/20 0449 10/05/20 1644 10/05/20 2142 10/05/20 2155 10/06/20 0300 10/06/20 0950 10/07/20 0202 10/07/20 1052 10/08/20 0230  NA 151*   < >  --    < > 151*   < > 155* 156*   < > 153* 151* 151* 150* 155*  K 4.8   < >  --    < > 4.7   < > 5.2* 5.5*   < > 5.2* 4.4 4.1 3.9 5.2*  CL 112*  --   --    < > 115*  --  116* 113*  --  109  --  107  --  108  CO2 28  --   --    < > 29  --  29 31  --  32  --  34*  --  40*  GLUCOSE 89  --   --    < > 116*  --  134* 136*  --  169*  --  149*  --  108*  BUN 43*  --   --    < > 45*  --  52* 57*  --  61*  --  80*  --  86*  CREATININE 0.68  --   --    < > 0.80  --  1.10 1.26*  --  1.36*  --  1.31*  --  1.39*  CALCIUM 8.1*  --   --    < > 8.4*  --  8.4* 8.5*  --  8.3*  --  7.7*  --  8.1*  MG 2.8*  --  2.6*  --   --   --  2.9*  --   --  2.7*  --   --   --  3.4*  PHOS 3.9  --   --   --  3.7  --  6.7*  --   --  6.1*  --  4.5  --   --    < > = values in this interval not displayed.   Estimated Creatinine Clearance: 66.4 mL/min (A) (by  C-G formula based on SCr of 1.39 mg/dL (H)).   LIVER Recent Labs  Lab 10/04/20 1952 10/05/20 0315 10/05/20 1644 10/05/20 2145 10/06/20 0300 10/07/20 0202 10/08/20 0230  AST 34 45* 82*  --  84* 60* 52*  ALT 98* 95* 125*  --  122* 119* 118*  ALKPHOS 72 73 101  --  77 66 89  BILITOT 0.9 1.0 1.1  --  1.5* 0.9 0.5  PROT 5.3* 5.2* 5.6*  --  4.6* 4.1* 5.1*  ALBUMIN 2.5* 2.5* 2.6*  --  2.2* 1.9* 2.2*  INR 1.4* 1.5* 1.5* 1.6*  --   --  1.4*     INFECTIOUS Recent Labs  Lab 10/05/20 1644 10/05/20 1646 10/06/20 0300 10/07/20 0202  LATICACIDVEN  --  6.7* 5.3* 3.1*  PROCALCITON 0.99  --  4.03 1.23     ENDOCRINE CBG (last 3)  Recent Labs    10/07/20 1947 10/07/20 2347 10/08/20 0334  GLUCAP 122* 116* 100*

## 2020-10-08 NOTE — Progress Notes (Signed)
Pt placed on 100% O2 by RN per persistent desaturation. RT lavaged pt and bag lavaged pt with return of small amount of thick secretions. Pt O2 sat increased to 85-86% RT obtained ABG off Art line and shared results with RN. If O2 does not continue to increase RT/RN to call E-link for further assistance.

## 2020-10-08 NOTE — Plan of Care (Signed)
PCCM Interval Note, Plan of Care Note   I had an opportunity speak with the patient's wife Javier Peterson, daughter Javier Peterson in the ICU.  Dr. Graciela Husbands with cardiology, also close family friend, was present as well.  We talked about Javier Peterson current active issues and therapies, including his respiratory failure, resolving shock, improving renal function, stable anemia and thrombocytopenia.  In particular we talked about the organ system injuries and their impact on the patient's long-term survival, quality of life, potential for eventual ventilator freedom and recovery to his previous functional capacity.  I did explain that unfortunately chances for Javier Peterson to recover to the point that he would find acceptable are very poor.  It is clear to me that how ever much he will recover it will be over a long period of time, likely weeks to months.  Javier Peterson and Javier Peterson shared good insight into Javier Peterson's philosophy for his care.  It is clear that he would not want to be mechanically ventilated indefinitely.  Also, it is clear that months of mechanical ventilation to follow for improvement without any guarantee of improved quality life would not be acceptable to him.  Based on this we are planning for withdrawal of care and transition to comfort.  The family is considering timing, probably in the next couple of days.  I confirmed that we would work to keep [name] stable until they are ready to withdraw.  I confirmed DNR status should he acutely decompensate.  We briefly touched on whether we would reinitiate pressors if he becomes hypotensive.  Ultimately we decided to defer pressors or any other escalation of care if he clinically changes.  Javier Peterson and Javier Peterson will let me know when they are ready to transition off of mechanical ventilation.  In the meantime I will work on trying to lift his COVID-19 isolation since he is now day 22 post diagnosis.  This will facilitate the family's ability to visit.  Independent critical care time 60  minutes  Levy Pupa, MD, PhD 10/08/2020, 2:27 PM [facility] [phone] or if no answer [phone]

## 2020-10-08 NOTE — Procedures (Signed)
Cortrak  Tube Type:  Cortrak - 43 inches Tube Location:  Right nare Initial Placement:  Stomach Secured by: Bridle Technique Used to Measure Tube Placement:  Documented cm marking at nare/ corner of mouth    Cortrak Tube Team Note:  Consult received to place a Cortrak feeding tube.   No x-ray is required. RN may begin using tube.   If the tube becomes dislodged please keep the tube and contact the Cortrak team at [email or web address] ([email or web address]) for replacement.  If after hours and replacement cannot be delayed, place a NG tube and confirm placement with an abdominal x-ray.    Betsey Holiday MS, RD, LDN Please refer to [facility] for RD and/or RD on-call/weekend/after hours pager

## 2020-10-09 ENCOUNTER — Inpatient Hospital Stay (HOSPITAL_COMMUNITY): Payer: Medicare HMO

## 2020-10-09 DIAGNOSIS — J9600 Acute respiratory failure, unspecified whether with hypoxia or hypercapnia: Secondary | ICD-10-CM | POA: Diagnosis not present

## 2020-10-09 DIAGNOSIS — U071 COVID-19: Secondary | ICD-10-CM | POA: Diagnosis not present

## 2020-10-09 LAB — CBC
HCT: 33.9 % — ABNORMAL LOW (ref 39.0–52.0)
Hemoglobin: 9.9 g/dL — ABNORMAL LOW (ref 13.0–17.0)
MCH: 33.1 pg (ref 26.0–34.0)
MCHC: 29.2 g/dL — ABNORMAL LOW (ref 30.0–36.0)
MCV: 113.4 fL — ABNORMAL HIGH (ref 80.0–100.0)
Platelets: 25 10*3/uL — CL (ref 150–400)
RBC: 2.99 MIL/uL — ABNORMAL LOW (ref 4.22–5.81)
RDW: 19.1 % — ABNORMAL HIGH (ref 11.5–15.5)
WBC: 6.3 10*3/uL (ref 4.0–10.5)
nRBC: 4.5 % — ABNORMAL HIGH (ref 0.0–0.2)

## 2020-10-09 LAB — BASIC METABOLIC PANEL
Anion gap: 12 (ref 5–15)
BUN: 104 mg/dL — ABNORMAL HIGH (ref 8–23)
CO2: 36 mmol/L — ABNORMAL HIGH (ref 22–32)
Calcium: 8 mg/dL — ABNORMAL LOW (ref 8.9–10.3)
Chloride: 106 mmol/L (ref 98–111)
Creatinine, Ser: 1.76 mg/dL — ABNORMAL HIGH (ref 0.61–1.24)
GFR, Estimated: 40 mL/min — ABNORMAL LOW (ref >60)
Glucose, Bld: 118 mg/dL — ABNORMAL HIGH (ref 70–99)
Potassium: 5.3 mmol/L — ABNORMAL HIGH (ref 3.5–5.1)
Sodium: 154 mmol/L — ABNORMAL HIGH (ref 135–145)

## 2020-10-09 LAB — GLUCOSE, CAPILLARY
Glucose-Capillary: 106 mg/dL — ABNORMAL HIGH (ref 70–99)
Glucose-Capillary: 102 mg/dL — ABNORMAL HIGH (ref 70–99)

## 2020-10-09 LAB — MAGNESIUM: Magnesium: 3.5 mg/dL — ABNORMAL HIGH (ref 1.7–2.4)

## 2020-10-09 MED ORDER — GLYCOPYRROLATE 0.2 MG/ML IJ SOLN: 0.2000 mg | INTRAMUSCULAR | Status: DC | PRN

## 2020-10-09 MED ORDER — DEXTROSE 5 % IV SOLN: INTRAVENOUS | Status: DC

## 2020-10-09 MED ORDER — DIPHENHYDRAMINE HCL 50 MG/ML IJ SOLN: 25.0000 mg | INTRAMUSCULAR | Status: DC | PRN

## 2020-10-09 MED ORDER — POLYVINYL ALCOHOL 1.4 % OP SOLN
1.0000 [drp] | Freq: Four times a day (QID) | OPHTHALMIC | Status: DC | PRN
  Filled 2020-10-09: qty 15

## 2020-10-09 MED ORDER — ACETAMINOPHEN 325 MG PO TABS: 650.0000 mg | ORAL_TABLET | Freq: Four times a day (QID) | ORAL | Status: DC | PRN

## 2020-10-09 MED ORDER — ACETAMINOPHEN 325 MG PO TABS
650.0000 mg | ORAL_TABLET | Freq: Four times a day (QID) | ORAL | Status: DC | PRN
  Administered 2020-10-09: 650 mg via ORAL
  Filled 2020-10-09: qty 2

## 2020-10-09 MED ORDER — GLYCOPYRROLATE 1 MG PO TABS: 1.0000 mg | ORAL_TABLET | ORAL | Status: DC | PRN

## 2020-10-09 MED ORDER — ACETAMINOPHEN 650 MG RE SUPP: 650.0000 mg | Freq: Four times a day (QID) | RECTAL | Status: DC | PRN

## 2020-10-10 LAB — CULTURE, BLOOD (ROUTINE X 2)
Culture: NO GROWTH
Culture: NO GROWTH

## 2020-10-10 LAB — PATHOLOGIST SMEAR REVIEW

## 2020-10-11 NOTE — Progress Notes (Signed)
Nutrition Brief Note  Chart reviewed. Pt now transitioning to comfort care.  No further nutrition interventions warranted at this time.  Please re-consult as needed.   Lycia Sachdeva H, RD, LDN, CNSC Please refer to Amion for contact information.                                                          

## 2020-10-11 NOTE — Progress Notes (Addendum)
eLink Physician-Brief Progress Note Patient Name: Javier Peterson DOB: 1946/02/04 MRN: [id number]   Date of Service  10/13/2020  HPI/Events of Note  Hypotension - Norepinephrine started by nursing last shift. Patient is DNR with no escalation of care well documented by Dr. Delton Coombes. Hypoxia - Sat = 82% on 100%/P 14. CXR without pneumothorax. R lung appears more opacified than L.   eICU Interventions  Plan: 1. Wean Norepinephrine IV infusion as tolerated keeping MAP >= 60 and do not go back up if patient becomes hypotensive again. 2. Increase PEEP to 16.  3. May do better in L lateral decubitus position given greater evidence of disease in R lung.      Intervention Category Major Interventions: Hypoxemia - evaluation and management;Hypertension - evaluation and management  Lenell Antu 2020/10/13, 2:37 AM

## 2020-10-11 NOTE — Procedures (Signed)
Extubation Procedure Note  Patient Details:   Name: Javier Peterson DOB: 05-Apr-1946 MRN: [id number]   Airway Documentation:    Vent end date: 09/21/2020 Vent end time: 1230   Evaluation  O2 sats: currently acceptable Complications: No apparent complications Patient did tolerate procedure well. Bilateral Breath Sounds: Diminished   No  Toula Moos 09/30/2020, 12:35 PM

## 2020-10-11 NOTE — Progress Notes (Signed)
eLink Physician-Brief Progress Note Patient Name: Javier Peterson DOB: 03-25-1946 MRN: [id number]   Date of Service  2020/10/14  HPI/Events of Note  Hypoxia - Sat = 81% on 100%/P 14. Patient is DNR with no escalation in level of care per bedside nurse.   eICU Interventions  Plan: 1. Portable CXR STAT. If no pneumothorax, increase PEEP.     Intervention Category Major Interventions: Hypoxemia - evaluation and management  Kate Larock Eugene 2020/10/14, 12:06 AM

## 2020-10-11 NOTE — Progress Notes (Signed)
Pt is unresponsive to verbal and physical stimuli. No respirations, absent heart sounds, pupils fixed and dilated. Pronounced dead at 1239 by this RN and Jori Moll. Wife notified.

## 2020-10-11 NOTE — Progress Notes (Signed)
NAME:  Javier Peterson, MRN:  [id number], DOB:  1946/06/03, LOS: 21 ADMISSION DATE:  September 18, 2020, CONSULTATION DATE:  12/15 REFERRING MD:  Isidoro Donning, CHIEF COMPLAINT:  Respiratory failure and COVID    Brief History    75 year old nonvaccinated male admitted 12/7 with fever and dyspnea.  Also cough as well as syncopal episode at home.  Hypoxic in the emergency room requiring 8 to 9 L via nasal cannula to keep sats at 90% Covid positive. Admitted to the internal medicine service therapeutic interventions included the following treatment for COVID-19 infection and associated respiratory failure: Supplemental oxygen initially high flow, this is been persistent and also required addition of nonrebreather, active proning position, IV steroids, remdesivir, as well as 2 doses of Actemra received on 12/9 and 12/10. Over the course of his hospitalization he has remained on high flow oxygen, requiring nonrebreather mask as well as high flow oxygen Ranging from 35 L flow now up to 60 L flow, after some difficulty trying to get out of bed this morning.  Because of high oxygen demand, and tenuous respiratory status pulmonary asked to evaluate for possible transfer to the intensive care  Past Medical History  Glaucoma Not vaccinated   Significant Hospital Events   12/7 admitted started on remdesivir and systemic steroids  12/9: First dose of Actemra  12/10: Second dose of Actemra  12/15 remains on high flow oxygen, as well as nonrebreather.  Had episode this morning requiring titration of oxygen up and took approximately < 1 minute to recover because of this pulmonary asked to evaluate for possible transfer to ICU  12/20 PM -  PM rounds - resp muscule fatigue and intubated . He is limited code.  Hypotensive post intubation,. ECHO with mild RV strain, HAs NEW - DVT - Findings consistent with acute deep vein thrombosis involving the right posterior tibial veins, and right peroneal veins.  Has had PICC ->  INTUBATED  12/21 -  s.p TPA and now on argatroban for confirmed DVT swith suspected HITT and suspected PE. On 90% fio2, peep 14 -> pulse ox 89%,. Off levophed. On fent gtt alone. Awake and oriented but dysynchronous  12/22 - -supine, on fent gttt, precedex gtt, 80% fio2, peep 14. Not on pressors. No bleeding. On argatroban Platelet low yesterday - still pending result today. HIT Ab - negative.  Making urine. More synchronous with vent. Has new fever 101F. Argatroban stopped  12/23 - start nimbex. Continue prone/supine ( fever appears to be responding to antibiotics.  Patient completed prone positioning at this morning was turned to supine.  He still on 90% FiO2 and a PEEP of 14.  He is on fentanyl infusion and Precedex infusion.  He is able to nod to voice appropriately.  He did get dyssynchronous with the ventilator and with endotracheal tube cuff reinflation things improved.)  . HIT AB - negaitive from 09/29/20 and 12/21  12/24 -  lactate 3.5 and stll up. On fent gtt, versed gtt, nimbex gtt. On pressors levophed gtt. Vent -> 60%./pee[p14 On cycle #2 prone currently. Plat continues to drop - 27k. Fever has improved. Bronc yestrday - ET tube 3cm above carina. DIC panel yesterday positive / Schistocyte negative on smear  12/30 GOC discussion with family and Dr. Delton Coombes held 12/29 with discussion to transition to DNR. Morning of 12/30 family has decided to proceed with compassionate libration from vent  Consults:  Critical care consulted 12/15  Procedures:  ETT 12/20 Aline 12/20 PICC 12/20 Bronch for et tube  position 12/23  Significant Diagnostic Tests:  CT angiogram 12/15>>>not done LE [place] 12/11: neg LE [place] 12/17>>> neg for DVT  LE [place] - 12/20 - HAs NEW - DVT - Findings consistent with acute deep vein thrombosis involving the right posterior tibial veins, and right peroneal veins  Micro Data:  12/7 resp panel RT-PCR >> + COVID 12/7 BC x 2 >> neg 12/9 MRSA PCR  >> neg xxxx 12/20 - trach  aspirate ? Sent 12/22 blood >> negative Respiratory 12/26 >> normal flora Blood 12/26 >>   Antimicrobials:/anticovid  12/9 actemra 12/20 actemra  ? Start date - systemic steroid -> reduced solumedrol 20mg  daily 12/25/221 -. Stopped 12/26  Remdesivir completed on 12/12 azithro 12/19 >>12/20 xxxx Ceftriaxone 12/19 (afebrle since 12/17) >>12/20 xxxx vanc 12/22 > 12/25 (worsening platelets d/w Dr Alvy Bimler) Cefepime 12/22 > 12/25( worsennig platelets) Vancomycin 12/26 >>  Meropenem 12/26 >>   Interim history/subjective:  Family at bedside this AM with decision to proceed with compassionate extubation today. They understand the paralytic will need to be discontinued for a period of time before he can be removed from the vent  Objective   Blood pressure (!) 102/54, pulse (!) 101, temperature (!) 102.2 F (39 C), temperature source Axillary, resp. rate (!) 34, height 6\' 2"  (1.88 m), weight 127.1 kg, SpO2 (!) 78 %.    Vent Mode: PRVC FiO2 (%):  [100 %] 100 % Set Rate:  [34 bmp] 34 bmp Vt Set:  [490 mL] 490 mL PEEP:  [14 cmH20-16 cmH20] 16 cmH20 Plateau Pressure:  [30 cmH20-34 cmH20] 34 cmH20   Intake/Output Summary (Last 24 hours) at 11/05/20 [id number] Last data filed at 11-05-20 0700 Gross per 24 hour  Intake 2591.49 ml  Output 980 ml  Net 1611.49 ml   Filed Weights   10/07/20 0500 10/08/20 0253 11-05-20 0420  Weight: 123.5 kg 124.7 kg 127.1 kg   Physical Exam General: Chronically ill appearing elderly pale and mild cyanotic male on mechanical ventilation HEENT: ETT, MM pink/moist, PERRL,  Neuro: sedated and paralyzed  CV: s1s2 regular rate and rhythm, no murmur, rubs, or gallops,  PULM:  Diminished bilaterally GI: soft, bowel sounds active in all 4 quadrants, non-tender, non-distended Extremities: warm/dry, no edema  Skin: no rashes or lesions  Resolved Hospital Problem list     Assessment & Plan:  Acute hypoxic respiratory failure in the setting of Covid pneumonia  and ARDS -unable to get CT chest due to instability. Intubation 09/29/20 and s/p TPA empiric PE 09/29/20 with confirmed DVT -Completed remdesivir and Actemra -Received steroids, now currently on stress dose hydrocortisone -Received empiric TPA at time of decompensation (suspected PE in setting of DVT and right heart strain), then enoxaparin and subsequently argatroban.  All anticoagulation has been held given his severe thrombocytopenia -Undergoing prone positioning, paralytics, remains on these 12/28 -No diuresis for several days 12/28 Suspected sepsis, source unclear Shock, question septic shock plus effect of sedating medications Acute renal failure, nonoliguric Hypernatremia Thrombocytopenia - onset 09/26/20  RLE DVT + 12/20 (was negative 12/17)  HITT ab neg 12/20 and 12/21.    Schistocytes negative on smear 12/23, 1226 Hyperkalemia Hypernatremia Transaminitis, stable Plan Family has decided to move forward with comfort measures and compassionate extubation today. They would not want to be present during the transition process therefore they have decided to go back home. We will stop his paralytics now and allow 3 hrs before we remove his ETT. In the meantime we will focus on comfort measures only.  Best practice (evaluated daily)   Diet: TF via OG since 12/20 Pain/Anxiety/Delirium protocol (if indicated): Not indicated VAP protocol (if indicated): YES since 12/20 DVT prophylaxis:  scd  GI prophylaxis:  PPI Glucose control: stable- continue to monitor q 4 while on steroids  Mobility: Bed rest Family: Updated both wife and daughter at bedside 10/28/2020    GOALS OF CARE    Multi-Disciplinary Goals of Care Discussion Date of Discussion 09/26/2020 an 12/19  09/29/20 10/08/20   Primary service for patient      Location of discussion      Family and Staff present Marni Griffon, patient and wife Dr Lamonte Sakai on phone with wife Judeen Hammans Dr Chase Caller, April RN, wife and daughter at  52m bedside conference room Merlene Laughter NP, Vaughan Basta RN, Wife and Daughter   Summary of discussion Reversed code status to full code including short term 5-7 days aggressive ventilator care if needed nlcuing pressors but not long term support and no HD Full code. Intubate + if needed Wife says she is married him to 26  Years and will be difficult to see him on ventilator though it might be c/w goals his care -> later at bedside with wife and daughter -> wife says they are devout christians and know of people esp 1 other devoute christian who was on vent for 55 days and survived. They undersand such miracle is not possible always but wife believes patient sees parallel with that . In addition, h eis a fighter and previiously well and he is afraid of leaving wife behind without a  Fight. She did says no CPR if he arrests and is not interested in prolonged mech ventilato Transition to comfort measures with pan for compassionate extubation   Followup goals of care due by   Wife invited to bedside - d/w April charge RN N/A  Misc comments if any   No CPR. If he codes during intubation -> no CPR - d/w Wife and daughter and they affirmed it       Status: DNR comfort measures only   Disposition: ICU     ATTESTATION & SIGNATURE  Johnsie Cancel, NP-C [place] / Pager information can be found on Amion  10/28/20, 9:24 AM   LABS    PULMONARY Recent Labs  Lab 10/05/20 1617 10/05/20 2155 10/06/20 0950 10/07/20 1052 10/08/20 1947  PHART 7.128* 7.278* 7.425 7.467* 7.331*  PCO2ART 93.6* 68.5* 53.5* 56.0* 78.7*  PO2ART 64* 77* 75* 54* 46*  HCO3 30.7* 32.0* 35.4* 40.6* 41.8*  TCO2 33* 34* 37* 42* 44*  O2SAT 80.0 93.0 95.0 89.0 77.0    CBC Recent Labs  Lab 10/08/20 0230 10/08/20 1400 10/08/20 1947 10-28-20 0413  HGB 10.4* 10.1* 10.2* 9.9*  HCT 35.0* 33.2* 30.0* 33.9*  WBC 6.7 4.7  --  6.3  PLT 24* 22*  --  25*    COAGULATION Recent Labs  Lab  10/04/20 1952 10/05/20 0315 10/05/20 1644 10/05/20 2145 10/08/20 0230  INR 1.4* 1.5* 1.5* 1.6* 1.4*    CARDIAC  No results for input(s): TROPONINI in the last 168 hours. No results for input(s): PROBNP in the last 168 hours.   CHEMISTRY Recent Labs  Lab 10/04/20 0522 10/04/20 0840 10/04/20 [id number] 10/04/20 1952 10/05/20 0315 10/05/20 0449 10/05/20 1644 10/05/20 2142 10/06/20 0300 10/06/20 0950 10/07/20 0202 10/07/20 1052 10/08/20 0230 10/08/20 1947 10/08/20 2027 10/28/20 0413  NA 151*   < >  --    < > 151*   < >  155*   < > 153*   < > 151* 150* 155* 153* 155* 154*  K 4.8   < >  --    < > 4.7   < > 5.2*   < > 5.2*   < > 4.1 3.9 5.2* 4.4 4.6 5.3*  CL 112*  --   --    < > 115*  --  116*   < > 109  --  107  --  108  --  109 106  CO2 28  --   --    < > 29  --  29   < > 32  --  34*  --  40*  --  38* 36*  GLUCOSE 89  --   --    < > 116*  --  134*   < > 169*  --  149*  --  108*  --  136* 118*  BUN 43*  --   --    < > 45*  --  52*   < > 61*  --  80*  --  86*  --  94* 104*  CREATININE 0.68  --   --    < > 0.80  --  1.10   < > 1.36*  --  1.31*  --  1.39*  --  1.43* 1.76*  CALCIUM 8.1*  --   --    < > 8.4*  --  8.4*   < > 8.3*  --  7.7*  --  8.1*  --  8.0* 8.0*  MG 2.8*  --  2.6*  --   --   --  2.9*  --  2.7*  --   --   --  3.4*  --   --  3.5*  PHOS 3.9  --   --   --  3.7  --  6.7*  --  6.1*  --  4.5  --   --   --   --   --    < > = values in this interval not displayed.   Estimated Creatinine Clearance: 53 mL/min (A) (by C-G formula based on SCr of 1.76 mg/dL (H)).   LIVER Recent Labs  Lab 10/04/20 1952 10/05/20 0315 10/05/20 1644 10/05/20 2145 10/06/20 0300 10/07/20 0202 10/08/20 0230  AST 34 45* 82*  --  84* 60* 52*  ALT 98* 95* 125*  --  122* 119* 118*  ALKPHOS 72 73 101  --  77 66 89  BILITOT 0.9 1.0 1.1  --  1.5* 0.9 0.5  PROT 5.3* 5.2* 5.6*  --  4.6* 4.1* 5.1*  ALBUMIN 2.5* 2.5* 2.6*  --  2.2* 1.9* 2.2*  INR 1.4* 1.5* 1.5* 1.6*  --   --  1.4*      INFECTIOUS Recent Labs  Lab 10/05/20 1644 10/05/20 1646 10/06/20 0300 10/07/20 0202  LATICACIDVEN  --  6.7* 5.3* 3.1*  PROCALCITON 0.99  --  4.03 1.23     ENDOCRINE CBG (last 3)  Recent Labs    10/08/20 2310 10/20/20 0315 20-Oct-2020 0737  GLUCAP 114* 106* 102*

## 2020-10-11 DEATH — deceased

## 2020-10-15 LAB — TYPE AND SCREEN
ABO/RH(D): A POS
Antibody Screen: POSITIVE
PT AG Type: NEGATIVE
Unit division: 0
Unit division: 0

## 2020-10-15 LAB — BPAM RBC
Blood Product Expiration Date: 202201222359
Blood Product Expiration Date: 202201312359
Unit Type and Rh: 6200
Unit Type and Rh: 6200

## 2020-10-22 DIAGNOSIS — G9340 Encephalopathy, unspecified: Secondary | ICD-10-CM | POA: Diagnosis not present

## 2020-10-22 DIAGNOSIS — U071 COVID-19: Secondary | ICD-10-CM | POA: Diagnosis present

## 2020-10-22 DIAGNOSIS — J1282 Pneumonia due to coronavirus disease 2019: Secondary | ICD-10-CM | POA: Diagnosis present

## 2020-10-22 DIAGNOSIS — D696 Thrombocytopenia, unspecified: Secondary | ICD-10-CM | POA: Diagnosis present

## 2020-10-22 DIAGNOSIS — N179 Acute kidney failure, unspecified: Secondary | ICD-10-CM | POA: Diagnosis not present

## 2020-11-11 NOTE — Death Summary Note (Signed)
  DEATH SUMMARY   Patient Details  Name: Javier Peterson MRN: [id number] DOB: 07/08/1946  Admission/Discharge Information   Admit Date:  2020/10/16  Date of Death: Date of Death: 2020-11-08  Time of Death: Time of Death: 1238-02-10  Length of Stay: 03-01-23  Referring Physician: Patient, No Pcp Per   Reason(s) for Hospitalization  Syncopal episode, hypoxemia.   Diagnoses  Preliminary cause of death:   COVID 64 Pneumonia  Secondary Diagnoses (including complications and co-morbidities):  Principal Problem:   Acute respiratory failure due to COVID-19 [facility]) Active Problems:   SOB (shortness of breath)   Syncope   ARDS (adult respiratory distress syndrome) (HCC)   Coagulopathy (HCC)   Septic shock (HCC)   Pressure injury of skin   Thrombocytopenia (HCC)   Pneumonia due to COVID-19 virus   Acute encephalopathy   Acute renal failure (ARF) [facility])   Brief Hospital Course (including significant findings, care, treatment, and services provided and events leading to death)  Javier Peterson is a 75 y.o. year old male with history of glaucoma, recently treated for possible prostatitis prior to admission.  He experienced cough, progressive shortness of breath on the days prior to admission, then a syncopal episode on 17-Oct-2023.  He presented to the emergency room where he was found to have acute hypoxemic respiratory failure, requiring 8 to 9 L/min by nasal cannula.  He was COVID-19 positive, nonvaccinated.  Admitted to internal medicine and received IV steroids, remdesivir, Actemra x2.  Unfortunately had progressive respiratory failure required transfer to the ICU for BiPAP.  He was intubated 12/20 for progressive failure.  Echocardiogram with right heart strain.  He received empiric tPA for suspicion acute pulmonary embolism in the setting of a right posterior tibial DVT discovered on ultrasound.  Respiratory failure progressed significantly and he required high PEEP and FiO2, paralytics, proning.  He  required pressors for septic shock and developed acute renal failure, presumed ATN from septic shock.  Also developed acute thrombocytopenia, H ITT antibody negative and no schistocytes on peripheral smear.  Wound care was initiated for deep tissue pressure injuries on his bilateral heels, stage II pressure injury of the nose (not present on admission).  He was treated empirically for possible superimposed bacterial pneumonia with broad-spectrum antibiotics.  Cultures remained negative.  Despite all aggressive care his respiratory status and ventilator needs did not improve.  Decision was made in discussion with family to transition off mechanical ventilation and to comfort care.  This was done on 11/08/20.  The patient expired 2020-11-08 at 12:39 PM.    Collene Gobble 10/22/2020, 9:08 AM

## 2022-01-24 IMAGING — DX DG ABD PORTABLE 1V
1 series · 1 of 1 positions shown · non-contrast
Comparison: None.

CLINICAL DATA: OG tube placement

EXAM:
PORTABLE ABDOMEN - 1 VIEW

[abdomen]
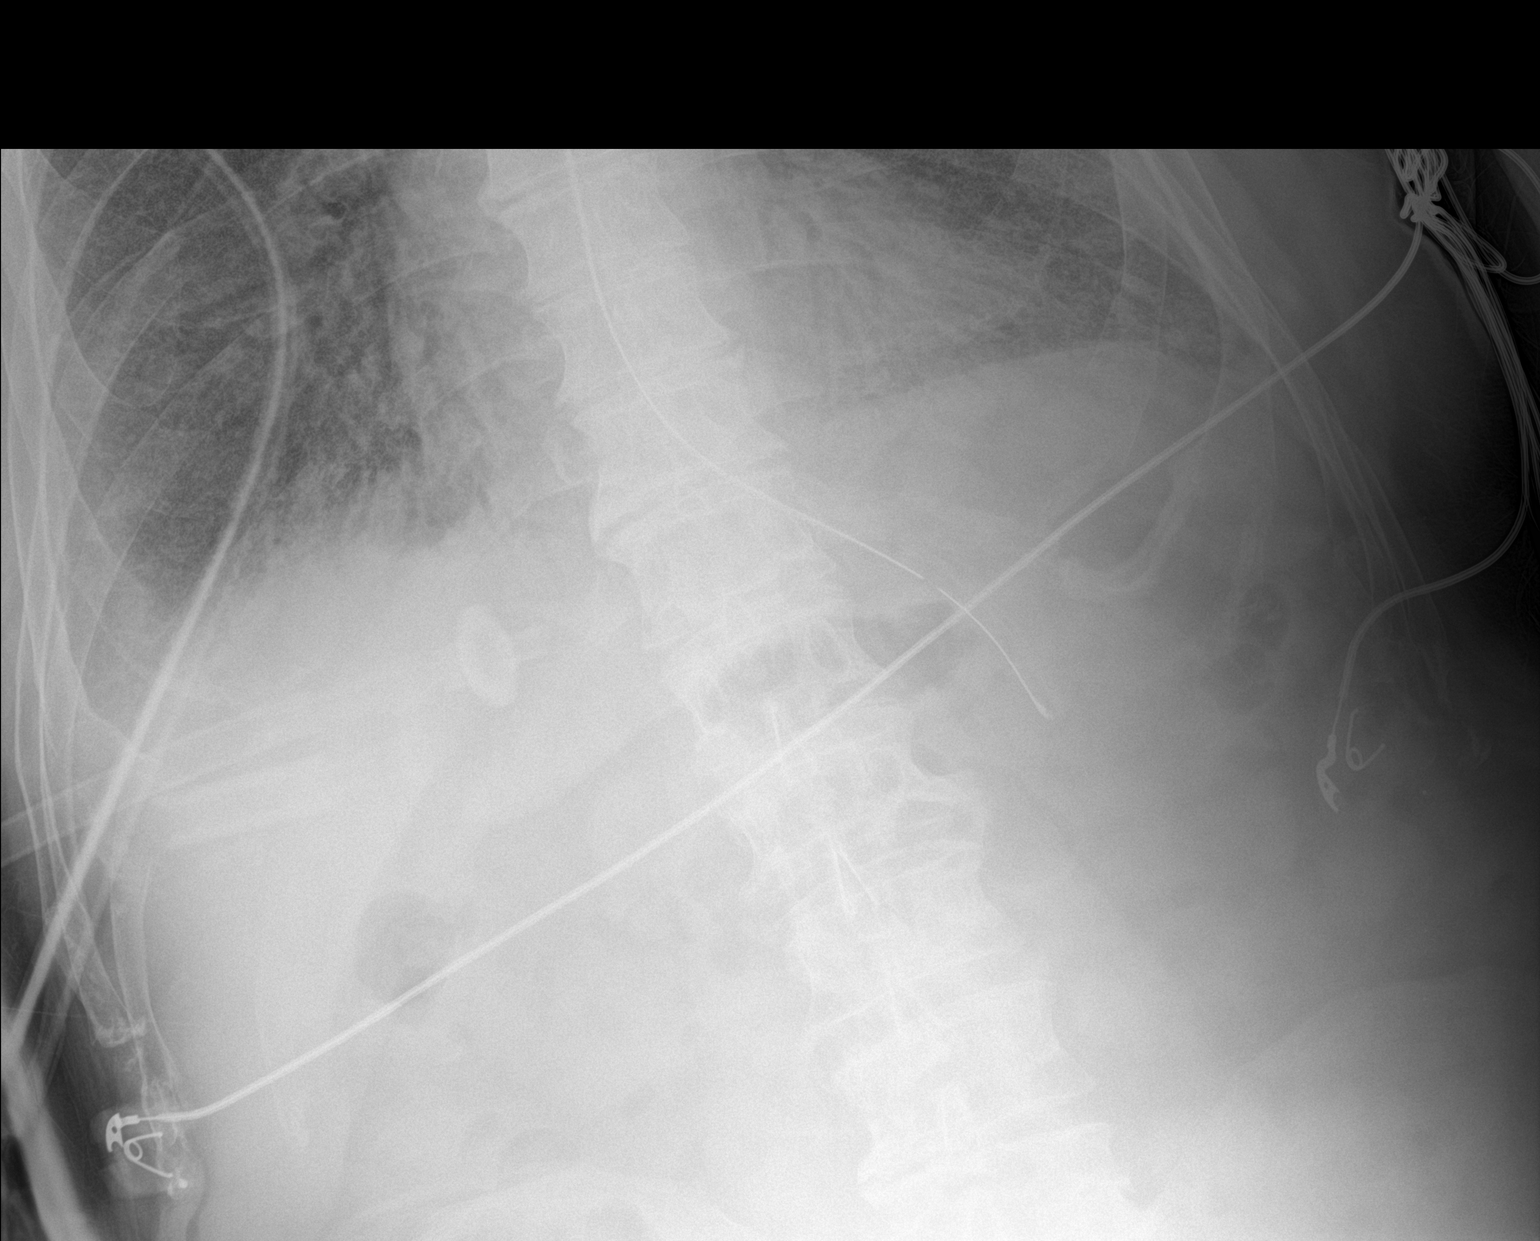

[1 of 1 positions shown; findings below may reference images not displayed]

FINDINGS: The OG tube tip projects over the gastric body. The tip is pointed
distally.
IMPRESSION: OG tube projects over the stomach.

## 2022-01-25 IMAGING — DX DG CHEST 1V PORT
2 series · 2 of 2 positions shown · non-contrast
Comparison: 09/29/2020

CLINICAL DATA: Evaluate ET tube placement.  COVID positive.

EXAM:
PORTABLE CHEST 1 VIEW

[chest ap (1 of 2)]
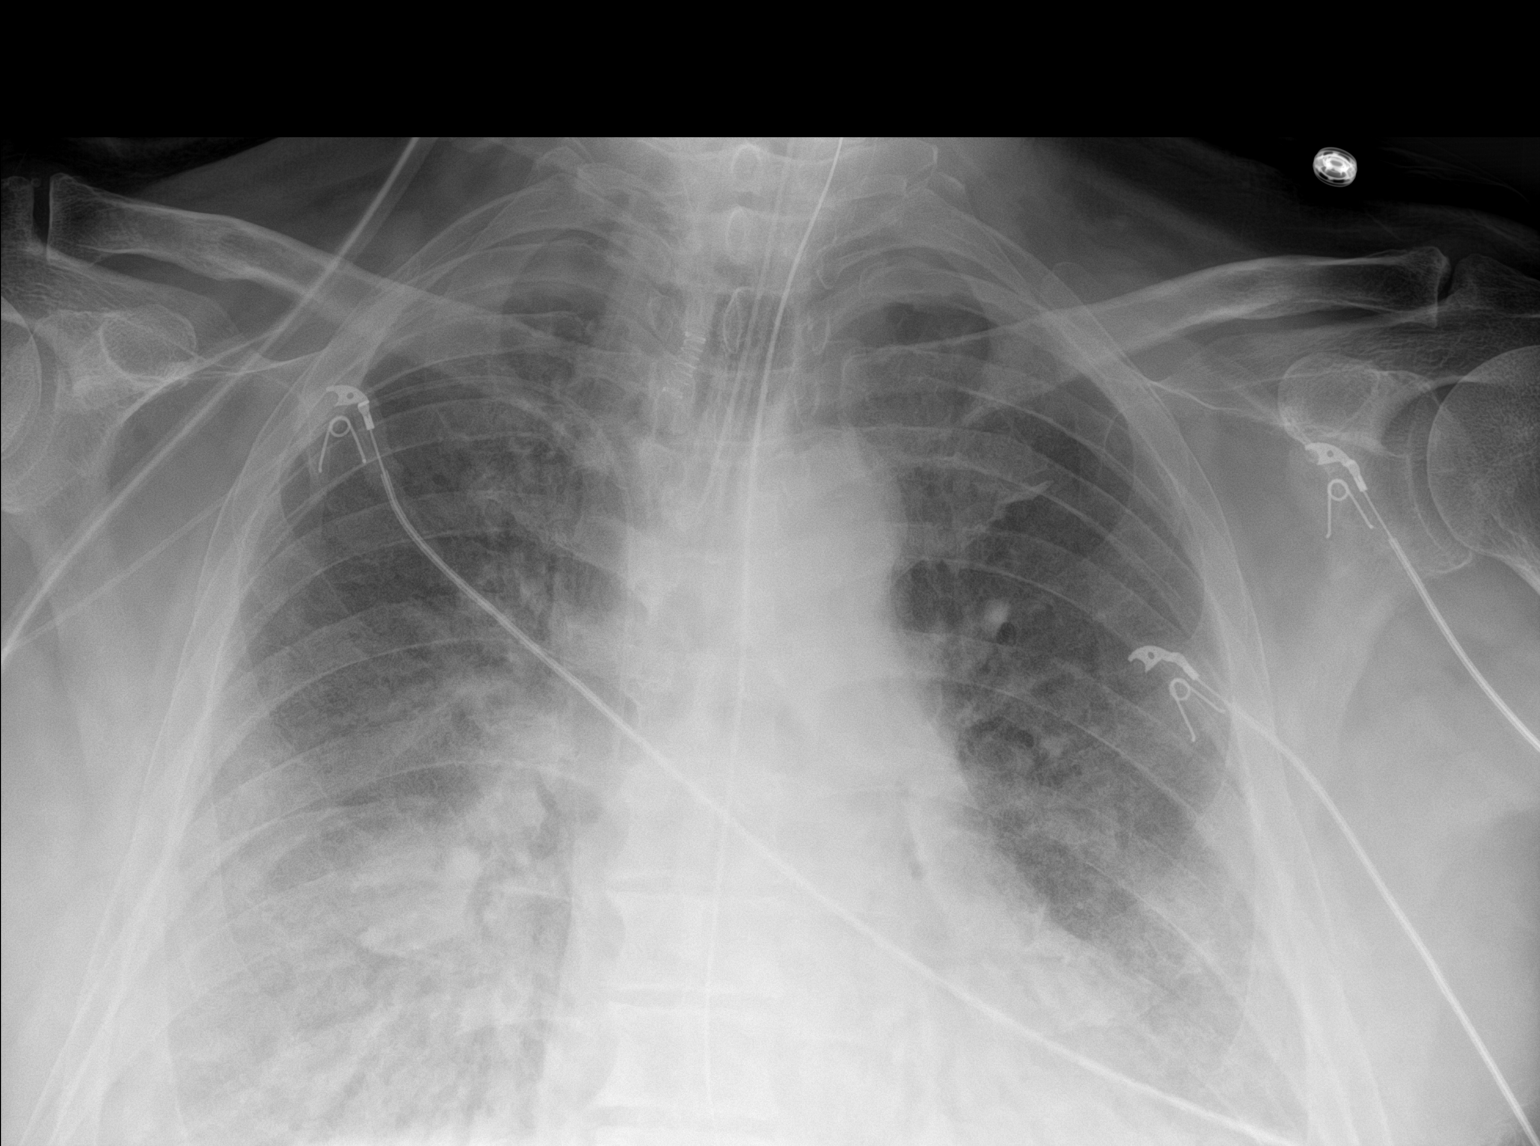

[chest ap (2 of 2)]
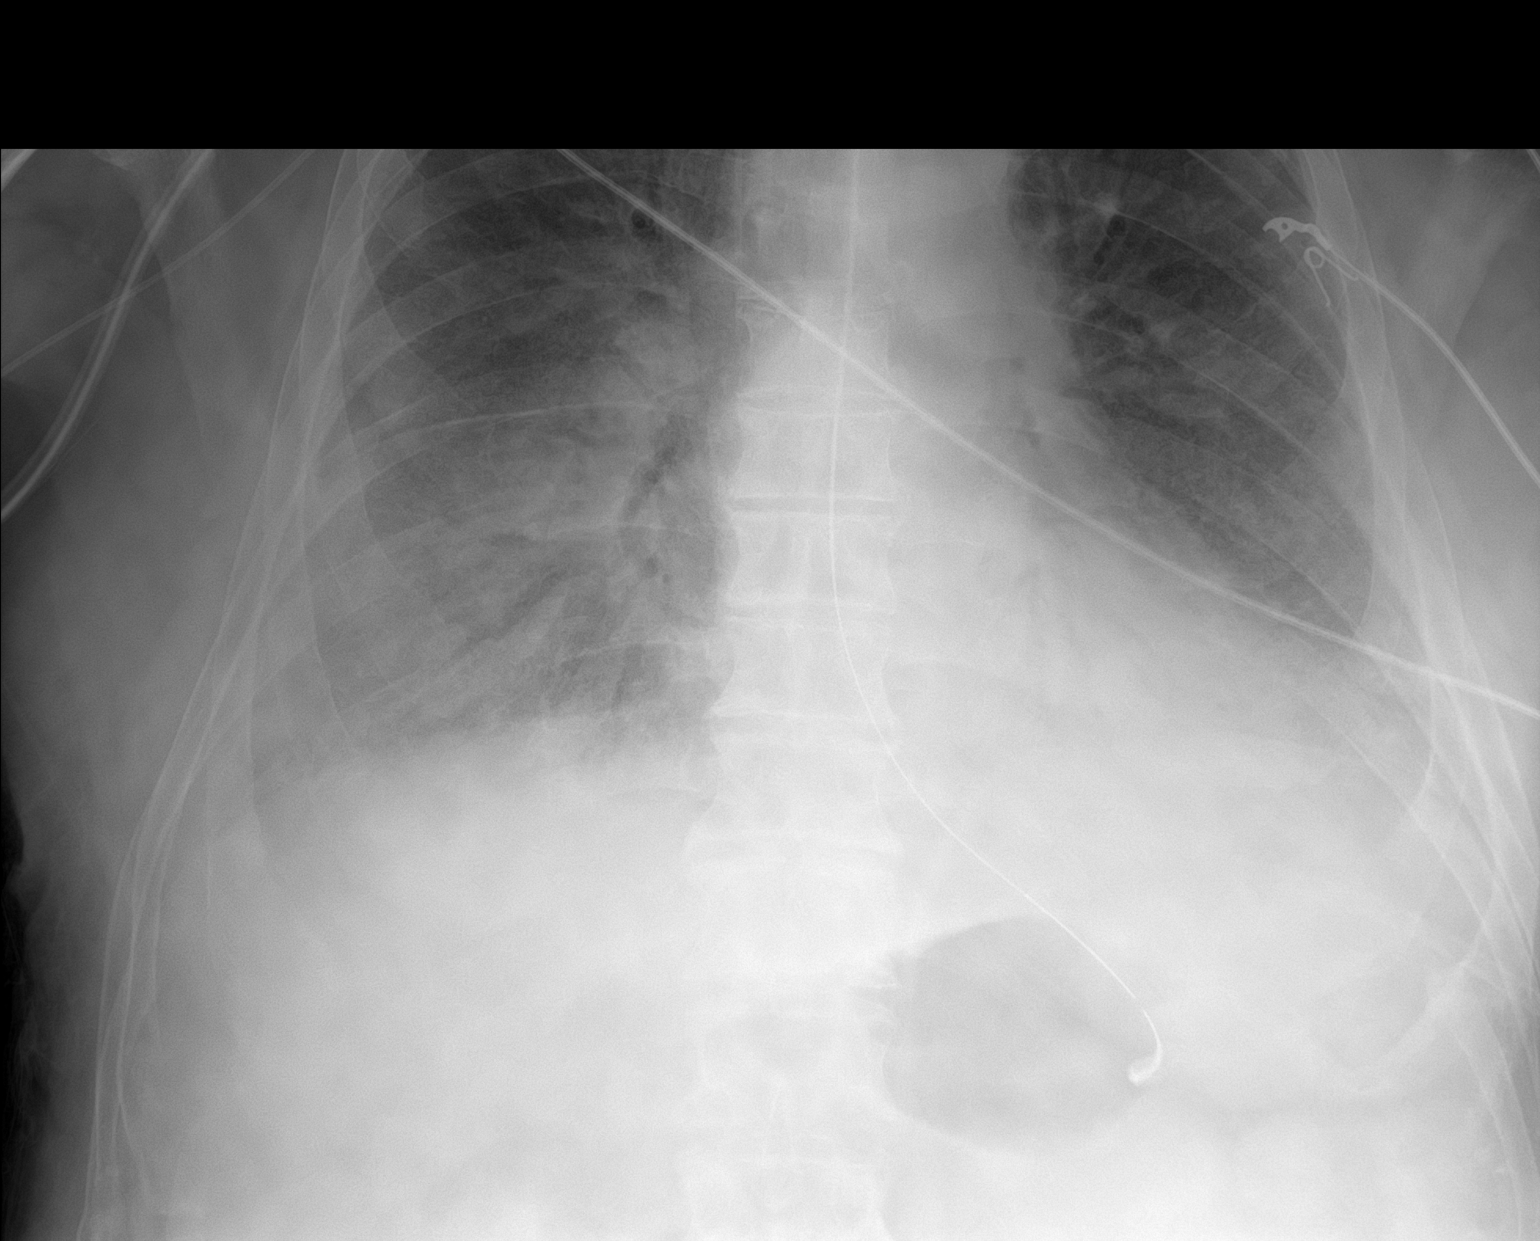

[2 of 2 positions shown; findings below may reference images not displayed]

FINDINGS: ET tube tip is in satisfactory position above the carina. There is
an NG tube with tip and side port below the GE junction. Right arm
PICC line tip is in the distal SVC. Stable cardiomediastinal
contours. Persistent and unchanged bilat lower lung zone predominant
interstitial and airspace opacities compatible with multifocal
pneumonia.
IMPRESSION: 1. Stable support apparatus.
2. Persistent bilateral lower lung zone predominant interstitial and
airspace opacities compatible with multifocal pneumonia.

## 2022-01-29 IMAGING — DX DG CHEST 1V PORT
1 series · 2 of 2 positions shown · non-contrast
Comparison: Portable exam 6415 hours compared to 1207 hours

CLINICAL DATA: Intubation, [id number]

EXAM:
PORTABLE CHEST 1 VIEW

[Series 1: chest · 0.14mm/px · 2 of 2 slices shown]
[im 1/2]
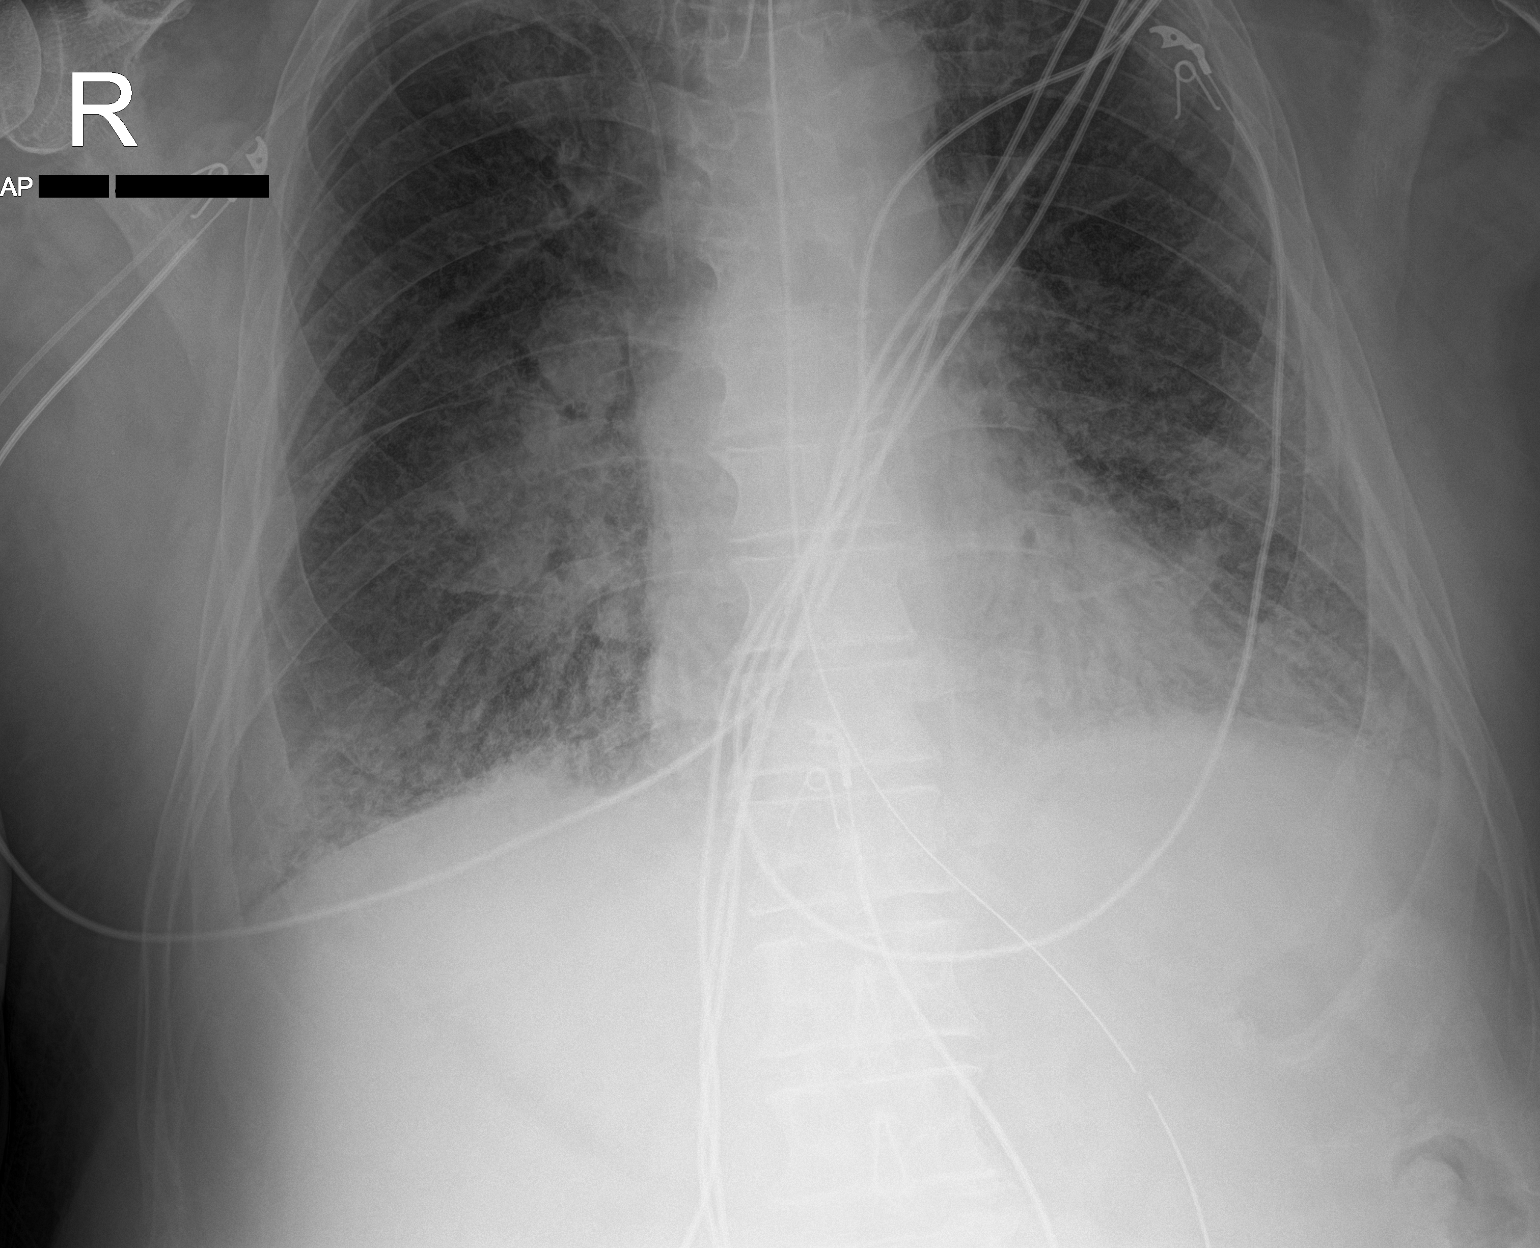
[im 2/2]
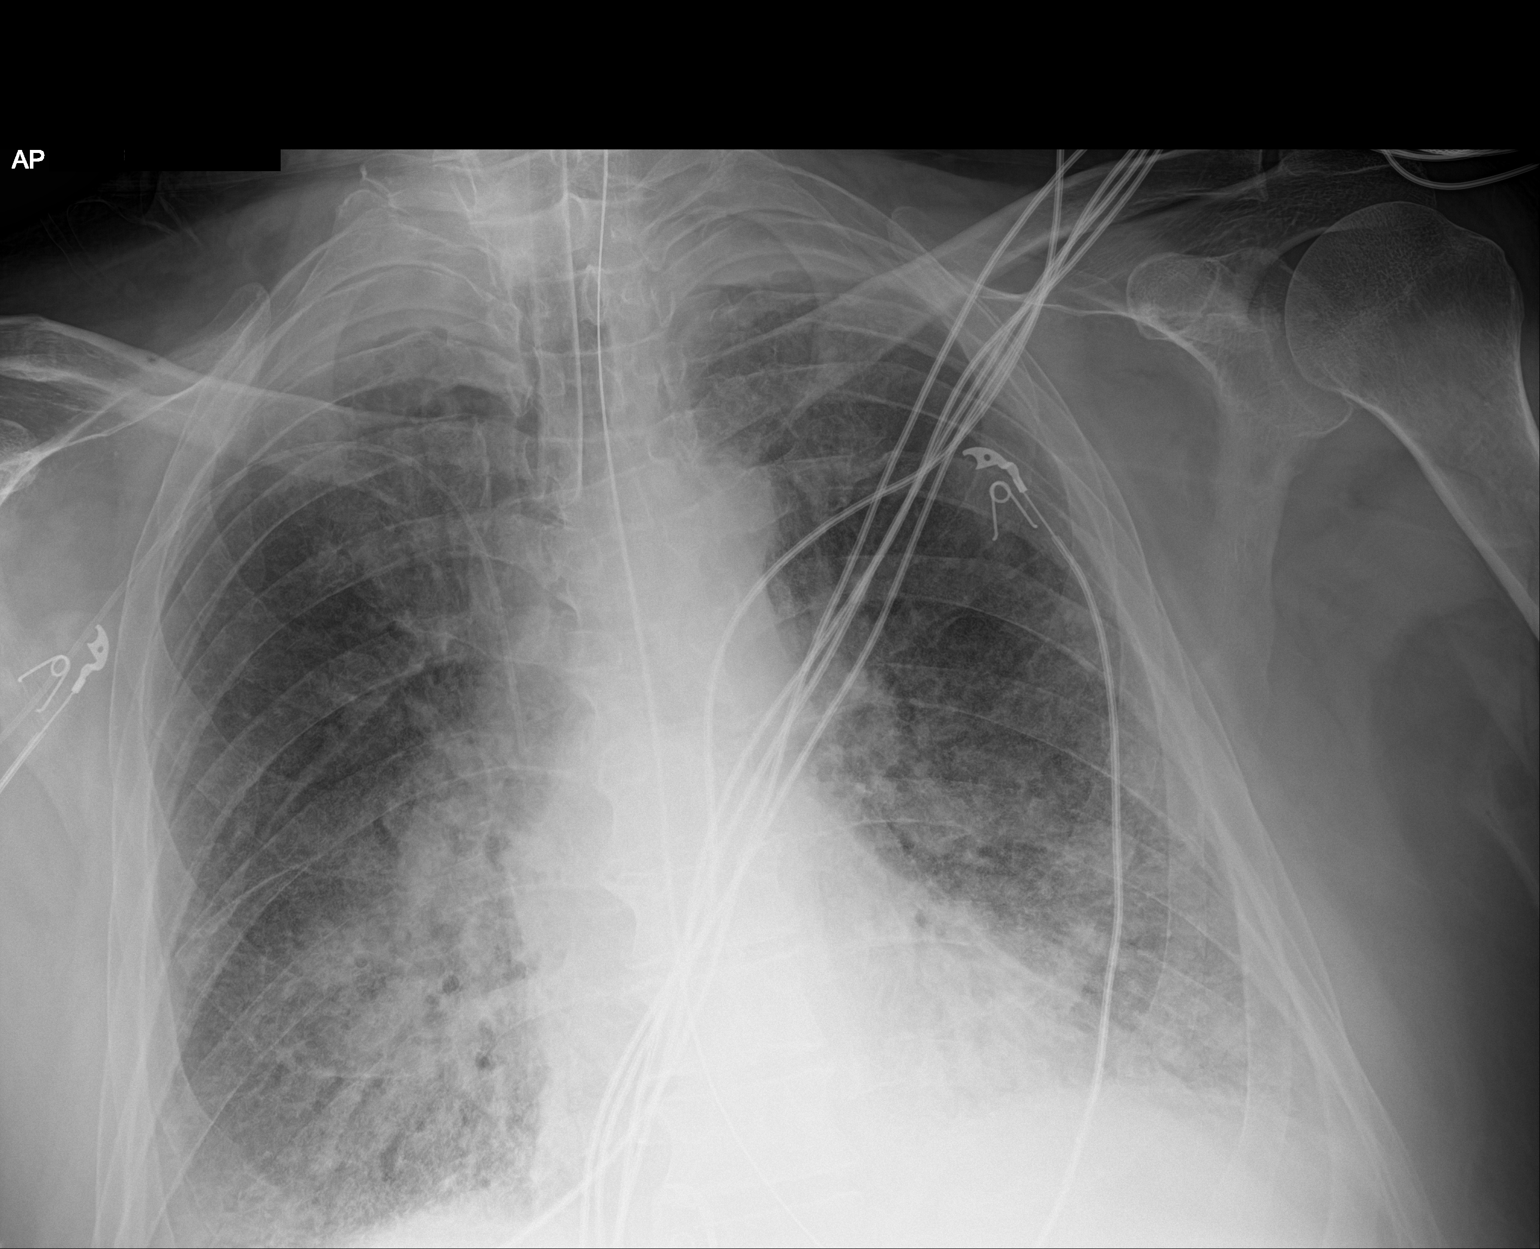

[2 of 2 positions shown; findings below may reference images not displayed]

FINDINGS: Tip of endotracheal tube projects 4.5 cm above carina.

Nasogastric tube extends into stomach.

RIGHT arm PICC line tip projects over SVC.

Normal heart size, mediastinal contours, and pulmonary vascularity.

Airspace infiltrates in the mid to lower lungs bilaterally
consistent with multifocal pneumonia and UWZG0-LS.

No pleural effusion or pneumothorax.

Osseous structures unremarkable.
IMPRESSION: Persistent BILATERAL pulmonary infiltrates consistent with
multifocal pneumonia and UWZG0-LS, with decreased atelectasis in
RIGHT middle lobe since previous exam.

## 2022-01-29 IMAGING — DX DG CHEST 1V PORT
1 series · 1 of 1 positions shown · non-contrast
Comparison: [DATE]

CLINICAL DATA: COVID pneumonia.

EXAM:
PORTABLE CHEST 1 VIEW

[chest]
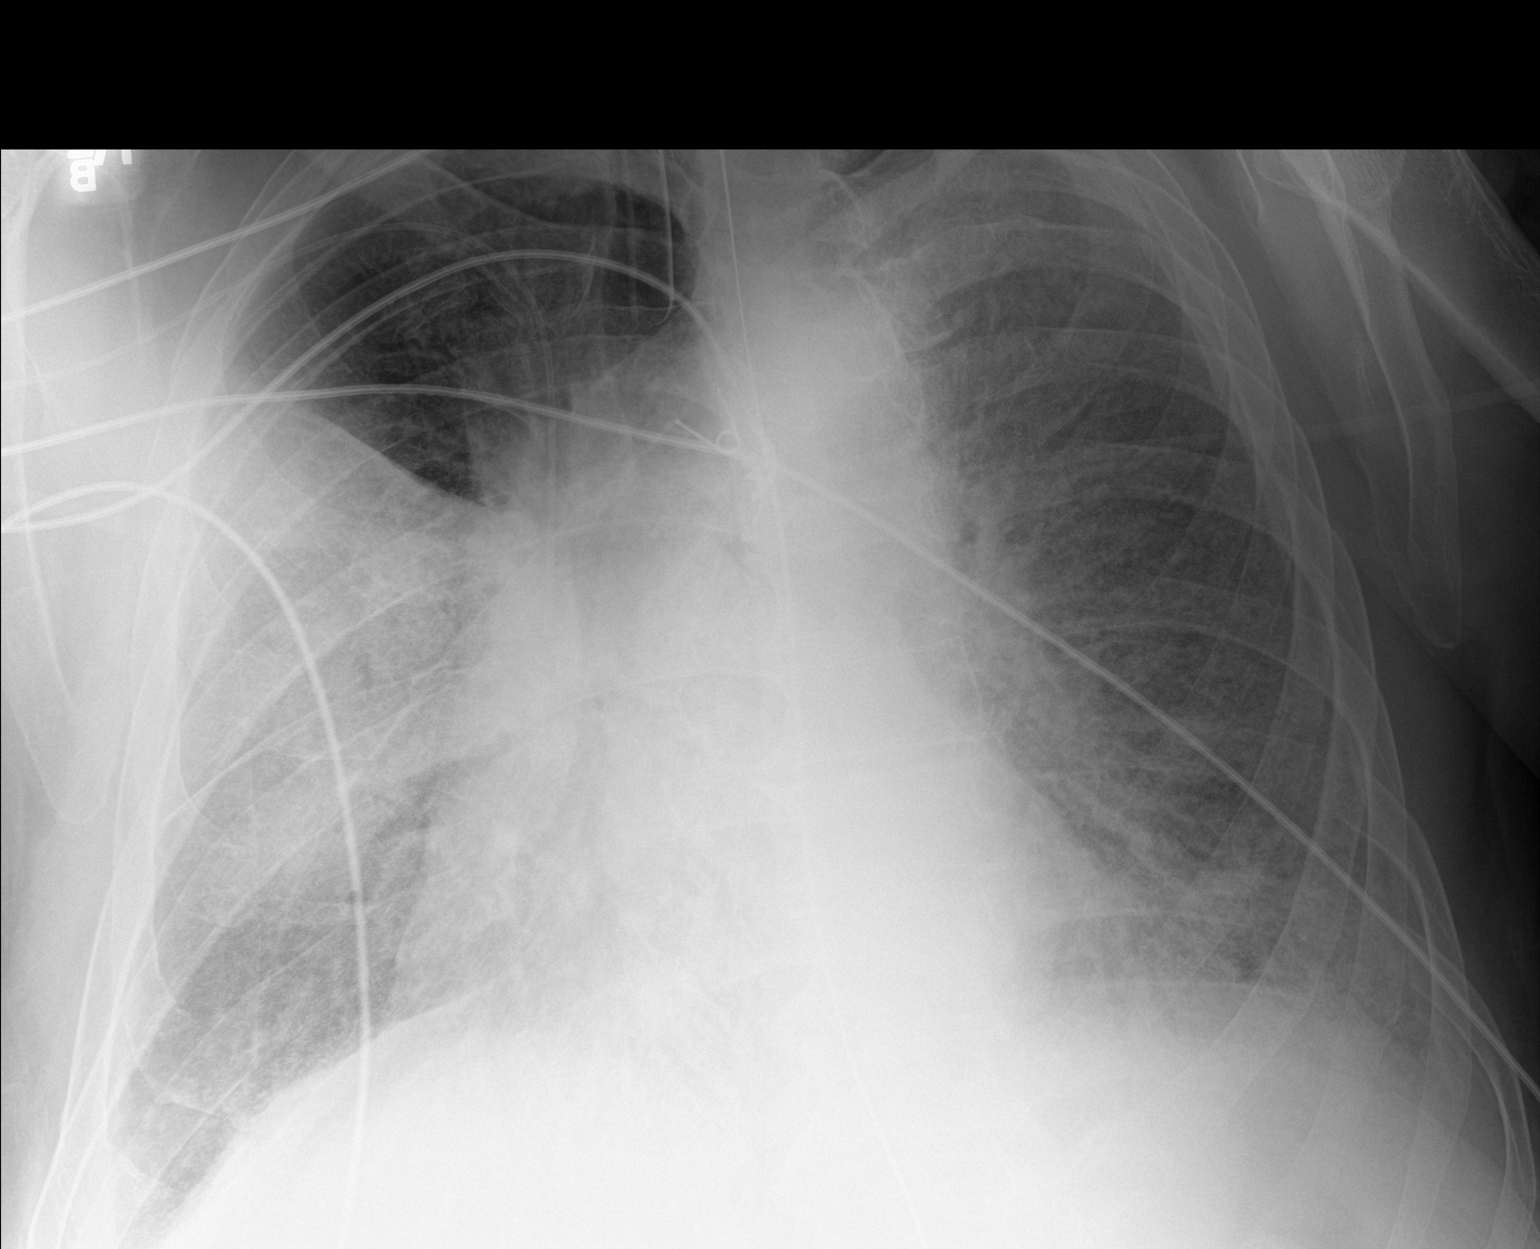

[1 of 1 positions shown; findings below may reference images not displayed]

FINDINGS: 9405 hours. Endotracheal tube tip is 6.2 cm above the base of the
carina. The NG tube passes into the stomach although the distal tip
position is not included on the film. Interval progression of
confluent ground-glass airspace disease in the right mid lung.
Background interstitial and hazy ground-glass lung opacity is
stable. No substantial pleural effusion. Telemetry leads overlie the
chest.
IMPRESSION: 1. Interval progression of confluent ground-glass airspace disease
in the right mid lung.
2. Stable interstitial and hazy ground-glass lung opacity
bilaterally.
